# Patient Record
Sex: Female | Born: 1940 | Race: White | Hispanic: No | Marital: Single | Smoking: Never smoker
Health system: Southern US, Community
[De-identification: ages and names within clinical notes are randomized; demographics above are authoritative.]

## PROBLEM LIST (undated history)

## (undated) DIAGNOSIS — D649 Anemia, unspecified: Secondary | ICD-10-CM

## (undated) DIAGNOSIS — A0472 Enterocolitis due to Clostridium difficile, not specified as recurrent: Secondary | ICD-10-CM

## (undated) DIAGNOSIS — D126 Benign neoplasm of colon, unspecified: Secondary | ICD-10-CM

## (undated) DIAGNOSIS — I10 Essential (primary) hypertension: Secondary | ICD-10-CM

## (undated) DIAGNOSIS — K589 Irritable bowel syndrome without diarrhea: Secondary | ICD-10-CM

## (undated) DIAGNOSIS — K222 Esophageal obstruction: Secondary | ICD-10-CM

## (undated) DIAGNOSIS — H269 Unspecified cataract: Secondary | ICD-10-CM

## (undated) DIAGNOSIS — A048 Other specified bacterial intestinal infections: Secondary | ICD-10-CM

## (undated) DIAGNOSIS — K52831 Collagenous colitis: Secondary | ICD-10-CM

## (undated) DIAGNOSIS — K449 Diaphragmatic hernia without obstruction or gangrene: Secondary | ICD-10-CM

## (undated) DIAGNOSIS — K219 Gastro-esophageal reflux disease without esophagitis: Secondary | ICD-10-CM

## (undated) DIAGNOSIS — E538 Deficiency of other specified B group vitamins: Secondary | ICD-10-CM

## (undated) DIAGNOSIS — IMO0002 Reserved for concepts with insufficient information to code with codable children: Secondary | ICD-10-CM

## (undated) DIAGNOSIS — K5790 Diverticulosis of intestine, part unspecified, without perforation or abscess without bleeding: Secondary | ICD-10-CM

## (undated) DIAGNOSIS — D509 Iron deficiency anemia, unspecified: Secondary | ICD-10-CM

## (undated) DIAGNOSIS — T7840XA Allergy, unspecified, initial encounter: Secondary | ICD-10-CM

## (undated) HISTORY — DX: Benign neoplasm of colon, unspecified: D12.6

## (undated) HISTORY — PX: EYE SURGERY: SHX253

## (undated) HISTORY — PX: BLADDER SUSPENSION: SHX72

## (undated) HISTORY — DX: Deficiency of other specified B group vitamins: E53.8

## (undated) HISTORY — DX: Esophageal obstruction: K22.2

## (undated) HISTORY — DX: Reserved for concepts with insufficient information to code with codable children: IMO0002

## (undated) HISTORY — DX: Anemia, unspecified: D64.9

## (undated) HISTORY — DX: Irritable bowel syndrome, unspecified: K58.9

## (undated) HISTORY — PX: VAGINAL HYSTERECTOMY: SHX2639

## (undated) HISTORY — DX: Allergy, unspecified, initial encounter: T78.40XA

## (undated) HISTORY — DX: Diaphragmatic hernia without obstruction or gangrene: K44.9

## (undated) HISTORY — DX: Iron deficiency anemia, unspecified: D50.9

## (undated) HISTORY — DX: Gastro-esophageal reflux disease without esophagitis: K21.9

## (undated) HISTORY — DX: Diverticulosis of intestine, part unspecified, without perforation or abscess without bleeding: K57.90

## (undated) HISTORY — DX: Collagenous colitis: K52.831

## (undated) HISTORY — PX: CHOLECYSTECTOMY: SHX55

## (undated) HISTORY — DX: Other specified bacterial intestinal infections: A04.8

## (undated) HISTORY — DX: Unspecified cataract: H26.9

## (undated) HISTORY — PX: APPENDECTOMY: SHX54

---

## 1986-07-23 DIAGNOSIS — K222 Esophageal obstruction: Secondary | ICD-10-CM

## 1986-07-23 HISTORY — DX: Esophageal obstruction: K22.2

## 1998-01-04 ENCOUNTER — Ambulatory Visit (HOSPITAL_COMMUNITY): Admission: RE | Admit: 1998-01-04 | Discharge: 1998-01-04 | Payer: Self-pay | Admitting: Gastroenterology

## 1998-07-19 ENCOUNTER — Other Ambulatory Visit: Admission: RE | Admit: 1998-07-19 | Discharge: 1998-07-19 | Payer: Self-pay | Admitting: Family Medicine

## 2000-10-15 ENCOUNTER — Other Ambulatory Visit: Admission: RE | Admit: 2000-10-15 | Discharge: 2000-10-15 | Payer: Self-pay | Admitting: Obstetrics and Gynecology

## 2002-07-23 DIAGNOSIS — K52831 Collagenous colitis: Secondary | ICD-10-CM

## 2002-07-23 HISTORY — DX: Collagenous colitis: K52.831

## 2002-08-06 ENCOUNTER — Encounter (INDEPENDENT_AMBULATORY_CARE_PROVIDER_SITE_OTHER): Payer: Self-pay | Admitting: *Deleted

## 2002-08-06 ENCOUNTER — Ambulatory Visit (HOSPITAL_COMMUNITY): Admission: RE | Admit: 2002-08-06 | Discharge: 2002-08-06 | Payer: Self-pay | Admitting: Gastroenterology

## 2002-11-13 ENCOUNTER — Encounter: Payer: Self-pay | Admitting: Gastroenterology

## 2002-11-13 ENCOUNTER — Encounter: Admission: RE | Admit: 2002-11-13 | Discharge: 2002-11-13 | Payer: Self-pay | Admitting: Gastroenterology

## 2003-12-10 ENCOUNTER — Other Ambulatory Visit: Admission: RE | Admit: 2003-12-10 | Discharge: 2003-12-10 | Payer: Self-pay | Admitting: Obstetrics and Gynecology

## 2004-04-27 ENCOUNTER — Ambulatory Visit (HOSPITAL_COMMUNITY): Admission: RE | Admit: 2004-04-27 | Discharge: 2004-04-27 | Payer: Self-pay | Admitting: Gastroenterology

## 2004-04-27 ENCOUNTER — Encounter (INDEPENDENT_AMBULATORY_CARE_PROVIDER_SITE_OTHER): Payer: Self-pay | Admitting: *Deleted

## 2005-11-12 ENCOUNTER — Encounter: Admission: RE | Admit: 2005-11-12 | Discharge: 2005-11-12 | Payer: Self-pay | Admitting: Gastroenterology

## 2007-05-01 IMAGING — CR DG KNEE 1-2V*R*
2 series · 2 of 2 positions shown · non-contrast
Comparison: none

CLINICAL DATA: Pain.
 RIGHT KNEE ? 2 VIEW:

[view not recorded (1 of 2)]
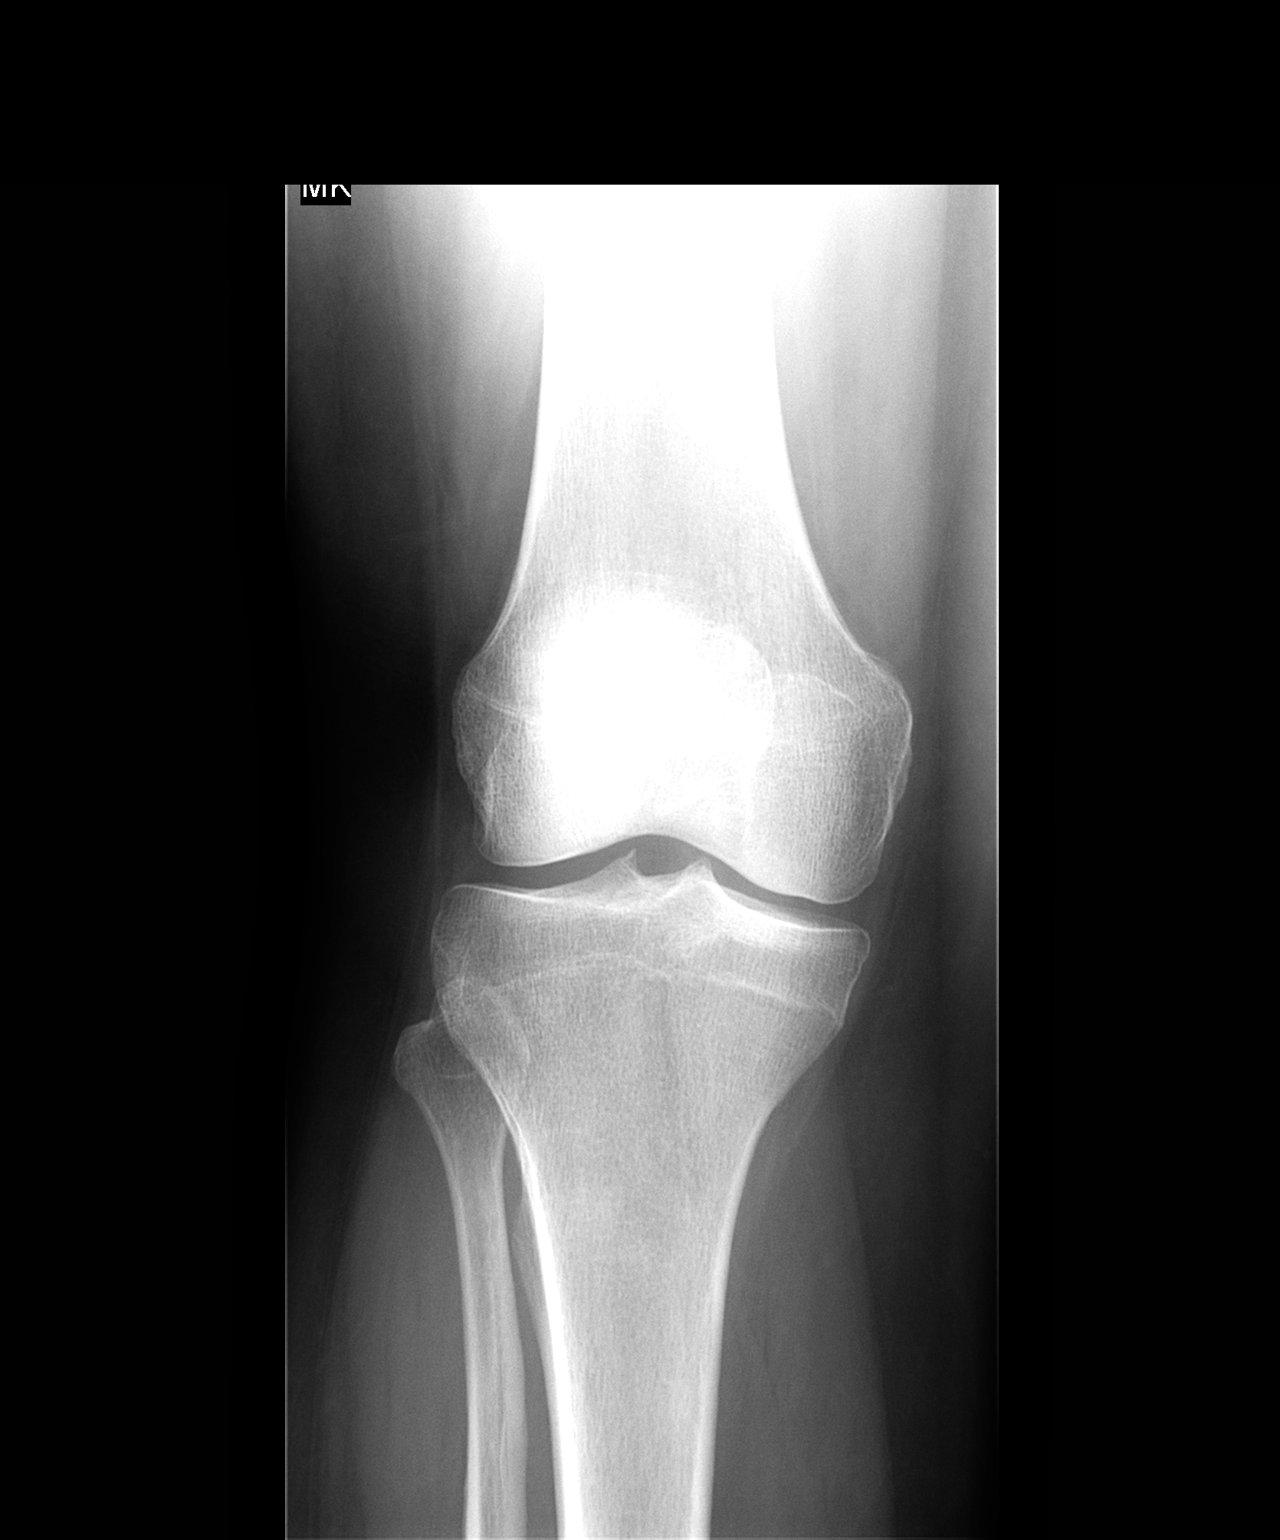

[view not recorded (2 of 2)]
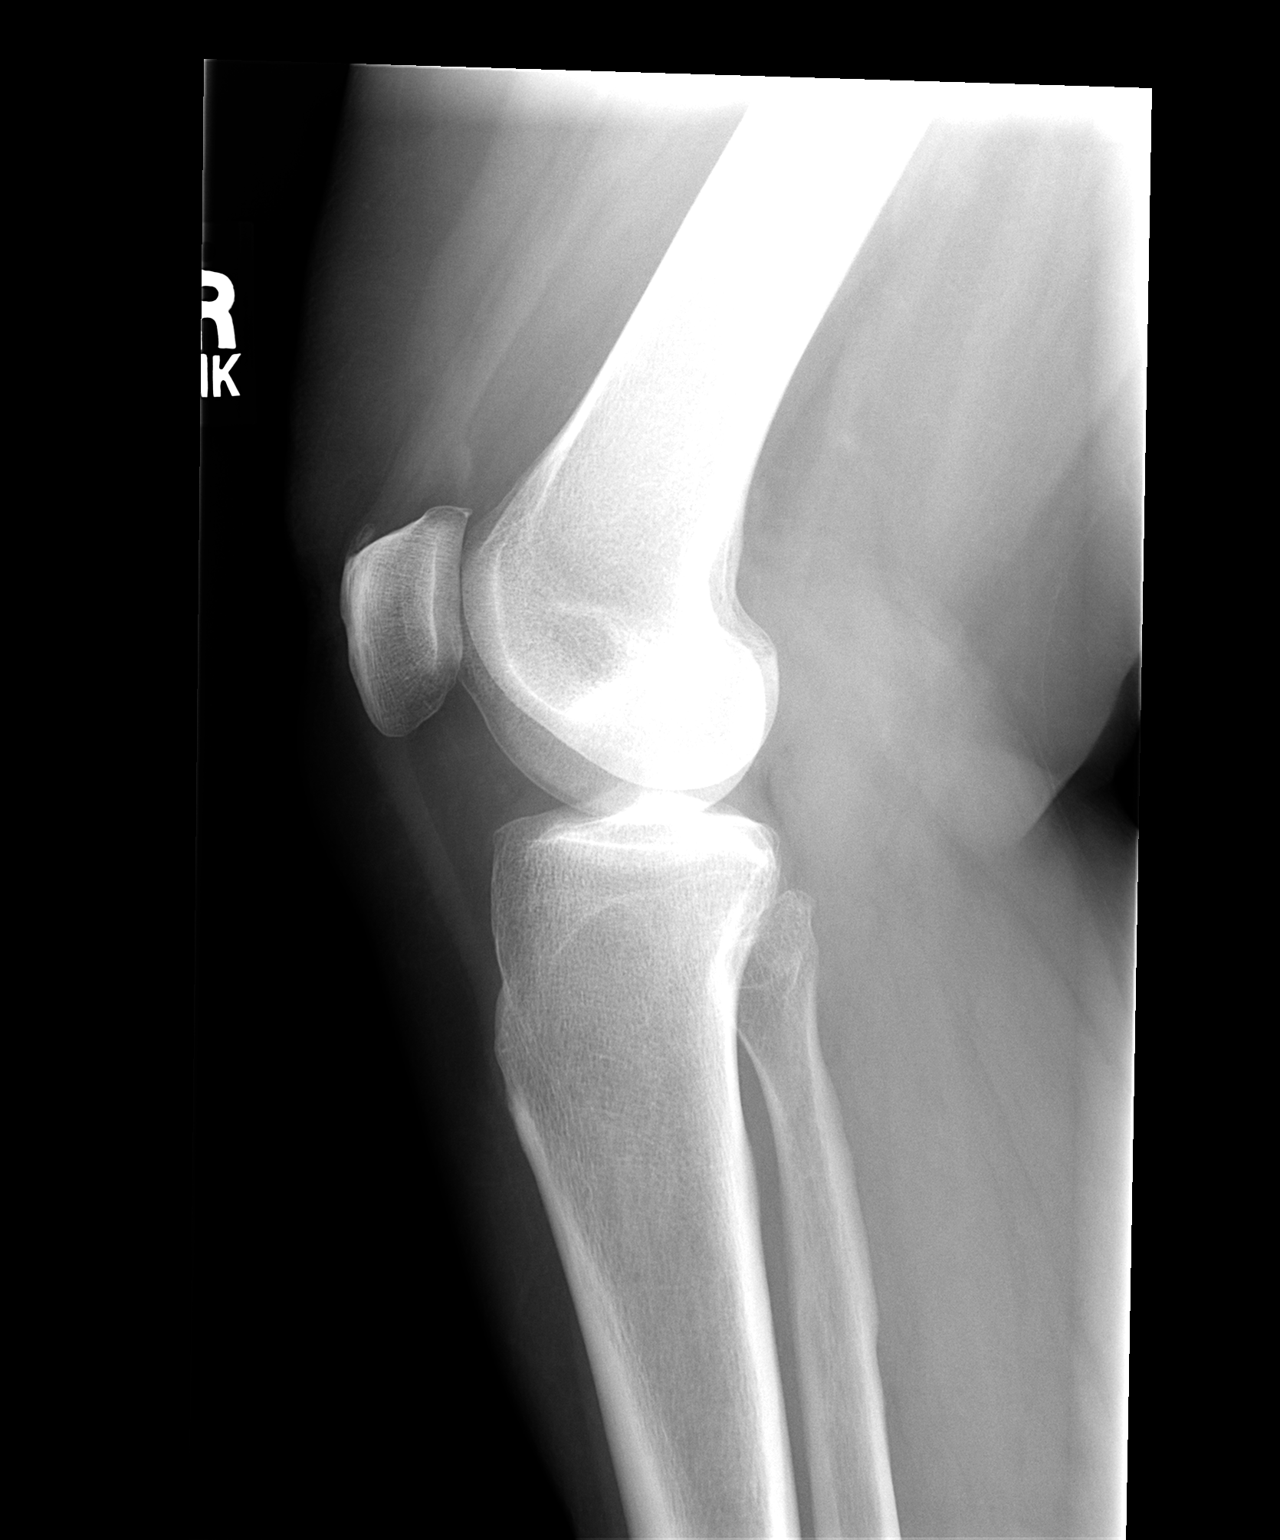

[2 of 2 positions shown; findings below may reference images not displayed]

FINDINGS: The joint space is adequately maintained, and there are no erosive or destructive changes.  There is no evidence for joint effusion.
IMPRESSION: Normal study.

## 2008-12-18 ENCOUNTER — Emergency Department (HOSPITAL_COMMUNITY): Admission: EM | Admit: 2008-12-18 | Discharge: 2008-12-18 | Payer: Self-pay | Admitting: Emergency Medicine

## 2010-08-16 ENCOUNTER — Other Ambulatory Visit: Payer: Self-pay | Admitting: Obstetrics and Gynecology

## 2010-12-08 NOTE — Op Note (Signed)
NAME:  Jasmine Cantu, Jasmine Cantu                            ACCOUNT NO.:  [id number]   MEDICAL RECORD NO.:  [id number]                   PATIENT TYPE:  AMB   LOCATION:  ENDO                                 FACILITY:  [facility]   PHYSICIAN:  Danise Edge, M.D.                DATE OF BIRTH:  [birth year]   DATE OF PROCEDURE:  08/06/2002  DATE OF DISCHARGE:                                 OPERATIVE REPORT   PROCEDURE PERFORMED:  Colonoscopy.   ENDOSCOPIST:  Charolett Bumpers, M.D.   INDICATIONS FOR PROCEDURE:  The patient is a 70 year old female born  1940/10/02.  The patient has diarrhea predominant irritable bowel  syndrome and is post cholecystectomy.  Her mother had colon cancer.  [name] last underwent a screening colonoscopy in 1999.  She has never required  removal of colon cancer or neoplastic colon polyps.  She is due for a  screening colonoscopy with polypectomy to prevent colon cancer.  She does  experience intermittent right upper quadrant abdominal discomfort.  Her  diarrhea predominant irritable bowel syndrome has recently become more  constipated and has more constipated bowel movements.  She denies  gastrointestinal bleeding.   PREMEDICATION:  Demerol 50 mg, Versed 5 mg.   INSTRUMENT USED:  Pediatric Olympus colonoscope.   DESCRIPTION OF PROCEDURE:  After obtaining informed consent, the patient was  placed in the left lateral decubitus position.  I administered intravenous  Demerol and intravenous Versed to achieve conscious sedation for the  procedure.  The patient's blood pressure, oxygen saturations and cardiac  rhythm were monitored throughout the procedure and documented in the medical  record.   Anal inspection was normal.  Digital rectal exam was normal.  The pediatric  Olympus video colonoscope was introduced into the rectum and advanced to the  cecum.  Colonic preparation for the exam today was excellent.   Rectum:  Normal.   Sigmoid colon and descending  colon:  Extensive left colonic diverticulosis.   Splenic flexure:  Normal.   Transverse colon:  Normal.   Hepatic flexure:  Normal.   Ascending colon:  Normal.   Cecum and ileocecal valve:  Normal.   Biopsies:  Random colonic biopsies were taken from the right colon to rule  out microscopic-collagenous colitis.   ASSESSMENT:  Left colonic diverticulosis; otherwise, normal  proctocolonoscopy to the cecum.  No endoscopic evidence for the presence of  colorectal neoplasia.   RECOMMENDATIONS:  Repeat screening colonoscopy with polypectomy to prevent  colon cancer in five years.                                                 Danise Edge, M.D.    MJ/MEDQ  D:  08/06/2002  T:  08/06/2002  Job:  [id number]  

## 2010-12-08 NOTE — Op Note (Signed)
NAMEJOCABED, CHEESE                  ACCOUNT NO.:  [id number]   MEDICAL RECORD NO.:  [id number]          PATIENT TYPE:  AMB   LOCATION:  ENDO                         FACILITY:  [facility]   PHYSICIAN:  Danise Edge, M.D.   DATE OF BIRTH:  1940/10/30   DATE OF PROCEDURE:  04/27/2004  DATE OF DISCHARGE:                                 OPERATIVE REPORT   PROCEDURE:  Esophagogastroduodenoscopy.   INDICATIONS:  [name] is a 70 year old female born May 18, 1941.  [name] has guaiac positive stool and iron-deficiency anemia based on a  hemoglobin 10.4, serum ferritin 6.8 nanograms/mL, iron saturation 8%.   In 1988, 1994, 1999, and 2004, screening proctocolonoscopy to the cecum did  not reveal colonic neoplasia. 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   [name] required upper endoscopy and esophageal dilation of a benign peptic  stricture in 1988 and in 1997. There is no history of Barrett's esophagus.   In 1992, an upper GI small-bowel follow through x-ray series was normal  except for the presence of a hiatal hernia.   [name] admitted stool cards for hemoccult testing March 21, 2004; 6 of 6  stool card windows were positive for blood.   ENDOSCOPIST:  Danise Edge, M.D.   PREMEDICATION:  Versed 7.5 mg, Demerol 50 mg.   PROCEDURE NOTE:  After obtaining informed consent, [name] was placed in  the left lateral decubitus position. I administered intravenous Demerol and  intravenous Versed to achieve conscious sedation for the procedure. The  patient's blood pressure, oxygen saturation, and cardiac rhythm were  monitored throughout the procedure and documented in the medical record.   The Olympus gastric scope was passed through the posterior hypopharynx into  the proximal esophagus without difficulty. Hypopharynx, larynx, and vocal  cords appeared normal.   Esophagoscopy:  The proximal mid and lower segments of the esophageal mucosa  appeared  normal except for the presence of a nonobstructing benign peptic  stricture localized to the esophagogastric junction which is noted at 30 cm  from the incisor teeth. There is no endoscopic evidence for the presence of  Barrett's esophagus or erosive esophagitis.   Gastroscopy:  [name] diaphragmatic hiatus is noted at 35 cm from the  incisor teeth. She does have a large hiatal hernia. 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 An erosion with some depth to it was biopsied. There is no active  bleeding or visible vessels present. The diaphragmatic hiatus is quite  patulous. The gastric body, antrum, and pylorus appeared normal.   Duodenoscopy:  The duodenal bulb, mid duodenum, and distal duodenum appear  completely normal.   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 An erosion that appeared to have some depth to it was  biopsied but certainly does not look malignant.   PLAN:  [name] will start her iron gluconate; she was unable to tolerate  iron  sulfate. She will remain on her protein pump inhibitor.      MJ/MEDQ  D:  04/27/2004  T:  04/27/2004  Job:  [id number]

## 2012-03-11 ENCOUNTER — Other Ambulatory Visit: Payer: Self-pay | Admitting: Gastroenterology

## 2012-05-31 ENCOUNTER — Ambulatory Visit (INDEPENDENT_AMBULATORY_CARE_PROVIDER_SITE_OTHER): Payer: Medicare Other | Admitting: Emergency Medicine

## 2012-05-31 VITALS — BP 136/94 | HR 82 | Temp 98.1°F | Resp 18 | Ht 63.0 in | Wt 190.0 lb

## 2012-05-31 DIAGNOSIS — J309 Allergic rhinitis, unspecified: Secondary | ICD-10-CM

## 2012-05-31 DIAGNOSIS — IMO0001 Reserved for inherently not codable concepts without codable children: Secondary | ICD-10-CM

## 2012-05-31 DIAGNOSIS — J329 Chronic sinusitis, unspecified: Secondary | ICD-10-CM

## 2012-05-31 DIAGNOSIS — K219 Gastro-esophageal reflux disease without esophagitis: Secondary | ICD-10-CM

## 2012-05-31 DIAGNOSIS — J029 Acute pharyngitis, unspecified: Secondary | ICD-10-CM

## 2012-05-31 LAB — POCT RAPID STREP A (OFFICE): Rapid Strep A Screen: NEGATIVE

## 2012-05-31 MED ORDER — AMOXICILLIN 875 MG PO TABS: 875.0000 mg | ORAL_TABLET | Freq: Two times a day (BID) | ORAL | Status: DC

## 2012-05-31 MED ORDER — PREDNISONE 10 MG PO TABS: ORAL_TABLET | ORAL | Status: DC

## 2012-05-31 MED ORDER — FLUTICASONE PROPIONATE 50 MCG/ACT NA SUSP: 2.0000 | Freq: Every day | NASAL | Status: DC

## 2012-05-31 NOTE — Progress Notes (Signed)
Pt presents to clinic with cold like symptoms for about a week. Pt states she does not have a productive cough but does feel drainage down the back of her throat. She also states her teeth are also a little sore. She reports taking Claritin 5mg  and Tylenol this past week. She states that the her husband is suffering from the same type symptoms at home. She is feeling a good bit of facial pressure he is also hurting in both of her cheeks .       Objective  HEENT exam reveals TMs to be clear nose congested throat normal except for redness of the posterior pharynx chest was clear to auscultation and percussion. Ingestion with some purulence noted on examination.   No results found for this or any previous visit.  Results for orders placed in visit on 05/31/12  POCT RAPID STREP A (OFFICE)      Component Value Range   Rapid Strep A Screen Negative  Negative    Assessment and plan  Patient here with bad sore throat and evidence of sinusitis. We'll treat with Flonase spray and amoxicillin.

## 2012-05-31 NOTE — Patient Instructions (Addendum)

## 2012-06-04 ENCOUNTER — Telehealth: Payer: Self-pay

## 2012-06-04 MED ORDER — BENZONATATE 100 MG PO CAPS: 100.0000 mg | ORAL_CAPSULE | Freq: Three times a day (TID) | ORAL | Status: DC | PRN

## 2012-06-04 NOTE — Telephone Encounter (Signed)
Patient states Dr Cleta Alberts did not give her this, was given by Dr Laural Benes her PCP gave to her about a year ago. Wants to know if we can give this to her. She is taking the Amox and the nasal spray.

## 2012-06-04 NOTE — Telephone Encounter (Signed)
Pt called and has a cough and is out of the cough medicine that she had been using since her visit with Dr. Cleta Alberts on 05/31/12.  Would like refill on Tussinex  Hydrocodone suspension.  1 tsp every 12 hours.  Best number [phone].  [facility] [address]

## 2012-06-04 NOTE — Telephone Encounter (Signed)
I do not feel comfortable prescribing that medication, however I will send her in some Tessalon perles.

## 2012-06-04 NOTE — Telephone Encounter (Signed)
Called patient to advise  °

## 2013-01-03 ENCOUNTER — Ambulatory Visit (INDEPENDENT_AMBULATORY_CARE_PROVIDER_SITE_OTHER): Payer: Medicare Other | Admitting: Family Medicine

## 2013-01-03 VITALS — BP 146/77 | HR 67 | Temp 97.5°F | Resp 16 | Ht 63.0 in | Wt 188.8 lb

## 2013-01-03 DIAGNOSIS — J329 Chronic sinusitis, unspecified: Secondary | ICD-10-CM

## 2013-01-03 MED ORDER — AZITHROMYCIN 250 MG PO TABS: ORAL_TABLET | ORAL | Status: DC

## 2013-01-03 NOTE — Progress Notes (Signed)
Patient ID: Jasmine Cantu MRN: [id number], DOB: Jun 01, 1941, 72 y.o. Date of Encounter: 01/03/2013, 11:19 AM  Primary Physician: Charolett Bumpers, MD  Chief Complaint:  Chief Complaint  Patient presents with  . Sinusitis    x 2 days     HPI: 72 y.o. year old female presents with 5 day history of nasal congestion, post nasal drip, sore throat, sinus pressure, and cough. Afebrile. No chills. Nasal congestion thick and green/yellow. Sinus pressure is the worst symptom. Cough is productive secondary to post nasal drip and not associated with time of day. Ears feel full, leading to sensation of muffled hearing. Has tried OTC cold preps without success. No GI complaints.   No recent antibiotics, recent travels, or sick contacts   No leg trauma, sedentary periods, h/o cancer, or tobacco use.  Past Medical History  Diagnosis Date  . GERD (gastroesophageal reflux disease)   . Cataract   . Ulcer      Home Meds: Prior to Admission medications   Medication Sig Start Date End Date Taking? Authorizing Provider  esomeprazole (NEXIUM) 40 MG capsule Take 40 mg by mouth daily before breakfast.   Yes Historical Provider, MD  fluticasone (FLONASE) 50 MCG/ACT nasal spray Place 2 sprays into the nose daily. 05/31/12  Yes Collene Gobble, MD  zolpidem (AMBIEN) 5 MG tablet Take 5 mg by mouth at bedtime as needed for sleep.   Yes Historical Provider, MD  azithromycin (ZITHROMAX Z-PAK) 250 MG tablet Take as directed on pack 01/03/13   Elvina Sidle, MD    Allergies: No Known Allergies  History   Social History  . Marital Status: Single    Spouse Name: N/A    Number of Children: N/A  . Years of Education: N/A   Occupational History  . Not on file.   Social History Main Topics  . Smoking status: Never Smoker   . Smokeless tobacco: Not on file  . Alcohol Use: No  . Drug Use: No  . Sexually Active: Yes   Other Topics Concern  . Not on file   Social History Narrative  . No narrative on file      Review of Systems: Constitutional: negative for chills, fever, night sweats or weight changes Cardiovascular: negative for chest pain or palpitations Respiratory: negative for hemoptysis, wheezing, or shortness of breath Abdominal: negative for abdominal pain, nausea, vomiting or diarrhea Dermatological: negative for rash Neurologic: negative for headache   Physical Exam: Blood pressure 146/77, pulse 67, temperature 97.5 F (36.4 C), temperature source Oral, resp. rate 16, height 5\' 3"  (1.6 m), weight 188 lb 12.8 oz (85.639 kg), SpO2 99.00%., Body mass index is 33.45 kg/(m^2). General: Well developed, well nourished, in no acute distress. Head: Normocephalic, atraumatic, eyes without discharge, sclera non-icteric, nares are congested. Bilateral auditory canals clear, TM's are without perforation, pearly grey with reflective cone of light bilaterally. Serous effusion bilaterally behind TM's. Maxillary sinus TTP. Oral cavity moist, dentition normal. Posterior pharynx with post nasal drip and mild erythema. No peritonsillar abscess or tonsillar exudate. Neck: Supple. No thyromegaly. Full ROM. No lymphadenopathy. Lungs: Clear bilaterally to auscultation without wheezes, rales, or rhonchi. Breathing is unlabored.  Heart: RRR with S1 S2. No murmurs, rubs, or gallops appreciated. Msk:  Strength and tone normal for age. Extremities: No clubbing or cyanosis. No edema. Neuro: Alert and oriented X 3. Moves all extremities spontaneously. CNII-XII grossly in tact. Psych:  Responds to questions appropriately with a normal affect.    ASSESSMENT AND  PLAN:  72 y.o. year old female with sinusitis Sinusitis - Plan: azithromycin (ZITHROMAX Z-PAK) 250 MG tablet  -Tylenol/Motrin prn -Rest/fluids -RTC precautions -RTC 3-5 days if no improvement  Signed, Elvina Sidle, MD 01/03/2013 11:19 AM

## 2013-01-03 NOTE — Patient Instructions (Addendum)

## 2013-04-30 ENCOUNTER — Ambulatory Visit (INDEPENDENT_AMBULATORY_CARE_PROVIDER_SITE_OTHER): Payer: Medicare Other | Admitting: Family Medicine

## 2013-04-30 VITALS — BP 130/80 | HR 74 | Temp 98.1°F | Resp 16 | Ht 62.5 in | Wt 188.2 lb

## 2013-04-30 DIAGNOSIS — J309 Allergic rhinitis, unspecified: Secondary | ICD-10-CM

## 2013-04-30 DIAGNOSIS — H1013 Acute atopic conjunctivitis, bilateral: Secondary | ICD-10-CM

## 2013-04-30 DIAGNOSIS — H1010 Acute atopic conjunctivitis, unspecified eye: Secondary | ICD-10-CM

## 2013-04-30 MED ORDER — CETIRIZINE HCL 10 MG PO TABS: 10.0000 mg | ORAL_TABLET | Freq: Every day | ORAL | Status: DC

## 2013-04-30 MED ORDER — FLUTICASONE PROPIONATE 50 MCG/ACT NA SUSP: 2.0000 | Freq: Every day | NASAL | Status: DC

## 2013-04-30 MED ORDER — ALCAFTADINE 0.25 % OP SOLN: 1.0000 [drp] | Freq: Every day | OPHTHALMIC | Status: DC

## 2013-04-30 MED ORDER — METHYLPREDNISOLONE ACETATE 80 MG/ML IJ SUSP
80.0000 mg | Freq: Once | INTRAMUSCULAR | Status: AC
  Administered 2013-04-30: 80 mg via INTRAMUSCULAR

## 2013-04-30 NOTE — Progress Notes (Signed)
Subjective:    Patient ID: Jasmine Cantu, female    DOB: Feb 16, 1941, 72 y.o.   MRN: [id number] Chief Complaint  Patient presents with  . allergies issues    both redness, sneezing, ear pain x1 week  . Ear Fullness    HPI  For the past 4-5d has had severe symptoms but has been consistent over the summer.  Eye itching has been bothering her the most - have been red and tearing.  The tearing has made it difficult to read. Used "lastacraft" rx for eye itching and articifical tears with some relief but ran out of the former.  Has had some nasal congestion and eyes are puffy in the morning.  Has had some left upper jaw pain as well which she attributes to sinus irritation though not much sinus pressure. No f/c.  No nosebleed. No PND or throat problems.  Occ dry cough.  Sleeping all right though has to get up 2-3x/night for nocturia. Has not been using any other otc meds.  No f/c Can't use claritin as it dries her eyes out to much, does do ok on zyrtec or allergra and has at home but hasn't tried.  Has some flonase at home but not using.  Past Medical History  Diagnosis Date  . GERD (gastroesophageal reflux disease)   . Cataract   . Ulcer   . Allergy    Current Outpatient Prescriptions on File Prior to Visit  Medication Sig Dispense Refill  . esomeprazole (NEXIUM) 40 MG capsule Take 40 mg by mouth daily before breakfast.      . zolpidem (AMBIEN) 5 MG tablet Take 5 mg by mouth at bedtime as needed for sleep.      . fluticasone (FLONASE) 50 MCG/ACT nasal spray Place 2 sprays into the nose daily.  16 g  6   No current facility-administered medications on file prior to visit.   Allergies  Allergen Reactions  . Codeine Other (See Comments)    GI upset     Review of Systems  Constitutional: Positive for fatigue. Negative for fever, chills, diaphoresis, activity change and appetite change.  HENT: Positive for congestion, ear pain, rhinorrhea and sneezing. Negative for ear discharge, nosebleeds,  postnasal drip, sinus pressure, sore throat, trouble swallowing and voice change.   Eyes: Positive for discharge, redness, itching and visual disturbance. Negative for photophobia and pain.  Respiratory: Positive for cough. Negative for shortness of breath.   Cardiovascular: Negative for chest pain.  Gastrointestinal: Negative for nausea, vomiting and abdominal pain.  Musculoskeletal: Negative for neck pain and neck stiffness.  Skin: Negative for rash.  Neurological: Positive for headaches. Negative for dizziness and syncope.  Hematological: Negative for adenopathy.  Psychiatric/Behavioral: Positive for sleep disturbance.      BP 130/80  Pulse 74  Temp(Src) 98.1 F (36.7 C) (Oral)  Resp 16  Ht 5' 2.5" (1.588 m)  Wt 188 lb 3.2 oz (85.367 kg)  BMI 33.85 kg/m2  SpO2 97% Objective:   Physical Exam  Constitutional: She is oriented to person, place, and time. She appears well-developed and well-nourished. She does not appear ill. No distress.  HENT:  Head: Normocephalic and atraumatic.  Right Ear: External ear and ear canal normal.  Left Ear: External ear and ear canal normal. Tympanic membrane is not injected.  No middle ear effusion.  Nose: Mucosal edema and rhinorrhea present.  Mouth/Throat: Uvula is midline and mucous membranes are normal. Posterior oropharyngeal edema present. No oropharyngeal exudate, posterior oropharyngeal erythema or tonsillar  abscesses.  Unable to visualize right TM due to cerumen occlusion  Eyes: EOM are normal. Pupils are equal, round, and reactive to light. Right eye exhibits discharge. Right eye exhibits no chemosis and no exudate. Left eye exhibits discharge. Left eye exhibits no chemosis and no exudate. Right conjunctiva is injected. Right conjunctiva has no hemorrhage. Left conjunctiva is injected. Left conjunctiva has no hemorrhage. No scleral icterus.  Bilateral eyes mildly diffusely red with increased clear tearing and discharge  Neck: Normal range of  motion. Neck supple.  Pulmonary/Chest: Effort normal.  Lymphadenopathy:       Head (right side): No submandibular, no preauricular and no posterior auricular adenopathy present.       Head (left side): No submandibular, no preauricular and no posterior auricular adenopathy present.    She has no cervical adenopathy.       Right: No supraclavicular adenopathy present.       Left: No supraclavicular adenopathy present.  Neurological: She is alert and oriented to person, place, and time.  Skin: Skin is warm and dry. She is not diaphoretic. No erythema.  Psychiatric: She has a normal mood and affect. Her behavior is normal.      Assessment & Plan:  Allergic conjunctivitis and rhinitis, bilateral - Plan: methylPREDNISolone acetate (DEPO-MEDROL) injection 80 mg  Allergic rhinitis - Plan: fluticasone (FLONASE) 50 MCG/ACT nasal spray, DISCONTINUED: fluticasone (FLONASE) 50 MCG/ACT nasal spray Pt really wants a course of prednisone to treat sxs more immed. She is leaving for the beach next wk and wants to feel well now. Side effects of immunosuppression, osteopenia, and diabetes. Pt will RTC if she worsens with illness of f/c, etc.  Important of daily antihistamines with eye drops and zyrtec reviewed for prevention.  Meds ordered this encounter  Medications  . Alcaftadine 0.25 % SOLN    Sig: Apply 1 drop to eye daily.    Dispense:  1 Bottle    Refill:  5  . cetirizine (ZYRTEC) 10 MG tablet    Sig: Take 1 tablet (10 mg total) by mouth daily.    Dispense:  30 tablet    Refill:  11  . DISCONTD: fluticasone (FLONASE) 50 MCG/ACT nasal spray    Sig: Place 2 sprays into the nose daily.    Dispense:  16 g    Refill:  6  . fluticasone (FLONASE) 50 MCG/ACT nasal spray    Sig: Place 2 sprays into the nose daily.    Dispense:  16 g    Refill:  6  . methylPREDNISolone acetate (DEPO-MEDROL) injection 80 mg    Sig:

## 2013-04-30 NOTE — Patient Instructions (Signed)
Hay Fever Hay fever is an allergic reaction to particles in the air. It cannot be passed from person to person. It cannot be cured, but it can be controlled. CAUSES  Hay fever is caused by something that triggers an allergic reaction (allergens). The following are examples of allergens:  Ragweed.  Feathers.  Animal dander.  Grass and tree pollens.  Cigarette smoke.  House dust.  Pollution. SYMPTOMS   Sneezing.  Runny or stuffy nose.  Tearing eyes.  Itchy eyes, nose, mouth, throat, skin, or other area.  Sore throat.  Headache.  Decreased sense of smell or taste. DIAGNOSIS Your caregiver will perform a physical exam and ask questions about the symptoms you are having.Allergy testing may be done to determine exactly what triggers your hay fever.  TREATMENT   Over-the-counter medicines may help symptoms. These include:  Antihistamines.  Decongestants. These may help with nasal congestion.  Your caregiver may prescribe medicines if over-the-counter medicines do not work.  Some people benefit from allergy shots when other medicines are not helpful. HOME CARE INSTRUCTIONS   Avoid the allergen that is causing your symptoms, if possible.  Take all medicine as told by your caregiver. SEEK MEDICAL CARE IF:   You have severe allergy symptoms and your current medicines are not helping.  Your treatment was working at one time, but you are now experiencing symptoms.  You have sinus congestion and pressure.  You develop a fever or headache.  You have thick nasal discharge.  You have asthma and have a worsening cough and wheezing. SEEK IMMEDIATE MEDICAL CARE IF:   You have swelling of your tongue or lips.  You have trouble breathing.  You feel lightheaded or like you are going to faint.  You have cold sweats.  You have a fever. Document Released: 07/09/2005 Document Revised: 10/01/2011 Document Reviewed: 10/04/2010 [employer] Patient Information 2014  [employer]  

## 2014-02-22 ENCOUNTER — Ambulatory Visit (INDEPENDENT_AMBULATORY_CARE_PROVIDER_SITE_OTHER): Payer: Medicare Other | Admitting: Family Medicine

## 2014-02-22 ENCOUNTER — Ambulatory Visit (INDEPENDENT_AMBULATORY_CARE_PROVIDER_SITE_OTHER): Payer: Medicare Other

## 2014-02-22 VITALS — BP 128/70 | HR 99 | Temp 97.8°F | Resp 18 | Ht 63.0 in | Wt 187.0 lb

## 2014-02-22 DIAGNOSIS — M546 Pain in thoracic spine: Secondary | ICD-10-CM

## 2014-02-22 DIAGNOSIS — T148XXA Other injury of unspecified body region, initial encounter: Secondary | ICD-10-CM

## 2014-02-22 MED ORDER — METHYLPREDNISOLONE (PAK) 4 MG PO TABS: ORAL_TABLET | ORAL | Status: DC

## 2014-02-22 MED ORDER — CYCLOBENZAPRINE HCL 5 MG PO TABS: 5.0000 mg | ORAL_TABLET | Freq: Three times a day (TID) | ORAL | Status: DC | PRN

## 2014-02-22 MED ORDER — HYDROCODONE-ACETAMINOPHEN 10-325 MG PO TABS: 1.0000 | ORAL_TABLET | Freq: Three times a day (TID) | ORAL | Status: DC | PRN

## 2014-02-22 NOTE — Progress Notes (Signed)
Chief Complaint:  Chief Complaint  Patient presents with  . Back Pain    x 2-3 weeks    HPI: Jasmine Cantu is a 73 y.o. female who is here for  2-3 week hx of mid back pain that radiate to bialteral shoulders, more on right side.  No urinary sxs. No increase freq or urgency or pain with urination.  Tight m has taken ibuprofen without relief . Tried tylenol, tried heat and ice without relief. When she lays down it does not bother her.  No osteoprosis or osteopenia. She has had a complete physical and all is fine.  deneis any numbness or tinglgin. HAs tried norco in the past and also flexeril without problems She does not think robaxon or tramadol helepd her.    Past Medical History  Diagnosis Date  . GERD (gastroesophageal reflux disease)   . Cataract   . Ulcer   . Allergy    Past Surgical History  Procedure Laterality Date  . Appendectomy    . Cholecystectomy    . Eye surgery    . Abdominal hysterectomy     History   Social History  . Marital Status: Single    Spouse Name: N/A    Number of Children: N/A  . Years of Education: N/A   Social History Main Topics  . Smoking status: Never Smoker   . Smokeless tobacco: None  . Alcohol Use: No  . Drug Use: No  . Sexual Activity: Yes   Other Topics Concern  . None   Social History Narrative  . None   Family History  Problem Relation Age of Onset  . Cancer Mother   . Diabetes Mother   . Stroke Father   . Cancer Sister   . Cancer Brother   . Heart attack Brother   . Cancer Brother   . Heart attack Brother    Allergies  Allergen Reactions  . Codeine Other (See Comments)    GI upset   Prior to Admission medications   Medication Sig Start Date End Date Taking? Authorizing Provider  Alcaftadine 0.25 % SOLN Apply 1 drop to eye daily. 04/30/13  Yes Shawnee Knapp, MD  esomeprazole (NEXIUM) 40 MG capsule Take 40 mg by mouth daily before breakfast.   Yes Historical Provider, MD  fluticasone (FLONASE) 50 MCG/ACT  nasal spray Place 2 sprays into the nose daily. 04/30/13  Yes Shawnee Knapp, MD  montelukast (SINGULAIR) 10 MG tablet Take 10 mg by mouth at bedtime.   Yes Historical Provider, MD  zolpidem (AMBIEN) 5 MG tablet Take 5 mg by mouth at bedtime as needed for sleep.   Yes Historical Provider, MD     ROS: The patient denies fevers, chills, night sweats, unintentional weight loss, chest pain, palpitations, wheezing, dyspnea on exertion, nausea, vomiting, abdominal pain, dysuria, hematuria, melena  All other systems have been reviewed and were otherwise negative with the exception of those mentioned in the HPI and as above.    PHYSICAL EXAM: Filed Vitals:   02/22/14 1257  BP: 128/70  Pulse: 99  Temp: 97.8 F (36.6 C)  Resp: 18   Filed Vitals:   02/22/14 1257  Height: 5\' 3"  (1.6 m)  Weight: 187 lb (84.823 kg)   Body mass index is 33.13 kg/(m^2).  General: Alert, no acute distress HEENT:  Normocephalic, atraumatic, oropharynx patent. EOMI, PERRLA Cardiovascular:  Regular rate and rhythm, no rubs murmurs or gallops.  No Carotid bruits, radial pulse intact.  No pedal edema.  Respiratory: Clear to auscultation bilaterally.  No wheezes, rales, or rhonchi.  No cyanosis, no use of accessory musculature GI: No organomegaly, abdomen is soft and non-tender, positive bowel sounds.  No masses. Skin: No rashes. Neurologic: Facial musculature symmetric. Psychiatric: Patient is appropriate throughout our interaction. Lymphatic: No cervical lymphadenopathy Musculoskeletal: Gait intact. + paramsk tenderness  Mid back and upper shoulder baldes Full ROM 5/5 strength, 2/2 DTRs No saddle anesthesia Straight leg negative Hip and knee exam--normal   LABS: Results for orders placed in visit on 05/31/12  POCT RAPID STREP A (OFFICE)      Result Value Ref Range   Rapid Strep A Screen Negative  Negative     EKG/XRAY:   Primary read interpreted by Dr. Marin Comment at [facility]. NEg for fracture or  dislocation   ASSESSMENT/PLAN: Encounter Diagnoses  Name Primary?  . Midline thoracic back pain Yes  . Sprain and strain    Rx FLexeril, norco medrol pack F/u prn    Gross sideeffects, risk and benefits, and alternatives of medications d/w patient. Patient is aware that all medications have potential sideeffects and we are unable to predict every sideeffect or drug-drug interaction that may occur.  Calbert Hulsebus, [place], DO 02/22/2014 2:32 PM

## 2014-03-08 ENCOUNTER — Ambulatory Visit (INDEPENDENT_AMBULATORY_CARE_PROVIDER_SITE_OTHER): Payer: Medicare Other | Admitting: Podiatry

## 2014-03-08 ENCOUNTER — Ambulatory Visit (INDEPENDENT_AMBULATORY_CARE_PROVIDER_SITE_OTHER): Payer: Medicare Other

## 2014-03-08 ENCOUNTER — Encounter: Payer: Self-pay | Admitting: Podiatry

## 2014-03-08 VITALS — BP 144/77 | HR 70 | Resp 16 | Ht 63.0 in | Wt 187.0 lb

## 2014-03-08 DIAGNOSIS — M779 Enthesopathy, unspecified: Secondary | ICD-10-CM

## 2014-03-08 DIAGNOSIS — M79671 Pain in right foot: Secondary | ICD-10-CM

## 2014-03-08 DIAGNOSIS — M79609 Pain in unspecified limb: Secondary | ICD-10-CM

## 2014-03-08 DIAGNOSIS — L84 Corns and callosities: Secondary | ICD-10-CM

## 2014-03-08 NOTE — Progress Notes (Signed)
Subjective:     Patient ID: Jasmine Cantu, female   DOB: 08-30-1940, 73 y.o.   MRN: [id number]  Foot Pain   patient states I'm having pain in my right foot and I can't tell whether it's the joint or whether it may be the lesions that I have but it can bother me for at least 6 months   Review of Systems  All other systems reviewed and are negative.      Objective:   Physical Exam  Nursing note and vitals reviewed. Cardiovascular: Intact distal pulses.   Musculoskeletal: Normal range of motion.  Neurological: She is alert.  Skin: Skin is warm and dry.   neurovascular status intact with muscle strength adequate and range of motion subtalar midtarsal joint within normal limits. Patient's found to have small keratotic lesions plantar aspect around the metatarsal and mild inflammation third metatarsophalangeal joint right foot. Digits are well-perfused and arch height is normal     Assessment:     Probable lesion formation creating pain with possible capsulitis    Plan:     H&P and x-ray reviewed. Today I debrided all lesions and advised that this will be done routinely and that we may end up doing a cortisone injection depending on response. Reappoint her recheck

## 2014-03-08 NOTE — Progress Notes (Signed)
   Subjective:    Patient ID: Jasmine Cantu, female    DOB: 1940-09-13, 73 y.o.   MRN: [id number]  HPI Comments: "I have pain underneath the toes"  Patient c/o tenderness plantar forefoot right for a few months. The area has tiny, multiple callused areas. She did have hammer toe surgery in 1994. She wonders if that has anything to do with the pain. Walking makes worse. No home treatment.  Foot Pain Associated symptoms include myalgias.      Review of Systems  Endocrine: Positive for heat intolerance.  Musculoskeletal: Positive for back pain and myalgias.  Hematological: Bruises/bleeds easily.  All other systems reviewed and are negative.      Objective:   Physical Exam        Assessment & Plan:

## 2014-09-15 ENCOUNTER — Encounter: Payer: Self-pay | Admitting: Podiatry

## 2014-09-15 ENCOUNTER — Ambulatory Visit (INDEPENDENT_AMBULATORY_CARE_PROVIDER_SITE_OTHER): Payer: Medicare Other | Admitting: Podiatry

## 2014-09-15 VITALS — BP 167/79 | HR 62 | Resp 16

## 2014-09-15 DIAGNOSIS — L84 Corns and callosities: Secondary | ICD-10-CM | POA: Diagnosis not present

## 2014-12-21 NOTE — Progress Notes (Signed)
Subjective:    Patient ID: Jasmine Cantu, female   DOB: 1941-04-27, 74 y.o.   MRN: [id number]  Foot Pain   patient states I'm having pain in my right foot and I can't tell whether it's the joint or whether it may be the lesions that I have but it can bother me for at least 6 months   Review of Systems  All other systems reviewed and are negative.    Objective:  Physical Exam  Cardiovascular: Intact distal pulses.   Musculoskeletal: Normal range of motion.  Neurological: She is alert.  Skin: Skin is warm and dry.  Nursing note and vitals reviewed.  neurovascular status intact with muscle strength adequate and range of motion subtalar midtarsal joint within normal limits. Patient's found to have small keratotic lesions plantar aspect around the metatarsal and mild inflammation third metatarsophalangeal joint right foot. Digits are well-perfused and arch height is normal   Assessment:    Probable lesion formation creating pain with possible capsulitis  Plan:    H&P and x-ray reviewed. Today I debrided all lesions and advised that this will be done routinely and that we may end up doing a cortisone injection depending on response. Reappoint her recheck

## 2015-03-24 ENCOUNTER — Other Ambulatory Visit: Payer: Self-pay | Admitting: Obstetrics & Gynecology

## 2015-03-25 LAB — CYTOLOGY - PAP

## 2015-04-16 ENCOUNTER — Ambulatory Visit (INDEPENDENT_AMBULATORY_CARE_PROVIDER_SITE_OTHER): Payer: Medicare Other | Admitting: Family Medicine

## 2015-04-16 VITALS — BP 128/84 | HR 60 | Temp 98.0°F | Resp 14 | Ht 63.5 in | Wt 185.4 lb

## 2015-04-16 DIAGNOSIS — J302 Other seasonal allergic rhinitis: Secondary | ICD-10-CM

## 2015-04-16 DIAGNOSIS — H6121 Impacted cerumen, right ear: Secondary | ICD-10-CM | POA: Diagnosis not present

## 2015-04-16 MED ORDER — HYDROCOD POLST-CPM POLST ER 10-8 MG/5ML PO SUER: 5.0000 mL | Freq: Every evening | ORAL | Status: DC | PRN

## 2015-04-16 MED ORDER — ALCAFTADINE 0.25 % OP SOLN: 1.0000 [drp] | Freq: Every day | OPHTHALMIC | Status: DC

## 2015-04-16 MED ORDER — FLUTICASONE PROPIONATE 50 MCG/ACT NA SUSP: 2.0000 | Freq: Every day | NASAL | Status: DC

## 2015-04-16 NOTE — Progress Notes (Signed)
Subjective:  This chart was scribed for Delman Cheadle, MD by [facility], medical scribe at [facility].The patient was seen in exam room 11 and the patient's care was started at 8:39 AM.    Patient ID: Jasmine Cantu, female    DOB: 01/16/1941, 74 y.o.   MRN: [id number] Chief Complaint  Patient presents with  . Cough    Non Productive  . Nasal Congestion  . Chest Congestion   HPI HPI Comments: Joliet Virella is a 74 y.o. female who presents to [facility] complaining a non productive cough, rhinorrhea, nasal congestion, and chest congestion. Rhinorrhea began last week, while the cough, and congestion began 2-3 days ago. Associated eye redness.Tired Claritin, and advil. Tussionex in the past has been helpful in the past.   Past Medical History  Diagnosis Date  . GERD (gastroesophageal reflux disease)   . Cataract   . Ulcer   . Allergy   . Anemia    Current Outpatient Prescriptions on File Prior to Visit  Medication Sig Dispense Refill  . esomeprazole (NEXIUM) 40 MG capsule Take 40 mg by mouth daily before breakfast.    . zolpidem (AMBIEN) 5 MG tablet Take 5 mg by mouth at bedtime as needed for sleep.    . Alcaftadine 0.25 % SOLN Apply 1 drop to eye daily. (Patient not taking: Reported on 04/16/2015) 1 Bottle 5  . cyclobenzaprine (FLEXERIL) 5 MG tablet Take 1 tablet (5 mg total) by mouth 3 (three) times daily as needed for muscle spasms. (Patient not taking: Reported on 04/16/2015) 30 tablet 0  . fluticasone (FLONASE) 50 MCG/ACT nasal spray Place 2 sprays into the nose daily. (Patient not taking: Reported on 04/16/2015) 16 g 6  . HYDROcodone-acetaminophen (NORCO) 10-325 MG per tablet Take 1 tablet by mouth every 8 (eight) hours as needed. Take with stool softener as needed, no other tylenol with this (Patient not taking: Reported on 04/16/2015) 30 tablet 0  . montelukast (SINGULAIR) 10 MG tablet Take 10 mg by mouth at bedtime.     No current  facility-administered medications on file prior to visit.   Allergies  Allergen Reactions  . Codeine Other (See Comments)    GI upset   Review of Systems  HENT: Positive for congestion, postnasal drip and rhinorrhea.   Eyes: Positive for redness.  Respiratory: Positive for cough.   Allergic/Immunologic: Positive for environmental allergies.      Objective:  BP 128/84 mmHg  Pulse 60  Temp(Src) 98 F (36.7 C) (Oral)  Resp 14  Ht 5' 3.5" (1.613 m)  Wt 185 lb 6.4 oz (84.097 kg)  BMI 32.32 kg/m2  SpO2 98% Physical Exam  Constitutional: She is oriented to person, place, and time. She appears well-developed and well-nourished. No distress.  HENT:  Head: Normocephalic and atraumatic.  Left Ear: Tympanic membrane normal.  Nose: Mucosal edema present.  Mouth/Throat: Oropharynx is clear and moist. No posterior oropharyngeal edema or posterior oropharyngeal erythema.  Right TM occluded by cerumen.  Eyes: Pupils are equal, round, and reactive to light.  Neck: Normal range of motion. Neck supple.  Cardiovascular: Normal rate, regular rhythm, S1 normal, S2 normal and normal heart sounds.   No murmur heard. Pulmonary/Chest: Effort normal and breath sounds normal. No respiratory distress. She has no wheezes.  Musculoskeletal: Normal range of motion.  Lymphadenopathy:    She has no cervical adenopathy.  Neurological: She is alert and oriented to person, place, and time.  Skin:  Skin is warm and dry.  Psychiatric: She has a normal mood and affect. Her behavior is normal.  Nursing note and vitals reviewed.     Assessment & Plan:   1. Other seasonal allergic rhinitis   2. Cerumen impaction, right     Meds ordered this encounter  Medications  . chlorpheniramine-HYDROcodone (TUSSIONEX PENNKINETIC ER) 10-8 MG/5ML SUER    Sig: Take 5 mLs by mouth at bedtime as needed for cough.    Dispense:  120 mL    Refill:  0  . fluticasone (FLONASE) 50 MCG/ACT nasal spray    Sig: Place 2 sprays  into both nostrils daily.    Dispense:  16 g    Refill:  6  . Alcaftadine 0.25 % SOLN    Sig: Apply 1 drop to eye daily.    Dispense:  1 Bottle    Refill:  5    I personally performed the services described in this documentation, which was scribed in my presence. The recorded information has been reviewed and considered, and addended by me as needed.  Delman Cheadle, MD MPH   By signing my name below, I, Nadim Abuhashem, attest that this documentation has been prepared under the direction and in the presence of Delman Cheadle, MD.  Electronically Signed: Lora Havens, medical scribe. 04/16/2015, 8:48 AM.

## 2015-06-21 ENCOUNTER — Ambulatory Visit (INDEPENDENT_AMBULATORY_CARE_PROVIDER_SITE_OTHER): Payer: Medicare Other | Admitting: Family Medicine

## 2015-06-21 VITALS — BP 168/78 | HR 76 | Temp 98.3°F | Resp 16 | Ht 63.0 in | Wt 186.0 lb

## 2015-06-21 DIAGNOSIS — R05 Cough: Secondary | ICD-10-CM | POA: Diagnosis not present

## 2015-06-21 DIAGNOSIS — R059 Cough, unspecified: Secondary | ICD-10-CM

## 2015-06-21 DIAGNOSIS — J310 Chronic rhinitis: Secondary | ICD-10-CM

## 2015-06-21 DIAGNOSIS — J329 Chronic sinusitis, unspecified: Secondary | ICD-10-CM | POA: Diagnosis not present

## 2015-06-21 MED ORDER — BENZONATATE 100 MG PO CAPS: 100.0000 mg | ORAL_CAPSULE | Freq: Three times a day (TID) | ORAL | Status: DC | PRN

## 2015-06-21 MED ORDER — AMOXICILLIN 875 MG PO TABS: 875.0000 mg | ORAL_TABLET | Freq: Two times a day (BID) | ORAL | Status: DC

## 2015-06-21 NOTE — Progress Notes (Signed)
Patient ID: Jasmine Cantu, female    DOB: 09-14-1940  Age: 74 y.o. MRN: [id number]  Chief Complaint  Patient presents with  . Sinus Problem    x 4 days    Subjective:   74 year old lady who is here with a for 5 day history of an upper respiratory infection. She started initially having some pain in her left upper gum in an area that doesn't have any teeth. She said it wasn't from a dental issue, but she thought maybe it was from her sinus hurting her. Then she developed more head congestion and facial pain and cough. She has been blowing and coughing yellow phlegm. She does not smoke. Her husband has not been ill. She gets sinusitis about once a year. She is generally pretty healthy, sees a gastroenterologist, no other regular occasions other than what he has run. She has taken some Tussionex that she had at home. She also has used a prescription from earlier this year for fluticasone. She is allergic to codeine.   Current allergies, medications, problem list, past/family and social histories reviewed.  Objective:  BP 168/78 mmHg  Pulse 76  Temp(Src) 98.3 F (36.8 C)  Resp 16  Ht 5\' 3"  (1.6 m)  Wt 186 lb (84.369 kg)  BMI 32.96 kg/m2  Pleasant lady, coughing, no major distress. TMs are partially occluded by cerumen but okay. Her sinuses are nontender. Throat minimally erythematous with some appearance of postnasal drainage with some red streaks. Neck supple without significant nodes. Chest is clear to auscultation. Heart regular without murmurs.  Assessment & Plan:   Assessment: 1. Rhinosinusitis   2. Cough       Plan: Suspicious for a rhinosinusitis, will go ahead and place on an antibiotic and cough medication. Continue using her fluticasone spray  Meds ordered this encounter  Medications  . benzonatate (TESSALON) 100 MG capsule    Sig: Take 1-2 capsules (100-200 mg total) by mouth 3 (three) times daily as needed.    Dispense:  30 capsule    Refill:  0  . amoxicillin (AMOXIL)  875 MG tablet    Sig: Take 1 tablet (875 mg total) by mouth 2 (two) times daily.    Dispense:  20 tablet    Refill:  0         Patient Instructions  Drink plenty of fluids and get enough rest  Take the amoxicillin 875 one twice daily with breakfast and supper  Use the fluticasone nose spray 2 sprays each nostril twice daily for 3 days, then decrease to once daily  Continue using the Tussionex if needed at bedtime  Take the benzonatate cough pills one or 2 pills 3 times daily as needed for daytime cough  Tylenol or ibuprofen if needed for pain or fever  Return if worse  Patient is urged to get a flu shot sometime in the very near future.     Return if symptoms worsen or fail to improve.   Jasmine Darley, MD 06/21/2015

## 2015-06-21 NOTE — Patient Instructions (Addendum)
Drink plenty of fluids and get enough rest  Take the amoxicillin 875 one twice daily with breakfast and supper  Use the fluticasone nose spray 2 sprays each nostril twice daily for 3 days, then decrease to once daily  Continue using the Tussionex if needed at bedtime  Take the benzonatate cough pills one or 2 pills 3 times daily as needed for daytime cough  Tylenol or ibuprofen if needed for pain or fever  Return if worse  Patient is urged to get a flu shot sometime in the very near future.  Check to make sure blood pressure comes down

## 2015-11-30 ENCOUNTER — Ambulatory Visit: Payer: Medicare Other | Admitting: Allergy and Immunology

## 2017-02-19 ENCOUNTER — Encounter: Payer: Self-pay | Admitting: Internal Medicine

## 2017-02-28 ENCOUNTER — Ambulatory Visit
Admission: RE | Admit: 2017-02-28 | Discharge: 2017-02-28 | Disposition: A | Discharge status: Home / Self Care | Payer: Medicare Other | Source: Ambulatory Visit | Attending: Internal Medicine | Admitting: Internal Medicine

## 2017-02-28 ENCOUNTER — Other Ambulatory Visit: Payer: Self-pay | Admitting: Internal Medicine

## 2017-02-28 DIAGNOSIS — M7989 Other specified soft tissue disorders: Secondary | ICD-10-CM

## 2017-04-22 ENCOUNTER — Ambulatory Visit: Payer: Self-pay | Admitting: Internal Medicine

## 2017-05-03 ENCOUNTER — Encounter: Payer: Self-pay | Admitting: *Deleted

## 2017-05-10 ENCOUNTER — Encounter: Payer: Self-pay | Admitting: *Deleted

## 2017-05-15 ENCOUNTER — Ambulatory Visit: Payer: Self-pay | Admitting: Internal Medicine

## 2018-03-18 ENCOUNTER — Encounter: Payer: Self-pay | Admitting: Allergy and Immunology

## 2018-03-18 ENCOUNTER — Ambulatory Visit (INDEPENDENT_AMBULATORY_CARE_PROVIDER_SITE_OTHER): Payer: Medicare Other | Admitting: Allergy and Immunology

## 2018-03-18 ENCOUNTER — Encounter (INDEPENDENT_AMBULATORY_CARE_PROVIDER_SITE_OTHER): Payer: Self-pay

## 2018-03-18 VITALS — BP 122/86 | HR 62 | Resp 16 | Ht 63.0 in | Wt 173.0 lb

## 2018-03-18 DIAGNOSIS — K219 Gastro-esophageal reflux disease without esophagitis: Secondary | ICD-10-CM

## 2018-03-18 DIAGNOSIS — H04123 Dry eye syndrome of bilateral lacrimal glands: Secondary | ICD-10-CM | POA: Diagnosis not present

## 2018-03-18 DIAGNOSIS — J3089 Other allergic rhinitis: Secondary | ICD-10-CM

## 2018-03-18 MED ORDER — AZELASTINE HCL 0.1 % NA SOLN: 2.0000 | Freq: Two times a day (BID) | NASAL | 1 refills | Status: DC

## 2018-03-18 MED ORDER — MONTELUKAST SODIUM 10 MG PO TABS: 10.0000 mg | ORAL_TABLET | Freq: Every day | ORAL | 1 refills | Status: DC

## 2018-03-18 MED ORDER — ESOMEPRAZOLE MAGNESIUM 40 MG PO CPDR: 40.0000 mg | DELAYED_RELEASE_CAPSULE | Freq: Every day | ORAL | 1 refills | Status: DC

## 2018-03-18 NOTE — Patient Instructions (Addendum)
  1.  Allergen avoidance measures  2.  Treat and prevent inflammation:   A.  OTC Nasacort 1 spray each nostril 3-7 times a week  B.  Montelukast 10 mg tablet 1 time per day  3.  If needed:   A. Azelastine -2 sprays each nostril 2 times a day  B.  OTC Systane eyedrops multiple times a day  4.  Treat and prevent reflux:   A.  Consolidate caffeine consumption  B.  Nexium 40 mg tablet 1 time per day  5.  Return to clinic in 4 weeks or earlier if problem  6.  Obtain fall flu vaccine

## 2018-03-18 NOTE — Progress Notes (Signed)
Dear Jasmine Cantu,  Thank you for referring Jasmine Cantu to the [facility] on 03/18/2018.   Below is a summation of this patient's evaluation and recommendations.  Thank you for your referral. I will keep you informed about this patient's response to treatment.   If you have any questions please do not hesitate to contact me.   Sincerely,  Jasmine Prows, MD Allergy / Immunology [place]   ______________________________________________________________________    NEW PATIENT NOTE  Referring Provider: Dianna Rossetti, NP Primary Provider: Dianna Rossetti, NP Date of office visit: 03/18/2018    Subjective:   Chief Complaint:  Jasmine Cantu (DOB: 30-Jul-1940) is a 77 y.o. female who presents to the clinic on 03/18/2018 with a chief complaint of Allergic Rhinitis  .     HPI: [name] presents to this clinic in evaluation of persistent airway issues.  Apparently I had seen her in this clinic about 4-1/2 years ago.  She describes an issue with sneezing and clear rhinorrhea without any anosmia or ugly nasal discharge or issues with headache without any obvious precipitants.  Apparently she was skin test positive to dust mite in the past.  If she uses an antihistamine this results in some improvement.  However, she cannot really tolerate the use of antihistamines because of a rather significant issue with dry eye syndrome.  Apparently she had a CT scan of her sinuses which identified chronic sinusitis several years ago and it was recommended that she undergo a surgical procedure but she did not follow through that recommendation and most of her persistent face pain appears to have resolved.  She apparently has a long history of reflux as well for which she took Nexium on a regular basis but now she does not really have that much problems with reflux and uses Nexium monthly.  She does drink a Pepsi a day  and 4 coffees a day.  She does not have any significant throat problems with this issue.  Past Medical History:  Diagnosis Date  . Allergy   . Anemia   . Cataract   . Collagenous colitis 2004  . Diverticulosis   . Esophageal stricture 1988  . GERD (gastroesophageal reflux disease)   . H. pylori infection   . Hiatal hernia   . IBS (irritable bowel syndrome)   . Iron deficiency anemia   . Tubular adenoma of colon   . Ulcer   . Vitamin B12 deficiency     Past Surgical History:  Procedure Laterality Date  . APPENDECTOMY    . BLADDER SUSPENSION    . CHOLECYSTECTOMY    . EYE SURGERY    . VAGINAL HYSTERECTOMY      Allergies as of 03/18/2018      Reactions   Codeine Other (See Comments)   GI upset      Medication List      esomeprazole 40 MG capsule Commonly known as:  NEXIUM Take 40 mg by mouth daily before breakfast.   VITAMIN B-12 IJ Inject 1,000 mg as directed.       Review of systems negative except as noted in HPI / PMHx or noted below:  Review of Systems  Constitutional: Negative.   HENT: Negative.   Eyes: Negative.   Respiratory: Negative.   Cardiovascular: Negative.   Gastrointestinal: Negative.   Genitourinary: Negative.   Musculoskeletal: Negative.   Skin: Negative.   Neurological: Negative.   Endo/Heme/Allergies:  Negative.   Psychiatric/Behavioral: Negative.     Family History  Problem Relation Age of Onset  . Diabetes Mother   . Colon cancer Mother   . Stroke Father   . Cancer Sister   . Cancer Brother   . Heart attack Brother   . Cancer Brother   . Heart attack Brother     Social History   Socioeconomic History  . Marital status: Single    Spouse name: Not on file  . Number of children: Not on file  . Years of education: Not on file  . Highest education level: Not on file  Occupational History  . Not on file  Social Needs  . Financial resource strain: Not on file  . Food insecurity:    Worry: Not on file    Inability: Not  on file  . Transportation needs:    Medical: Not on file    Non-medical: Not on file  Tobacco Use  . Smoking status: Never Smoker  . Smokeless tobacco: Never Used  Substance and Sexual Activity  . Alcohol use: No  . Drug use: No  . Sexual activity: Yes  Lifestyle  . Physical activity:    Days per week: Not on file    Minutes per session: Not on file  . Stress: Not on file  Relationships  . Social connections:    Talks on phone: Not on file    Gets together: Not on file    Attends religious service: Not on file    Active member of club or organization: Not on file    Attends meetings of clubs or organizations: Not on file    Relationship status: Not on file  . Intimate partner violence:    Fear of current or ex partner: Not on file    Emotionally abused: Not on file    Physically abused: Not on file    Forced sexual activity: Not on file  Other Topics Concern  . Not on file  Social History Narrative  . Not on file    Environmental and Social history  Lives in a house with a dry environment, dogs located inside the household, carpet in the bedroom, no plastic on the bed, plastic on the pillow, and no smokers located inside the household.  Objective:   Vitals:   03/18/18 1442  BP: 122/86  Pulse: 62  Resp: 16  SpO2: 98%   Height: 5\' 3"  (160 cm) Weight: 173 lb (78.5 kg)  Physical Exam  HENT:  Head: Normocephalic. Head is without right periorbital erythema and without left periorbital erythema.  Right Ear: Tympanic membrane, external ear and ear canal normal.  Left Ear: Tympanic membrane, external ear and ear canal normal.  Nose: Nose normal. No mucosal edema or rhinorrhea.  Mouth/Throat: Oropharynx is clear and moist and mucous membranes are normal. No oropharyngeal exudate.  Eyes: Pupils are equal, round, and reactive to light. Conjunctivae and lids are normal.  Neck: Trachea normal. No tracheal deviation present. No thyromegaly present.  Cardiovascular: Normal  rate, regular rhythm, S1 normal, S2 normal and normal heart sounds.  No murmur heard. Pulmonary/Chest: Effort normal. No stridor. No respiratory distress. She has no wheezes. She has no rales. She exhibits no tenderness.  Abdominal: Soft. She exhibits no distension and no mass. There is no hepatosplenomegaly. There is no tenderness. There is no rebound and no guarding.  Musculoskeletal: She exhibits no edema or tenderness.  Lymphadenopathy:       Head (right side): No  tonsillar adenopathy present.       Head (left side): No tonsillar adenopathy present.    She has no cervical adenopathy.    She has no axillary adenopathy.  Neurological: She is alert.  Skin: No rash noted. She is not diaphoretic. No erythema. No pallor. Nails show no clubbing.    Diagnostics: Allergy skin tests were performed.  She did not demonstrate any hypersensitivity against a screening panel of aeroallergens or foods.  Results of a sinus CT scan obtained 28 February 2016 identified the following:  Mild mucosal edema in the base of the maxillary sinus bilaterally. Ostiomeatal complex patent bilaterally. Mild mucosal edema at the ostium of the maxillary sinus bilaterally. Mild mucosal edema in the ethmoid sinus bilaterally. Frontal and sphenoid sinus clear bilaterally. Limited imaging of the mastoid sinus and middle ear is negative bilaterally.Nasal septum mildly deviated.No acute bony abnormality.  Assessment and Plan:    1. Perennial allergic rhinitis   2. Dry eye syndrome of both eyes   3. Gastroesophageal reflux disease, esophagitis presence not specified     1.  Allergen avoidance measures  2.  Treat and prevent inflammation:   A.  OTC Nasacort 1 spray each nostril 3-7 times a week  B.  Montelukast 10 mg tablet 1 time per day  3.  If needed:   A. Azelastine -2 sprays each nostril 2 times a day  B.  OTC Systane eyedrops multiple times a day  4.  Treat and prevent reflux:   A.  Consolidate caffeine  consumption  B.  Nexium 40 mg tablet 1 time per day  5.  Return to clinic in 4 weeks or earlier if problem  6.  Obtain fall flu vaccine  Lakeena will use anti-inflammatory agents for her respiratory tract to see if we can minimize her respiratory tract symptoms.  She cannot really tolerate oral antihistamines because of her dry eye syndrome and I have given her nasal antihistamines to be used as needed I recommended that she use over-the-counter Systane a few times per day for her dry eye.  She also appears to have reflux and I made a recommendation that she treat this issue by minimizing her extensive caffeine consumption and also consider using her proton pump inhibitor more commonly than 1 time per month.  I will regroup with her in 4 weeks to assess her response to therapy and consider further evaluation and treatment based upon this response.  Jasmine Prows, MD Allergy / Immunology [facility]

## 2018-03-19 ENCOUNTER — Encounter: Payer: Self-pay | Admitting: Allergy and Immunology

## 2018-04-16 ENCOUNTER — Ambulatory Visit (INDEPENDENT_AMBULATORY_CARE_PROVIDER_SITE_OTHER): Payer: Medicare Other | Admitting: Allergy and Immunology

## 2018-04-16 ENCOUNTER — Encounter: Payer: Self-pay | Admitting: Allergy and Immunology

## 2018-04-16 VITALS — BP 118/70 | HR 60 | Resp 16

## 2018-04-16 DIAGNOSIS — K219 Gastro-esophageal reflux disease without esophagitis: Secondary | ICD-10-CM | POA: Diagnosis not present

## 2018-04-16 DIAGNOSIS — J3089 Other allergic rhinitis: Secondary | ICD-10-CM | POA: Diagnosis not present

## 2018-04-16 DIAGNOSIS — H04123 Dry eye syndrome of bilateral lacrimal glands: Secondary | ICD-10-CM | POA: Diagnosis not present

## 2018-04-16 NOTE — Patient Instructions (Addendum)
  1.  Continue to Treat and prevent inflammation:   A.  OTC Nasacort 1 spray each nostril 3-7 times a week  B.  Montelukast 10 mg tablet 1 time per day  2.  If needed:   A. Azelastine -2 sprays each nostril 2 times a day  B.  OTC Systane eyedrops multiple times a day  3.  Continue to Treat and prevent reflux:   A.  Consolidate caffeine consumption  B.  Nexium 40 mg tablet 1 time per day  4.  Return to clinic in 6 months or earlier if problem  5.  Obtain fall flu vaccine

## 2018-04-16 NOTE — Progress Notes (Signed)
Follow-up Note  Referring Provider: Dianna Rossetti, NP Primary Provider: Dianna Rossetti, NP Date of Office Visit: 04/16/2018  Subjective:   Jasmine Cantu (DOB: 01/08/1941) is a 77 y.o. female who returns to the [facility] on 04/16/2018 in re-evaluation of the following:  HPI: Jasmine Cantu returns to this clinic in reevaluation of her allergic and nonallergic rhinitis, dry eye syndrome, and reflux.  I last saw her in this clinic during her initial evaluation of 18 March 2018.  She is much better regarding her nose.  She has very little sneezing and very little runny nose and has no nasal congestion.  She is using her Nasacort about 3 times a week.  Her eyes are still somewhat dry and she still must use tears throughout the day.  She has not really been having much reflux and she is using her Nexium about twice a week.  She still continues to drink caffeine throughout the day.  Allergies as of 04/16/2018      Reactions   Codeine Other (See Comments)   GI upset   Hydrocodone Nausea Only      Medication List      azelastine 0.1 % nasal spray Commonly known as:  ASTELIN Place 2 sprays into both nostrils 2 (two) times daily.   esomeprazole 40 MG capsule Commonly known as:  NEXIUM Take 1 capsule (40 mg total) by mouth daily before breakfast.   montelukast 10 MG tablet Commonly known as:  SINGULAIR Take 1 tablet (10 mg total) by mouth at bedtime.   NASACORT ALLERGY 24HR 55 MCG/ACT Aero nasal inhaler Generic drug:  triamcinolone Place 2 sprays into the nose daily.   VITAMIN B-12 IJ Inject 1,000 mg as directed.       Past Medical History:  Diagnosis Date  . Allergy   . Anemia   . Cataract   . Collagenous colitis 2004  . Diverticulosis   . Esophageal stricture 1988  . GERD (gastroesophageal reflux disease)   . H. pylori infection   . Hiatal hernia   . IBS (irritable bowel syndrome)   . Iron deficiency anemia   . Tubular adenoma of colon   . Ulcer   .  Vitamin B12 deficiency     Past Surgical History:  Procedure Laterality Date  . APPENDECTOMY    . BLADDER SUSPENSION    . CHOLECYSTECTOMY    . EYE SURGERY    . VAGINAL HYSTERECTOMY      Review of systems negative except as noted in HPI / PMHx or noted below:  Review of Systems  Constitutional: Negative.   HENT: Negative.   Eyes: Negative.   Respiratory: Negative.   Cardiovascular: Negative.   Gastrointestinal: Negative.   Genitourinary: Negative.   Musculoskeletal: Negative.   Skin: Negative.   Neurological: Negative.   Endo/Heme/Allergies: Negative.   Psychiatric/Behavioral: Negative.      Objective:   Vitals:   04/16/18 1027  BP: 118/70  Pulse: 60  Resp: 16          Physical Exam  HENT:  Head: Normocephalic.  Right Ear: Tympanic membrane, external ear and ear canal normal.  Left Ear: Tympanic membrane, external ear and ear canal normal.  Nose: Nose normal. No mucosal edema or rhinorrhea.  Mouth/Throat: Uvula is midline, oropharynx is clear and moist and mucous membranes are normal. No oropharyngeal exudate.  Eyes: Conjunctivae are normal.  Neck: Trachea normal. No tracheal tenderness present. No tracheal deviation present. No thyromegaly present.  Cardiovascular: Normal  rate, regular rhythm, S1 normal, S2 normal and normal heart sounds.  No murmur heard. Pulmonary/Chest: Breath sounds normal. No stridor. No respiratory distress. She has no wheezes. She has no rales.  Musculoskeletal: She exhibits no edema.  Lymphadenopathy:       Head (right side): No tonsillar adenopathy present.       Head (left side): No tonsillar adenopathy present.    She has no cervical adenopathy.  Neurological: She is alert.  Skin: No rash noted. She is not diaphoretic. No erythema. Nails show no clubbing.    Diagnostics: none  Assessment and Plan:   1. Perennial allergic rhinitis   2. Dry eye syndrome of both eyes   3. Gastroesophageal reflux disease, esophagitis presence  not specified     1.  Continue to Treat and prevent inflammation:   A.  OTC Nasacort 1 spray each nostril 3-7 times a week  B.  Montelukast 10 mg tablet 1 time per day  2.  If needed:   A. Azelastine -2 sprays each nostril 2 times a day  B.  OTC Systane eyedrops multiple times a day  3.  Continue to Treat and prevent reflux:   A.  Consolidate caffeine consumption  B.  Nexium 40 mg tablet 1 time per day  4.  Return to clinic in 6 months or earlier if problem  5.  Obtain fall flu vaccine  Jasmine Cantu is really doing very well on her current plan.  I will have her continue to use Nasacort and montelukast on a pretty regular basis and I have encouraged her to consolidate her caffeine consumption which would help her reflux.  I will see her back in this clinic in 6 months or earlier if there is a problem.  Allena Katz, MD Allergy / Immunology [place]

## 2018-04-17 ENCOUNTER — Encounter: Payer: Self-pay | Admitting: Allergy and Immunology

## 2018-09-04 ENCOUNTER — Ambulatory Visit
Admission: EM | Admit: 2018-09-04 | Discharge: 2018-09-04 | Disposition: A | Discharge status: Home / Self Care | Payer: Medicare Other | Attending: Family Medicine | Admitting: Family Medicine

## 2018-09-04 ENCOUNTER — Encounter: Payer: Self-pay | Admitting: Emergency Medicine

## 2018-09-04 DIAGNOSIS — J019 Acute sinusitis, unspecified: Secondary | ICD-10-CM

## 2018-09-04 HISTORY — DX: Essential (primary) hypertension: I10

## 2018-09-04 MED ORDER — AMOXICILLIN-POT CLAVULANATE 875-125 MG PO TABS: 1.0000 | ORAL_TABLET | Freq: Two times a day (BID) | ORAL | 0 refills | Status: DC

## 2018-09-04 MED ORDER — BENZONATATE 200 MG PO CAPS: 200.0000 mg | ORAL_CAPSULE | Freq: Three times a day (TID) | ORAL | 0 refills | Status: AC | PRN

## 2018-09-04 MED ORDER — HYDROCOD POLST-CPM POLST ER 10-8 MG/5ML PO SUER: 5.0000 mL | Freq: Every evening | ORAL | 0 refills | Status: DC | PRN

## 2018-09-04 NOTE — ED Provider Notes (Signed)
[facility]    CSN: [id number] Arrival date & time: 09/04/18  1300     History   Chief Complaint Chief Complaint  Patient presents with  . URI    HPI Jasmine Cantu is a 78 y.o. female history of hypertension and GERD presenting today for evaluation of URI symptoms.  Patient states that she has had cough, congestion, headaches and ear discomfort.  Symptoms have been persistent over the past week.  Of recently she has felt her symptoms worsen.  She is also started to develop a sore throat.  She has had a mild cough, keeping her up at night.  Denies any fevers.  She has tried Tylenol and NyQuil.  Denies chest pain or shortness of breath.  Feels symptoms are mainly in her head.  HPI  Past Medical History:  Diagnosis Date  . Allergy   . Anemia   . Cataract   . Collagenous colitis 2004  . Diverticulosis   . Esophageal stricture 1988  . GERD (gastroesophageal reflux disease)   . H. pylori infection   . Hiatal hernia   . Hypertension   . IBS (irritable bowel syndrome)   . Iron deficiency anemia   . Tubular adenoma of colon   . Ulcer   . Vitamin B12 deficiency     Patient Active Problem List   Diagnosis Date Noted  . Reflux 05/31/2012    Past Surgical History:  Procedure Laterality Date  . APPENDECTOMY    . BLADDER SUSPENSION    . CHOLECYSTECTOMY    . EYE SURGERY    . VAGINAL HYSTERECTOMY      OB History   No obstetric history on file.      Home Medications    Prior to Admission medications   Medication Sig Start Date End Date Taking? Authorizing Provider  benazepril (LOTENSIN) 20 MG tablet Take 20 mg by mouth daily.   Yes [provider]  Cyanocobalamin (VITAMIN B-12 IJ) Inject 1,000 mg as directed.   Yes [provider]  esomeprazole (NEXIUM) 40 MG capsule Take 1 capsule (40 mg total) by mouth daily before breakfast. 03/18/18  Yes Kozlow, Donnamarie Poag, MD  montelukast (SINGULAIR) 10 MG tablet Take 1 tablet (10 mg total) by mouth at  bedtime. 03/18/18  Yes Kozlow, Donnamarie Poag, MD  triamcinolone (NASACORT ALLERGY 24HR) 55 MCG/ACT AERO nasal inhaler Place 2 sprays into the nose daily.   Yes [provider]  amoxicillin-clavulanate (AUGMENTIN) 875-125 MG tablet Take 1 tablet by mouth every 12 (twelve) hours. 09/04/18   Wieters, Hallie C, PA-C  azelastine (ASTELIN) 0.1 % nasal spray Place 2 sprays into both nostrils 2 (two) times daily. 03/18/18   Kozlow, Donnamarie Poag, MD  benzonatate (TESSALON) 200 MG capsule Take 1 capsule (200 mg total) by mouth 3 (three) times daily as needed for up to 7 days for cough. 09/04/18 09/11/18  Wieters, Hallie C, PA-C  chlorpheniramine-HYDROcodone (TUSSIONEX PENNKINETIC ER) 10-8 MG/5ML SUER Take 5 mLs by mouth at bedtime as needed for cough. 09/04/18   Wieters, Elesa Hacker, PA-C    Family History Family History  Problem Relation Age of Onset  . Diabetes Mother   . Colon cancer Mother   . Stroke Father   . Cancer Sister   . Cancer Brother   . Heart attack Brother   . Cancer Brother   . Heart attack Brother     Social History Social History   Tobacco Use  . Smoking status: Never Smoker  .  Smokeless tobacco: Never Used  Substance Use Topics  . Alcohol use: No  . Drug use: No     Allergies   Codeine and Hydrocodone   Review of Systems Review of Systems  Constitutional: Negative for activity change, appetite change, chills, fatigue and fever.  HENT: Positive for congestion, ear pain, rhinorrhea, sinus pressure and sore throat. Negative for trouble swallowing.   Eyes: Negative for discharge and redness.  Respiratory: Positive for cough. Negative for chest tightness and shortness of breath.   Cardiovascular: Negative for chest pain.  Gastrointestinal: Negative for abdominal pain, diarrhea, nausea and vomiting.  Musculoskeletal: Negative for myalgias.  Skin: Negative for rash.  Neurological: Negative for dizziness, light-headedness and headaches.     Physical Exam Triage Vital  Signs ED Triage Vitals [09/04/18 1310]  Enc Vitals Group     BP (!) 182/84     Pulse Rate (!) 58     Resp 16     Temp 97.6 F (36.4 C)     Temp Source Oral     SpO2 97 %     Weight      Height      Head Circumference      Peak Flow      Pain Score 5     Pain Loc      Pain Edu?      Excl. in Max?    No data found.  Updated Vital Signs BP (!) 182/84 (BP Location: Left Arm)   Pulse (!) 58   Temp 97.6 F (36.4 C) (Oral)   Resp 16   SpO2 97%   Visual Acuity Right Eye Distance:   Left Eye Distance:   Bilateral Distance:    Right Eye Near:   Left Eye Near:    Bilateral Near:     Physical Exam Vitals signs and nursing note reviewed.  Constitutional:      General: She is not in acute distress.    Appearance: She is well-developed.  HENT:     Head: Normocephalic and atraumatic.     Ears:     Comments: Bilateral ears without tenderness to palpation of external auricle, tragus and mastoid, EAC's without erythema or swelling, TM's with good bony landmarks and cone of light. Non erythematous.    Mouth/Throat:     Comments: Oral mucosa pink and moist, no tonsillar enlargement or exudate. Posterior pharynx patent and nonerythematous, no uvula deviation or swelling. Normal phonation. Eyes:     Conjunctiva/sclera: Conjunctivae normal.  Neck:     Musculoskeletal: Neck supple.  Cardiovascular:     Rate and Rhythm: Normal rate and regular rhythm.     Heart sounds: No murmur.  Pulmonary:     Effort: Pulmonary effort is normal. No respiratory distress.     Breath sounds: Normal breath sounds.     Comments: Breathing comfortably at rest, CTABL, no wheezing, rales or other adventitious sounds auscultated Abdominal:     Palpations: Abdomen is soft.     Tenderness: There is no abdominal tenderness.  Skin:    General: Skin is warm and dry.  Neurological:     Mental Status: She is alert.      UC Treatments / Results  Labs (all labs ordered are listed, but only abnormal  results are displayed) Labs Reviewed - No data to display  EKG None  Radiology No results found.  Procedures Procedures (including critical care time)  Medications Ordered in UC Medications - No data to display  Initial Impression /  Assessment and Plan / UC Course  I have reviewed the triage vital signs and the nursing notes.  Pertinent labs & imaging results that were available during my care of the patient were reviewed by me and considered in my medical decision making (see chart for details).     Patient with URI symptoms greater than 1 week.  Will provide Augmentin to treat for sinusitis.  Also recommended antihistamines/Flonase to help with congestion and ear discomfort.  Per patient request did provide patient with Tussionex to take at bedtime as this is helped with her symptoms previously and she has tolerated despite her reported allergy to hydrocodone causing nausea.  Will provide Tessalon for cough during the day.  Discussed drowsiness regarding Tussionex.  Patient verbalized understanding.Discussed strict return precautions. Patient verbalized understanding and is agreeable with plan.  Final Clinical Impressions(s) / UC Diagnoses   Final diagnoses:  Acute sinusitis with symptoms > 10 days     Discharge Instructions     Begin Augmentin twice daily May use zyrtec/flonase Tessalon for day time cough Tussionex at bedtime- will cause drowsiness  Follow up if symptoms not resolving or worsening   ED Prescriptions    Medication Sig Dispense Auth. Provider   amoxicillin-clavulanate (AUGMENTIN) 875-125 MG tablet Take 1 tablet by mouth every 12 (twelve) hours. 14 tablet Wieters, Hallie C, PA-C   chlorpheniramine-HYDROcodone (TUSSIONEX PENNKINETIC ER) 10-8 MG/5ML SUER Take 5 mLs by mouth at bedtime as needed for cough. 60 mL Wieters, Hallie C, PA-C   benzonatate (TESSALON) 200 MG capsule Take 1 capsule (200 mg total) by mouth 3 (three) times daily as needed for up to 7  days for cough. 28 capsule Wieters, Hallie C, PA-C     Controlled Substance Prescriptions [place] Controlled Substance Registry consulted? Yes, I have consulted the  Controlled Substances Registry for this patient, and feel the risk/benefit ratio today is favorable for proceeding with this prescription for a controlled substance.   Janith Lima, [place] 09/04/18 1334

## 2018-09-04 NOTE — ED Notes (Signed)
Patient able to ambulate independently  

## 2018-09-04 NOTE — ED Triage Notes (Signed)
PT presents to [facility] for assessment of cough, congestion, headache, ear pain x 1 week.

## 2018-09-04 NOTE — Discharge Instructions (Signed)
Begin Augmentin twice daily May use zyrtec/flonase Tessalon for day time cough Tussionex at bedtime- will cause drowsiness  Follow up if symptoms not resolving or worsening

## 2018-11-25 ENCOUNTER — Ambulatory Visit
Admission: EM | Admit: 2018-11-25 | Discharge: 2018-11-25 | Disposition: A | Discharge status: Home / Self Care | Payer: Medicare Other | Attending: Physician Assistant | Admitting: Physician Assistant

## 2018-11-25 ENCOUNTER — Other Ambulatory Visit: Payer: Self-pay

## 2018-11-25 DIAGNOSIS — B9689 Other specified bacterial agents as the cause of diseases classified elsewhere: Secondary | ICD-10-CM

## 2018-11-25 DIAGNOSIS — I10 Essential (primary) hypertension: Secondary | ICD-10-CM | POA: Diagnosis not present

## 2018-11-25 DIAGNOSIS — N3090 Cystitis, unspecified without hematuria: Secondary | ICD-10-CM

## 2018-11-25 LAB — POCT URINALYSIS DIP (MANUAL ENTRY)
Bilirubin, UA: NEGATIVE
Blood, UA: NEGATIVE
Glucose, UA: NEGATIVE mg/dL
Ketones, POC UA: NEGATIVE mg/dL
Nitrite, UA: NEGATIVE
Protein Ur, POC: NEGATIVE mg/dL
Spec Grav, UA: 1.02 (ref 1.010–1.025)
Urobilinogen, UA: 0.2 E.U./dL
pH, UA: 6 (ref 5.0–8.0)

## 2018-11-25 MED ORDER — CEPHALEXIN 500 MG PO CAPS: 500.0000 mg | ORAL_CAPSULE | Freq: Two times a day (BID) | ORAL | 0 refills | Status: AC

## 2018-11-25 NOTE — ED Triage Notes (Signed)
Pt c/o urinary difficulty and burning x2wks

## 2018-11-25 NOTE — Discharge Instructions (Signed)
Your urine was positive for an urinary tract infection. Start keflex as directed. Keep hydrated, urine should be clear to pale yellow in color. Monitor for any worsening of symptoms, fever, worsening abdominal pain, nausea/vomiting, flank pain, follow up for reevaluation.  ° °

## 2018-11-25 NOTE — ED Provider Notes (Signed)
[facility]    CSN: [id number] Arrival date & time: 11/25/18  1144     History   Chief Complaint Chief Complaint  Patient presents with  . Urinary Tract Infection    HPI Jasmine Cantu is a 78 y.o. female.   78 year old female comes in for 2 week history of urinary symptoms. She has had frequency, hesitancy, dysuria. Denies abdominal pain, nausea, vomiting. Denies fever, chills, night sweats. She has had intermittent flank pain with urination. Denies current pain. Has been taking AZO with some relief, last pill taken about 1 week ago. History of appendicitis, cholecystitis, bladder suspension x 2.      Past Medical History:  Diagnosis Date  . Allergy   . Anemia   . Cataract   . Collagenous colitis 2004  . Diverticulosis   . Esophageal stricture 1988  . GERD (gastroesophageal reflux disease)   . H. pylori infection   . Hiatal hernia   . Hypertension   . IBS (irritable bowel syndrome)   . Iron deficiency anemia   . Tubular adenoma of colon   . Ulcer   . Vitamin B12 deficiency     Patient Active Problem List   Diagnosis Date Noted  . Reflux 05/31/2012    Past Surgical History:  Procedure Laterality Date  . APPENDECTOMY    . BLADDER SUSPENSION    . CHOLECYSTECTOMY    . EYE SURGERY    . VAGINAL HYSTERECTOMY      OB History   No obstetric history on file.      Home Medications    Prior to Admission medications   Medication Sig Start Date End Date Taking? Authorizing Provider  benazepril (LOTENSIN) 20 MG tablet Take 20 mg by mouth daily.    [provider]  cephALEXin (KEFLEX) 500 MG capsule Take 1 capsule (500 mg total) by mouth 2 (two) times daily for 7 days. 11/25/18 12/02/18  Ok Edwards, PA-C    Family History Family History  Problem Relation Age of Onset  . Diabetes Mother   . Colon cancer Mother   . Stroke Father   . Cancer Sister   . Cancer Brother   . Heart attack Brother   . Cancer Brother   . Heart attack Brother      Social History Social History   Tobacco Use  . Smoking status: Never Smoker  . Smokeless tobacco: Never Used  Substance Use Topics  . Alcohol use: No  . Drug use: No     Allergies   Codeine and Hydrocodone   Review of Systems Review of Systems  Reason unable to perform ROS: See HPI as above.     Physical Exam Triage Vital Signs ED Triage Vitals [11/25/18 1154]  Enc Vitals Group     BP (!) 160/67     Pulse Rate 60     Resp 20     Temp 97.8 F (36.6 C)     Temp Source Oral     SpO2 98 %     Weight      Height      Head Circumference      Peak Flow      Pain Score 0     Pain Loc      Pain Edu?      Excl. in [place]?    No data found.  Updated Vital Signs BP (!) 160/67 (BP Location: Left Arm)   Pulse 60   Temp 97.8 F (36.6  C) (Oral)   Resp 20   SpO2 98%   Physical Exam Constitutional:      General: She is not in acute distress.    Appearance: She is well-developed. She is not ill-appearing, toxic-appearing or diaphoretic.  HENT:     Head: Normocephalic and atraumatic.  Eyes:     Conjunctiva/sclera: Conjunctivae normal.     Pupils: Pupils are equal, round, and reactive to light.  Cardiovascular:     Rate and Rhythm: Normal rate and regular rhythm.     Heart sounds: Normal heart sounds. No murmur. No friction rub. No gallop.   Pulmonary:     Effort: Pulmonary effort is normal.     Breath sounds: Normal breath sounds. No wheezing or rales.  Abdominal:     General: Bowel sounds are normal.     Palpations: Abdomen is soft.     Tenderness: There is no abdominal tenderness. There is no right CVA tenderness, left CVA tenderness, guarding or rebound.  Skin:    General: Skin is warm and dry.  Neurological:     Mental Status: She is alert and oriented to person, place, and time.  Psychiatric:        Behavior: Behavior normal.        Judgment: Judgment normal.    UC Treatments / Results  Labs (all labs ordered are listed, but only abnormal results are  displayed) Labs Reviewed  POCT URINALYSIS DIP (MANUAL ENTRY) - Abnormal; Notable for the following components:      Result Value   Leukocytes, UA Small (1+) (*)    All other components within normal limits  URINE CULTURE    EKG None  Radiology No results found.  Procedures Procedures (including critical care time)  Medications Ordered in UC Medications - No data to display  Initial Impression / Assessment and Plan / UC Course  I have reviewed the triage vital signs and the nursing notes.  Pertinent labs & imaging results that were available during my care of the patient were reviewed by me and considered in my medical decision making (see chart for details).    Urine dipstick with small leukocytes. Discussed possible skin cells causing symptoms, however, given significant symptoms for 2 weeks despite symptomatic regimen, will start keflex to cover for cystitis. Urine culture sent. Push fluids. Return precautions given.  Final Clinical Impressions(s) / UC Diagnoses   Final diagnoses:  Cystitis    ED Prescriptions    Medication Sig Dispense Auth. Provider   cephALEXin (KEFLEX) 500 MG capsule Take 1 capsule (500 mg total) by mouth 2 (two) times daily for 7 days. 14 capsule Tobin Chad, [place] 11/25/18 1214

## 2018-11-27 ENCOUNTER — Telehealth (HOSPITAL_COMMUNITY): Payer: Self-pay | Admitting: Emergency Medicine

## 2018-11-27 LAB — URINE CULTURE: Culture: >=100000 — AB

## 2018-11-27 NOTE — Telephone Encounter (Signed)
Urine culture was positive for KLEBSIELLA PNEUMONIAE and was given keflex  at urgent care visit. Patient contacted and made aware of all results, all questions answered.

## 2018-11-28 ENCOUNTER — Telehealth (HOSPITAL_COMMUNITY): Payer: Self-pay | Admitting: Emergency Medicine

## 2018-11-28 MED ORDER — CEPHALEXIN 500 MG PO CAPS: 500.0000 mg | ORAL_CAPSULE | Freq: Four times a day (QID) | ORAL | 0 refills | Status: DC

## 2018-11-28 MED ORDER — PHENAZOPYRIDINE HCL 200 MG PO TABS: 200.0000 mg | ORAL_TABLET | Freq: Three times a day (TID) | ORAL | 0 refills | Status: DC

## 2018-11-28 MED ORDER — CEPHALEXIN 500 MG PO CAPS: 500.0000 mg | ORAL_CAPSULE | Freq: Four times a day (QID) | ORAL | 0 refills | Status: AC

## 2018-11-28 NOTE — Telephone Encounter (Signed)
Patient called stating she was still having burning with urination, and frequent urination patient states shes been drinking a lot of water and using the bathroom a lot. She is also out of azo. , Lanelle Bal NP reviewed chart, will send in script for pyridium and increase the dose of keflex. Patient agreeable to plan.   Patient currently taking Keflex 500mg  BID. The provider is requesting increase in dose and duration to Keflex 500mg  QID starting 11/29/2018. Patient has 7 pills left from her scrip on 5/5, will supply her with another 21 pills to last another 7 days total.

## 2018-12-06 ENCOUNTER — Ambulatory Visit (HOSPITAL_COMMUNITY)
Admission: EM | Admit: 2018-12-06 | Discharge: 2018-12-06 | Disposition: A | Discharge status: Home / Self Care | Payer: Medicare Other | Attending: Family Medicine | Admitting: Family Medicine

## 2018-12-06 ENCOUNTER — Other Ambulatory Visit: Payer: Self-pay

## 2018-12-06 ENCOUNTER — Ambulatory Visit (INDEPENDENT_AMBULATORY_CARE_PROVIDER_SITE_OTHER): Payer: Medicare Other

## 2018-12-06 DIAGNOSIS — R079 Chest pain, unspecified: Secondary | ICD-10-CM

## 2018-12-06 DIAGNOSIS — R05 Cough: Secondary | ICD-10-CM

## 2018-12-06 DIAGNOSIS — R059 Cough, unspecified: Secondary | ICD-10-CM

## 2018-12-06 DIAGNOSIS — J301 Allergic rhinitis due to pollen: Secondary | ICD-10-CM | POA: Diagnosis not present

## 2018-12-06 MED ORDER — LEVOCETIRIZINE DIHYDROCHLORIDE 5 MG PO TABS: 5.0000 mg | ORAL_TABLET | Freq: Every evening | ORAL | 0 refills | Status: DC

## 2018-12-06 NOTE — ED Triage Notes (Signed)
Per pt she has been having a cough with congestion for 2 days then woke up this morning with chest tightness and discomfort. No sob, fevers or chills

## 2018-12-06 NOTE — Discharge Instructions (Addendum)
Claritin (loratadine), Allegra (fexofenadine), Zyrtec (cetirizine) which is also equivalent to Xyzal (levocetirizine); these are listed in order from weakest to strongest. Generic, and therefore cheaper, options are in the parentheses.   Flonase (fluticasone); nasal spray that is over the counter. 2 sprays each nostril, once daily. Aim towards the same side eye when you spray.  There are available OTC, and the generic versions, which may be cheaper, are in parentheses. Show this to a pharmacist if you have trouble finding any of these items.  If the reading physician sees anything on your X-ray that would change management, we will let you know.  Continue to push fluids, practice good hand hygiene, and cover your mouth if you cough.  If you start having fevers, shaking or shortness of breath, seek immediate care.  OK to take Tylenol 1000 mg (2 extra strength tabs) or 975 mg (3 regular strength tabs) every 6 hours as needed.

## 2018-12-06 NOTE — ED Provider Notes (Signed)
  [facility]    CSN: [id number] Arrival date & time: 12/06/18  1118     History    Chief Complaint  Patient presents with  . Cough  . Chest Pain    Brayah Matzke here for URI complaints.  Duration: 2 days  Associated symptoms: rhinorrhea, itchy watery eyes, ear fullness, chest tightness, chest pain and dry cough (from drainage) Denies: sinus headache, sinus congestion, sinus pain, ear pain, ear drainage, sore throat, wheezing, shortness of breath and fevers Treatment to date: Just finished Keflex for UTI No recent travel Sick contacts: No  ROS:  Const: Denies fevers HEENT: As noted in HPI Lungs: No SOB  Past Medical History:  Diagnosis Date  . Allergy   . Anemia   . Cataract   . Collagenous colitis 2004  . Diverticulosis   . Esophageal stricture 1988  . GERD (gastroesophageal reflux disease)   . H. pylori infection   . Hiatal hernia   . Hypertension   . IBS (irritable bowel syndrome)   . Iron deficiency anemia   . Tubular adenoma of colon   . Ulcer   . Vitamin B12 deficiency     BP (!) 158/64 (BP Location: Right Arm)   Pulse 68   Temp 98.3 F (36.8 C) (Oral)   Resp 18   SpO2 98%  General: Awake, alert, appears stated age HEENT: AT, [place], ears patent b/l and TM's neg, nares patent w/o discharge, pharynx pink and without exudates, MMM Neck: No masses or asymmetry Heart: RRR, no calf pain, no LE edema MSK: Chest pain is reproducible to palpations over mid clavicular line on R Lungs: CTAB, no accessory muscle use Psych: Age appropriate judgment and insight, normal mood and affect   Final diagnoses:  Cough  Seasonal allergic rhinitis due to pollen   Not likely infectious. Will let her know if CXR has anything abn read that would affect management. No news is good news. PO antihistamine.    Discharge Instructions     Claritin (loratadine), Allegra (fexofenadine), Zyrtec (cetirizine) which is also equivalent to Xyzal (levocetirizine); these are  listed in order from weakest to strongest. Generic, and therefore cheaper, options are in the parentheses.   Flonase (fluticasone); nasal spray that is over the counter. 2 sprays each nostril, once daily. Aim towards the same side eye when you spray.  There are available OTC, and the generic versions, which may be cheaper, are in parentheses. Show this to a pharmacist if you have trouble finding any of these items.  If the reading physician sees anything on your X-ray that would change management, we will let you know.  Continue to push fluids, practice good hand hygiene, and cover your mouth if you cough.  If you start having fevers, shaking or shortness of breath, seek immediate care.  OK to take Tylenol 1000 mg (2 extra strength tabs) or 975 mg (3 regular strength tabs) every 6 hours as needed.    ED Prescriptions    Medication Sig Dispense Auth. Provider   levocetirizine (XYZAL) 5 MG tablet Take 1 tablet (5 mg total) by mouth every evening. 30 tablet Shelda Pal, DO     Controlled Substance Prescriptions [place] Controlled Substance Registry consulted? Not Applicable   Shelda Pal, DO 12/06/18 1254

## 2018-12-31 ENCOUNTER — Other Ambulatory Visit: Payer: Self-pay | Admitting: Family Medicine

## 2019-05-28 ENCOUNTER — Telehealth: Payer: Self-pay

## 2019-05-28 ENCOUNTER — Ambulatory Visit
Admission: EM | Admit: 2019-05-28 | Discharge: 2019-05-28 | Disposition: A | Discharge status: Home / Self Care | Payer: Medicare Other | Attending: Physician Assistant | Admitting: Physician Assistant

## 2019-05-28 DIAGNOSIS — R059 Cough, unspecified: Secondary | ICD-10-CM

## 2019-05-28 DIAGNOSIS — R05 Cough: Secondary | ICD-10-CM | POA: Diagnosis not present

## 2019-05-28 DIAGNOSIS — R0981 Nasal congestion: Secondary | ICD-10-CM

## 2019-05-28 DIAGNOSIS — I10 Essential (primary) hypertension: Secondary | ICD-10-CM

## 2019-05-28 MED ORDER — AZELASTINE HCL 0.1 % NA SOLN: 2.0000 | Freq: Two times a day (BID) | NASAL | 0 refills | Status: DC

## 2019-05-28 MED ORDER — BENZONATATE 100 MG PO CAPS: 100.0000 mg | ORAL_CAPSULE | Freq: Three times a day (TID) | ORAL | 0 refills | Status: DC

## 2019-05-28 MED ORDER — PREDNISONE 50 MG PO TABS: 50.0000 mg | ORAL_TABLET | Freq: Every day | ORAL | 0 refills | Status: DC

## 2019-05-28 MED ORDER — OLOPATADINE HCL 0.2 % OP SOLN: 1.0000 [drp] | Freq: Every day | OPHTHALMIC | 0 refills | Status: DC

## 2019-05-28 NOTE — Discharge Instructions (Addendum)
Start azelastine and pataday as directed. Continue allergy medicines over the counter. Prednisone for cough if needed. As discussed, cannot rule out COVID. Please stay quarantined for the next 2 days on current medicine, if symptoms not improving, will need COVID testing. Follow up with PCP for further evaluation if symptoms not improving. Follow up at the emergency department if develop shortness of breath.

## 2019-05-28 NOTE — ED Triage Notes (Signed)
Pt c/o allergies flare up. C/o runny nose, headache, and watery eyes.

## 2019-05-28 NOTE — ED Provider Notes (Signed)
[facility]    CSN: [id number] Arrival date & time: 05/28/19  [id number]      History   Chief Complaint Chief Complaint  Patient presents with  . Nasal Congestion    HPI Jasmine Cantu is a 78 y.o. female.   78 year old female comes in for 1 week worsening of URI symptoms. Rhinorrhea, watery eyes, left ear pressure, headache. Intermittent cough, post nasal drip. Denies fever, chills, body aches. Denies abdominal pain, nausea, vomiting. Diarrhea that is normal for her due to IBS. Denies shortness of breath, loss of taste/smell. No sick contact.  Patient states played outside with grandkids for Halloween, and thinks exposure caused allergies to be worse.     Past Medical History:  Diagnosis Date  . Allergy   . Anemia   . Cataract   . Collagenous colitis 2004  . Diverticulosis   . Esophageal stricture 1988  . GERD (gastroesophageal reflux disease)   . H. pylori infection   . Hiatal hernia   . Hypertension   . IBS (irritable bowel syndrome)   . Iron deficiency anemia   . Tubular adenoma of colon   . Ulcer   . Vitamin B12 deficiency     Patient Active Problem List   Diagnosis Date Noted  . Reflux 05/31/2012    Past Surgical History:  Procedure Laterality Date  . APPENDECTOMY    . BLADDER SUSPENSION    . CHOLECYSTECTOMY    . EYE SURGERY    . VAGINAL HYSTERECTOMY      OB History   No obstetric history on file.      Home Medications    Prior to Admission medications   Medication Sig Start Date End Date Taking? Authorizing Provider  azelastine (ASTELIN) 0.1 % nasal spray Place 2 sprays into both nostrils 2 (two) times daily. Use in each nostril as directed 05/28/19   Tasia Catchings, Sagan Maselli V, PA-C  benazepril (LOTENSIN) 20 MG tablet Take 20 mg by mouth daily.    [provider]  benzonatate (TESSALON) 100 MG capsule Take 1 capsule (100 mg total) by mouth every 8 (eight) hours. 05/28/19   Tasia Catchings, Alithea Lapage V, PA-C  Olopatadine HCl 0.2 % SOLN Apply 1 drop to eye daily.  05/28/19   Tasia Catchings, Davonne Jarnigan V, PA-C  predniSONE (DELTASONE) 50 MG tablet Take 1 tablet (50 mg total) by mouth daily with breakfast. 05/28/19   Ok Edwards, PA-C    Family History Family History  Problem Relation Age of Onset  . Diabetes Mother   . Colon cancer Mother   . Stroke Father   . Cancer Sister   . Cancer Brother   . Heart attack Brother   . Cancer Brother   . Heart attack Brother     Social History Social History   Tobacco Use  . Smoking status: Never Smoker  . Smokeless tobacco: Never Used  Substance Use Topics  . Alcohol use: No  . Drug use: No     Allergies   Codeine and Hydrocodone   Review of Systems Review of Systems  Reason unable to perform ROS: See HPI as above.     Physical Exam Triage Vital Signs ED Triage Vitals  Enc Vitals Group     BP 05/28/19 0923 (!) 173/83     Pulse Rate 05/28/19 0923 69     Resp 05/28/19 0923 20     Temp 05/28/19 0923 98.3 F (36.8 C)     Temp src --  SpO2 05/28/19 0923 96 %     Weight --      Height --      Head Circumference --      Peak Flow --      Pain Score 05/28/19 0924 0     Pain Loc --      Pain Edu? --      Excl. in [place]? --    No data found.  Updated Vital Signs BP (!) 173/83 (BP Location: Left Arm)   Pulse 69   Temp 98.3 F (36.8 C)   Resp 20   SpO2 96%   Physical Exam Constitutional:      General: She is not in acute distress.    Appearance: Normal appearance. She is not ill-appearing, toxic-appearing or diaphoretic.  HENT:     Head: Normocephalic and atraumatic.     Mouth/Throat:     Mouth: Mucous membranes are moist.     Pharynx: Oropharynx is clear. Uvula midline.  Eyes:     Extraocular Movements: Extraocular movements intact.     Conjunctiva/sclera: Conjunctivae normal.     Pupils: Pupils are equal, round, and reactive to light.  Neck:     Musculoskeletal: Normal range of motion and neck supple.  Cardiovascular:     Rate and Rhythm: Normal rate and regular rhythm.     Heart sounds:  Normal heart sounds. No murmur. No friction rub. No gallop.   Pulmonary:     Effort: Pulmonary effort is normal. No accessory muscle usage, prolonged expiration, respiratory distress or retractions.     Comments: Lungs clear to auscultation without adventitious lung sounds. Neurological:     General: No focal deficit present.     Mental Status: She is alert and oriented to person, place, and time.      UC Treatments / Results  Labs (all labs ordered are listed, but only abnormal results are displayed) Labs Reviewed - No data to display  EKG   Radiology No results found.  Procedures Procedures (including critical care time)  Medications Ordered in UC Medications - No data to display  Initial Impression / Assessment and Plan / UC Course  I have reviewed the triage vital signs and the nursing notes.  Pertinent labs & imaging results that were available during my care of the patient were reviewed by me and considered in my medical decision making (see chart for details).    Patient declined Covid testing, stating she thinks it is allergy flareup.  Discussed trying allergy medications, and to test if symptoms not improving in 2 to 3 days.  Patient agreeable to this.  Pataday, azelastine nasal spray prescribed.  Patient to start over-the-counter allergy medicine, Flonase as well.  Return precautions given.  Patient requesting Tussionex for cough.  Discussed this is not usually prescribed for allergy flareup, especially given controlled substance.  Also discussed, if having significant cough, would really like Covid testing.  Patient requesting prednisone instead, will provide short course 50 mg x 3 days.  Stressed importance to quarantine until symptoms improve.  Stressed importance for Covid testing if continues with URI symptoms.   Final Clinical Impressions(s) / UC Diagnoses   Final diagnoses:  Nasal congestion  Cough   ED Prescriptions    Medication Sig Dispense Auth.  Provider   azelastine (ASTELIN) 0.1 % nasal spray Place 2 sprays into both nostrils 2 (two) times daily. Use in each nostril as directed 30 mL Jun Osment V, PA-C   Olopatadine HCl 0.2 % SOLN  Apply 1 drop to eye daily. 2.5 mL Dillyn Menna V, PA-C   benzonatate (TESSALON) 100 MG capsule Take 1 capsule (100 mg total) by mouth every 8 (eight) hours. 21 capsule Chadric Kimberley V, PA-C   predniSONE (DELTASONE) 50 MG tablet Take 1 tablet (50 mg total) by mouth daily with breakfast. 3 tablet Ok Edwards, PA-C     I have reviewed the PDMP during this encounter.   Ok Edwards, PA-C 05/28/19 1126

## 2019-05-29 LAB — NOVEL CORONAVIRUS, NAA: SARS-CoV-2, NAA: NOT DETECTED

## 2019-05-29 LAB — SPECIMEN STATUS REPORT

## 2019-08-05 ENCOUNTER — Ambulatory Visit
Admission: EM | Admit: 2019-08-05 | Discharge: 2019-08-05 | Disposition: A | Discharge status: Home / Self Care | Payer: Medicare Other

## 2019-08-05 ENCOUNTER — Ambulatory Visit
Admission: EM | Admit: 2019-08-05 | Discharge: 2019-08-05 | Disposition: A | Discharge status: Home / Self Care | Payer: Medicare Other | Attending: Physician Assistant | Admitting: Physician Assistant

## 2019-08-05 ENCOUNTER — Other Ambulatory Visit: Payer: Self-pay

## 2019-08-05 DIAGNOSIS — R059 Cough, unspecified: Secondary | ICD-10-CM

## 2019-08-05 DIAGNOSIS — Z20822 Contact with and (suspected) exposure to covid-19: Secondary | ICD-10-CM | POA: Diagnosis not present

## 2019-08-05 DIAGNOSIS — R05 Cough: Secondary | ICD-10-CM | POA: Diagnosis not present

## 2019-08-05 DIAGNOSIS — J0140 Acute pansinusitis, unspecified: Secondary | ICD-10-CM

## 2019-08-05 MED ORDER — BENZONATATE 200 MG PO CAPS: 200.0000 mg | ORAL_CAPSULE | Freq: Three times a day (TID) | ORAL | 0 refills | Status: DC

## 2019-08-05 MED ORDER — IPRATROPIUM BROMIDE 0.06 % NA SOLN: 2.0000 | Freq: Four times a day (QID) | NASAL | 0 refills | Status: DC

## 2019-08-05 MED ORDER — DOXYCYCLINE HYCLATE 100 MG PO CAPS: 100.0000 mg | ORAL_CAPSULE | Freq: Two times a day (BID) | ORAL | 0 refills | Status: DC

## 2019-08-05 MED ORDER — ALBUTEROL SULFATE HFA 108 (90 BASE) MCG/ACT IN AERS: 1.0000 | INHALATION_SPRAY | Freq: Four times a day (QID) | RESPIRATORY_TRACT | 0 refills | Status: DC | PRN

## 2019-08-05 MED ORDER — PREDNISONE 50 MG PO TABS: 50.0000 mg | ORAL_TABLET | Freq: Every day | ORAL | 0 refills | Status: DC

## 2019-08-05 NOTE — ED Triage Notes (Signed)
Pt c/o cough x3wks, now having a running nose and chest tightness. States took a hydrocodone last night and it helped the cough.

## 2019-08-05 NOTE — Discharge Instructions (Signed)
COVID PCR testing ordered. I would like you to quarantine until testing results. Prednisone as directed. Albuterol as needed. Atrovent for drainage. If symptoms not improving, can add doxycycline.  Tylenol/motrin for pain and fever. Keep hydrated, urine should be clear to pale yellow in color. If experiencing shortness of breath, trouble breathing, go to the emergency department for further evaluation needed.

## 2019-08-05 NOTE — ED Provider Notes (Signed)
[facility]    CSN: [id number] Arrival date & time: 08/05/19  0806      History   Chief Complaint Chief Complaint  Patient presents with  . Cough    HPI Jasmine Cantu is a 79 y.o. female.   79 year old female comes in for 3 week history of cough. Rhinorrhea without sore throat. Has post nasal drip.  Denies fever, chills, body aches. Denies abdominal pain, nausea, vomiting, diarrhea. Denies shortness of breath, loss of taste/smell. Never smoker. Denies any sick/COVID contact. Finished Cephalexin 500mg  Q6H x 10 days last week for cellulitis.      Past Medical History:  Diagnosis Date  . Allergy   . Anemia   . Cataract   . Collagenous colitis 2004  . Diverticulosis   . Esophageal stricture 1988  . GERD (gastroesophageal reflux disease)   . H. pylori infection   . Hiatal hernia   . Hypertension   . IBS (irritable bowel syndrome)   . Iron deficiency anemia   . Tubular adenoma of colon   . Ulcer   . Vitamin B12 deficiency     Patient Active Problem List   Diagnosis Date Noted  . Reflux 05/31/2012    Past Surgical History:  Procedure Laterality Date  . APPENDECTOMY    . BLADDER SUSPENSION    . CHOLECYSTECTOMY    . EYE SURGERY    . VAGINAL HYSTERECTOMY      OB History   No obstetric history on file.      Home Medications    Prior to Admission medications   Medication Sig Start Date End Date Taking? Authorizing Provider  albuterol (VENTOLIN HFA) 108 (90 Base) MCG/ACT inhaler Inhale 1-2 puffs into the lungs every 6 (six) hours as needed for wheezing or shortness of breath. 08/05/19   Tasia Catchings, Amy V, PA-C  benazepril (LOTENSIN) 20 MG tablet Take 20 mg by mouth daily.    [provider]  benzonatate (TESSALON) 200 MG capsule Take 1 capsule (200 mg total) by mouth every 8 (eight) hours. 08/05/19   Tasia Catchings, Amy V, PA-C  doxycycline (VIBRAMYCIN) 100 MG capsule Take 1 capsule (100 mg total) by mouth 2 (two) times daily. 08/05/19   Tasia Catchings, Amy V, PA-C   ipratropium (ATROVENT) 0.06 % nasal spray Place 2 sprays into both nostrils 4 (four) times daily. 08/05/19   Tasia Catchings, Amy V, PA-C  Olopatadine HCl 0.2 % SOLN Apply 1 drop to eye daily. 05/28/19   Tasia Catchings, Amy V, PA-C  predniSONE (DELTASONE) 50 MG tablet Take 1 tablet (50 mg total) by mouth daily with breakfast. 08/05/19   Ok Edwards, PA-C    Family History Family History  Problem Relation Age of Onset  . Diabetes Mother   . Colon cancer Mother   . Stroke Father   . Cancer Sister   . Cancer Brother   . Heart attack Brother   . Cancer Brother   . Heart attack Brother     Social History Social History   Tobacco Use  . Smoking status: Never Smoker  . Smokeless tobacco: Never Used  Substance Use Topics  . Alcohol use: No  . Drug use: No     Allergies   Codeine and Hydrocodone   Review of Systems Review of Systems  Reason unable to perform ROS: See HPI as above.     Physical Exam Triage Vital Signs ED Triage Vitals [08/05/19 0815]  Enc Vitals Group     BP (!) 160/83  Pulse Rate 90     Resp 18     Temp 98.1 F (36.7 C)     Temp Source Oral     SpO2 97 %     Weight      Height      Head Circumference      Peak Flow      Pain Score 0     Pain Loc      Pain Edu?      Excl. in [place]?    No data found.  Updated Vital Signs BP (!) 160/83 (BP Location: Left Arm)   Pulse 90   Temp 98.1 F (36.7 C) (Oral)   Resp 18   SpO2 97%   Physical Exam Constitutional:      General: She is not in acute distress.    Appearance: Normal appearance. She is not ill-appearing, toxic-appearing or diaphoretic.  HENT:     Head: Normocephalic and atraumatic.     Mouth/Throat:     Mouth: Mucous membranes are moist.     Pharynx: Oropharynx is clear. Uvula midline.  Cardiovascular:     Rate and Rhythm: Normal rate and regular rhythm.     Heart sounds: Normal heart sounds. No murmur. No friction rub. No gallop.   Pulmonary:     Effort: Pulmonary effort is normal. No accessory muscle  usage, prolonged expiration, respiratory distress or retractions.     Comments: Lungs clear to auscultation without adventitious lung sounds. Musculoskeletal:     Cervical back: Normal range of motion and neck supple.  Skin:    General: Skin is warm and dry.  Neurological:     General: No focal deficit present.     Mental Status: She is alert and oriented to person, place, and time.    UC Treatments / Results  Labs (all labs ordered are listed, but only abnormal results are displayed) Labs Reviewed  NOVEL CORONAVIRUS, NAA    EKG   Radiology No results found.  Procedures Procedures (including critical care time)  Medications Ordered in UC Medications - No data to display  Initial Impression / Assessment and Plan / UC Course  I have reviewed the triage vital signs and the nursing notes.  Pertinent labs & imaging results that were available during my care of the patient were reviewed by me and considered in my medical decision making (see chart for details).    COVID PCR test ordered. Patient to quarantine until testing results return. No alarming signs on exam.  Patient speaking in full sentences without respiratory distress. Lungs clear to auscultation bilaterally without adventitious lung sounds. Will provide prednisone for symptomatic management. If symptoms still not improving, can start doxycycline to cover for bacterial sinusitis. Patient requesting tussionex cough syrup. Discussed with patient given controlled substance, it is not prescribed often. PDMP review showed that patient was able to get medication from PCP after visit with [place]. Can call PCP if needed. Otherwise, will try prednisone, albuterol, tessalon.  Push fluids.  Return precautions given.  Patient expresses understanding and agrees to plan.  Final Clinical Impressions(s) / UC Diagnoses   Final diagnoses:  Cough  Acute non-recurrent pansinusitis   ED Prescriptions    Medication Sig Dispense Auth. Provider    predniSONE (DELTASONE) 50 MG tablet Take 1 tablet (50 mg total) by mouth daily with breakfast. 5 tablet Yu, Amy V, PA-C   albuterol (VENTOLIN HFA) 108 (90 Base) MCG/ACT inhaler Inhale 1-2 puffs into the lungs every 6 (six) hours as  needed for wheezing or shortness of breath. 8 g Yu, Amy V, PA-C   doxycycline (VIBRAMYCIN) 100 MG capsule Take 1 capsule (100 mg total) by mouth 2 (two) times daily. 20 capsule Yu, Amy V, PA-C   ipratropium (ATROVENT) 0.06 % nasal spray Place 2 sprays into both nostrils 4 (four) times daily. 15 mL Yu, Amy V, PA-C   benzonatate (TESSALON) 200 MG capsule Take 1 capsule (200 mg total) by mouth every 8 (eight) hours. 21 capsule Ok Edwards, PA-C     PDMP not reviewed this encounter.   Ok Edwards, PA-C 08/05/19 [phone]

## 2019-08-07 LAB — NOVEL CORONAVIRUS, NAA: SARS-CoV-2, NAA: NOT DETECTED

## 2019-11-19 ENCOUNTER — Ambulatory Visit
Admission: EM | Admit: 2019-11-19 | Discharge: 2019-11-19 | Disposition: A | Discharge status: Home / Self Care | Payer: Medicare Other | Attending: Physician Assistant | Admitting: Physician Assistant

## 2019-11-19 DIAGNOSIS — R05 Cough: Secondary | ICD-10-CM

## 2019-11-19 DIAGNOSIS — J3489 Other specified disorders of nose and nasal sinuses: Secondary | ICD-10-CM

## 2019-11-19 DIAGNOSIS — R0981 Nasal congestion: Secondary | ICD-10-CM | POA: Diagnosis not present

## 2019-11-19 DIAGNOSIS — R059 Cough, unspecified: Secondary | ICD-10-CM

## 2019-11-19 MED ORDER — OLOPATADINE HCL 0.2 % OP SOLN: 1.0000 [drp] | Freq: Every day | OPHTHALMIC | 0 refills | Status: DC

## 2019-11-19 MED ORDER — AZELASTINE HCL 0.1 % NA SOLN: 2.0000 | Freq: Two times a day (BID) | NASAL | 0 refills | Status: DC

## 2019-11-19 MED ORDER — MONTELUKAST SODIUM 10 MG PO TABS: 10.0000 mg | ORAL_TABLET | Freq: Every day | ORAL | 0 refills | Status: DC

## 2019-11-19 NOTE — Discharge Instructions (Signed)
Continue claritin and nasacort. Can start azelastine, montelukast (singulair), pataday eyedrop. Follow up with PCP for refills if medicines help. Follow up with allergist if without significant relief of symptoms.

## 2019-11-19 NOTE — ED Provider Notes (Signed)
[facility]    CSN: [id number] Arrival date & time: 11/19/19  0802      History   Chief Complaint Chief Complaint  Patient presents with  . Allergies    HPI Jasmine Cantu is a 79 y.o. female.   79 year old female comes in for intermittent cough, rhinorrhea, dry/itching eyes for the past year. History of allergic rhinitis/allergic conjunctivitis, and has been followed by allergist in the past. Denies fever, chills. Denies shortness of breath, loss of taste/smell. COVID vaccine finished 09/2019. Currently on claritin, nasacort without relief.      Past Medical History:  Diagnosis Date  . Allergy   . Anemia   . Cataract   . Collagenous colitis 2004  . Diverticulosis   . Esophageal stricture 1988  . GERD (gastroesophageal reflux disease)   . H. pylori infection   . Hiatal hernia   . Hypertension   . IBS (irritable bowel syndrome)   . Iron deficiency anemia   . Tubular adenoma of colon   . Ulcer   . Vitamin B12 deficiency     Patient Active Problem List   Diagnosis Date Noted  . Reflux 05/31/2012    Past Surgical History:  Procedure Laterality Date  . APPENDECTOMY    . BLADDER SUSPENSION    . CHOLECYSTECTOMY    . EYE SURGERY    . VAGINAL HYSTERECTOMY      OB History   No obstetric history on file.      Home Medications    Prior to Admission medications   Medication Sig Start Date End Date Taking? Authorizing Provider  albuterol (VENTOLIN HFA) 108 (90 Base) MCG/ACT inhaler Inhale 1-2 puffs into the lungs every 6 (six) hours as needed for wheezing or shortness of breath. 08/05/19   Tasia Catchings, Amy V, PA-C  azelastine (ASTELIN) 0.1 % nasal spray Place 2 sprays into both nostrils 2 (two) times daily. 11/19/19   Tasia Catchings, Amy V, PA-C  benazepril (LOTENSIN) 20 MG tablet Take 20 mg by mouth daily.    [provider]  montelukast (SINGULAIR) 10 MG tablet Take 1 tablet (10 mg total) by mouth at bedtime. 11/19/19   Tasia Catchings, Amy V, PA-C  Olopatadine HCl 0.2 % SOLN  Apply 1 drop to eye daily. 11/19/19   Tasia Catchings, Amy V, PA-C  ipratropium (ATROVENT) 0.06 % nasal spray Place 2 sprays into both nostrils 4 (four) times daily. 08/05/19 11/19/19  Ok Edwards, PA-C    Family History Family History  Problem Relation Age of Onset  . Diabetes Mother   . Colon cancer Mother   . Stroke Father   . Cancer Sister   . Cancer Brother   . Heart attack Brother   . Cancer Brother   . Heart attack Brother     Social History Social History   Tobacco Use  . Smoking status: Never Smoker  . Smokeless tobacco: Never Used  Substance Use Topics  . Alcohol use: No  . Drug use: No     Allergies   Codeine and Hydrocodone   Review of Systems Review of Systems  Reason unable to perform ROS: See HPI as above.     Physical Exam Triage Vital Signs ED Triage Vitals [11/19/19 0814]  Enc Vitals Group     BP 139/74     Pulse Rate (!) 58     Resp 18     Temp 98 F (36.7 C)     Temp Source Oral     SpO2  97 %     Weight      Height      Head Circumference      Peak Flow      Pain Score 4     Pain Loc      Pain Edu?      Excl. in [place]?    No data found.  Updated Vital Signs BP 139/74 (BP Location: Left Arm)   Pulse (!) 58   Temp 98 F (36.7 C) (Oral)   Resp 18   SpO2 97%   Physical Exam Constitutional:      General: She is not in acute distress.    Appearance: Normal appearance. She is not ill-appearing, toxic-appearing or diaphoretic.  HENT:     Head: Normocephalic and atraumatic.     Mouth/Throat:     Mouth: Mucous membranes are moist.     Pharynx: Oropharynx is clear. Uvula midline.  Eyes:     Extraocular Movements: Extraocular movements intact.     Conjunctiva/sclera: Conjunctivae normal.     Pupils: Pupils are equal, round, and reactive to light.  Cardiovascular:     Rate and Rhythm: Normal rate and regular rhythm.     Heart sounds: Normal heart sounds. No murmur. No friction rub. No gallop.   Pulmonary:     Effort: Pulmonary effort is normal.  No accessory muscle usage, prolonged expiration, respiratory distress or retractions.     Comments: Lungs clear to auscultation without adventitious lung sounds. Musculoskeletal:     Cervical back: Normal range of motion and neck supple.  Neurological:     General: No focal deficit present.     Mental Status: She is alert and oriented to person, place, and time.      UC Treatments / Results  Labs (all labs ordered are listed, but only abnormal results are displayed) Labs Reviewed - No data to display  EKG   Radiology No results found.  Procedures Procedures (including critical care time)  Medications Ordered in UC Medications - No data to display  Initial Impression / Assessment and Plan / UC Course  I have reviewed the triage vital signs and the nursing notes.  Pertinent labs & imaging results that were available during my care of the patient were reviewed by me and considered in my medical decision making (see chart for details).    Continue otc antihistamine, nasacort. Will provide azelastine nasal spray, singulair, pataday for symptomatic management. Patient to follow up with PCP for refills if needed. Return precautions given.  Final Clinical Impressions(s) / UC Diagnoses   Final diagnoses:  Nasal congestion  Cough  Rhinorrhea   ED Prescriptions    Medication Sig Dispense Auth. Provider   azelastine (ASTELIN) 0.1 % nasal spray Place 2 sprays into both nostrils 2 (two) times daily. 30 mL Yu, Amy V, PA-C   montelukast (SINGULAIR) 10 MG tablet Take 1 tablet (10 mg total) by mouth at bedtime. 30 tablet Yu, Amy V, PA-C   Olopatadine HCl 0.2 % SOLN Apply 1 drop to eye daily. 2.5 mL Ok Edwards, PA-C     PDMP not reviewed this encounter.   Ok Edwards, PA-C 11/19/19 [phone]

## 2019-11-19 NOTE — ED Triage Notes (Signed)
Pt c/o allergies for off and on all year. States taking OTC meds with no relief. Pt c/o cough, runny nose and dry eyes.

## 2020-07-13 ENCOUNTER — Ambulatory Visit
Admission: EM | Admit: 2020-07-13 | Discharge: 2020-07-13 | Disposition: A | Discharge status: Home / Self Care | Payer: Medicare Other | Attending: Emergency Medicine | Admitting: Emergency Medicine

## 2020-07-13 ENCOUNTER — Other Ambulatory Visit: Payer: Self-pay

## 2020-07-13 DIAGNOSIS — Z20822 Contact with and (suspected) exposure to covid-19: Secondary | ICD-10-CM | POA: Diagnosis not present

## 2020-07-13 DIAGNOSIS — R059 Cough, unspecified: Secondary | ICD-10-CM | POA: Diagnosis not present

## 2020-07-13 MED ORDER — HYDROCODONE-HOMATROPINE 5-1.5 MG/5ML PO SYRP: 5.0000 mL | ORAL_SOLUTION | Freq: Four times a day (QID) | ORAL | 0 refills | Status: AC | PRN

## 2020-07-13 NOTE — ED Provider Notes (Signed)
[facility]    CSN: [id number] Arrival date & time: 07/13/20  0802      History   Chief Complaint Chief Complaint  Patient presents with  . Cough    HPI Jasmine Cantu is a 79 y.o. female  With extensive history as below presenting for 2-week course of URI symptoms.  Patient endorsing dry cough, nasal congestion, rhinorrhea.  Has tried benzonatate without relief.  Denies chest pain, difficulty breathing, fever.  No known Covid contacts, though requesting testing given holidays.  Past Medical History:  Diagnosis Date  . Allergy   . Anemia   . Cataract   . Collagenous colitis 2004  . Diverticulosis   . Esophageal stricture 1988  . GERD (gastroesophageal reflux disease)   . H. pylori infection   . Hiatal hernia   . Hypertension   . IBS (irritable bowel syndrome)   . Iron deficiency anemia   . Tubular adenoma of colon   . Ulcer   . Vitamin B12 deficiency     Patient Active Problem List   Diagnosis Date Noted  . Reflux 05/31/2012    Past Surgical History:  Procedure Laterality Date  . APPENDECTOMY    . BLADDER SUSPENSION    . CHOLECYSTECTOMY    . EYE SURGERY    . VAGINAL HYSTERECTOMY      OB History   No obstetric history on file.      Home Medications    Prior to Admission medications   Medication Sig Start Date End Date Taking? Authorizing Provider  benazepril (LOTENSIN) 20 MG tablet Take 20 mg by mouth daily.    [provider]  HYDROcodone-homatropine (HYCODAN) 5-1.5 MG/5ML syrup Take 5 mLs by mouth every 6 (six) hours as needed for up to 3 days for cough. 07/13/20 07/16/20  Hall-Potvin, [place], PA-C  Olopatadine HCl 0.2 % SOLN Apply 1 drop to eye daily. 11/19/19   Tasia Catchings, Amy V, PA-C  ipratropium (ATROVENT) 0.06 % nasal spray Place 2 sprays into both nostrils 4 (four) times daily. 08/05/19 11/19/19  Ok Edwards, PA-C    Family History Family History  Problem Relation Age of Onset  . Diabetes Mother   . Colon cancer Mother   .  Stroke Father   . Cancer Sister   . Cancer Brother   . Heart attack Brother   . Cancer Brother   . Heart attack Brother     Social History Social History   Tobacco Use  . Smoking status: Never Smoker  . Smokeless tobacco: Never Used  Vaping Use  . Vaping Use: Never used  Substance Use Topics  . Alcohol use: No  . Drug use: No     Allergies   Codeine and Hydrocodone   Review of Systems Review of Systems  Constitutional: Negative for fatigue and fever.  HENT: Positive for postnasal drip and rhinorrhea. Negative for congestion, dental problem, ear pain, facial swelling, hearing loss, sinus pain, sore throat, trouble swallowing and voice change.   Eyes: Negative for photophobia, pain, redness and visual disturbance.  Respiratory: Positive for cough. Negative for chest tightness, shortness of breath and wheezing.   Cardiovascular: Negative for chest pain and palpitations.  Gastrointestinal: Negative for abdominal pain, diarrhea and vomiting.  Musculoskeletal: Negative for arthralgias and myalgias.  Skin: Negative for rash and wound.  Neurological: Negative for dizziness, syncope and headaches.     Physical Exam Triage Vital Signs ED Triage Vitals  Enc Vitals Group     BP 07/13/20 0825 Marland Kitchen)  154/85     Pulse Rate 07/13/20 0825 87     Resp 07/13/20 0825 18     Temp 07/13/20 0825 98.2 F (36.8 C)     Temp Source 07/13/20 0825 Oral     SpO2 07/13/20 0825 96 %     Weight --      Height --      Head Circumference --      Peak Flow --      Pain Score 07/13/20 0826 5     Pain Loc --      Pain Edu? --      Excl. in [place]? --    No data found.  Updated Vital Signs BP (!) 154/85 (BP Location: Left Arm)   Pulse 87   Temp 98.2 F (36.8 C) (Oral)   Resp 18   SpO2 96%   Visual Acuity Right Eye Distance:   Left Eye Distance:   Bilateral Distance:    Right Eye Near:   Left Eye Near:    Bilateral Near:     Physical Exam Constitutional:      General: She is not in  acute distress.    Appearance: She is not ill-appearing or diaphoretic.  HENT:     Head: Normocephalic and atraumatic.     Right Ear: Tympanic membrane and ear canal normal.     Left Ear: Tympanic membrane and ear canal normal.     Mouth/Throat:     Mouth: Mucous membranes are moist.     Pharynx: Oropharynx is clear. No oropharyngeal exudate or posterior oropharyngeal erythema.  Eyes:     General: No scleral icterus.    Conjunctiva/sclera: Conjunctivae normal.     Pupils: Pupils are equal, round, and reactive to light.  Neck:     Comments: Trachea midline, negative JVD Cardiovascular:     Rate and Rhythm: Normal rate and regular rhythm.     Heart sounds: No murmur heard. No gallop.   Pulmonary:     Effort: Pulmonary effort is normal. No respiratory distress.     Breath sounds: No wheezing, rhonchi or rales.  Musculoskeletal:     Cervical back: Neck supple. No tenderness.  Lymphadenopathy:     Cervical: No cervical adenopathy.  Skin:    Capillary Refill: Capillary refill takes less than 2 seconds.     Coloration: Skin is not jaundiced or pale.     Findings: No rash.  Neurological:     General: No focal deficit present.     Mental Status: She is alert and oriented to person, place, and time.      UC Treatments / Results  Labs (all labs ordered are listed, but only abnormal results are displayed) Labs Reviewed  NOVEL CORONAVIRUS, NAA    EKG   Radiology No results found.  Procedures Procedures (including critical care time)  Medications Ordered in UC Medications - No data to display  Initial Impression / Assessment and Plan / UC Course  I have reviewed the triage vital signs and the nursing notes.  Pertinent labs & imaging results that were available during my care of the patient were reviewed by me and considered in my medical decision making (see chart for details).     Patient afebrile, nontoxic, with SpO2 96%.  Covid PCR pending.  Patient to quarantine  until results are back.  Discussed utility of CXR given chronicity of cough, though cough is dry, cardiopulmonary exam reassuring, and patient declining this.  Requesting Hycodan which she has  tolerated well in the past for persistent cough.  Will prescribe short course, discussed risk/benefits thereof.  Return precautions discussed, patient verbalized understanding and is agreeable to plan. Final Clinical Impressions(s) / UC Diagnoses   Final diagnoses:  Encounter for screening laboratory testing for COVID-19 virus  Cough     Discharge Instructions     Your COVID test is pending - it is important to quarantine / isolate at home until your results are back. If you test positive and would like further evaluation for persistent or worsening symptoms, you may schedule an E-visit or virtual (video) visit throughout the [facility] app or website.  PLEASE NOTE: If you develop severe chest pain or shortness of breath please go to the ER or call [phone] for further evaluation --> DO NOT schedule electronic or virtual visits for this. Please call our office for further guidance / recommendations as needed.  For information about the Covid vaccine, please visit [email or web address]    ED Prescriptions    Medication Sig Dispense Auth. Provider   HYDROcodone-homatropine (HYCODAN) 5-1.5 MG/5ML syrup Take 5 mLs by mouth every 6 (six) hours as needed for up to 3 days for cough. 60 mL Hall-Potvin, [place], PA-C     I have reviewed the PDMP during this encounter.   Neldon Mc [place], [place] 07/13/20 [id number]

## 2020-07-13 NOTE — ED Triage Notes (Signed)
Pt c/o cough and running nose for over 2wks. States now cough is worse and tessalon pearls aren't helping.

## 2020-07-13 NOTE — Discharge Instructions (Addendum)
Your COVID test is pending - it is important to quarantine / isolate at home until your results are back. °If you test positive and would like further evaluation for persistent or worsening symptoms, you may schedule an E-visit or virtual (video) visit throughout the [facility] app or website. ° °PLEASE NOTE: If you develop severe chest pain or shortness of breath please go to the ER or call 9-1-1 for further evaluation --> DO NOT schedule electronic or virtual visits for this. °Please call our office for further guidance / recommendations as needed. ° °For information about the Covid vaccine, please visit [email or web address] °

## 2020-07-15 LAB — NOVEL CORONAVIRUS, NAA: SARS-CoV-2, NAA: NOT DETECTED

## 2020-07-15 LAB — SARS-COV-2, NAA 2 DAY TAT

## 2020-10-27 ENCOUNTER — Ambulatory Visit
Admission: EM | Admit: 2020-10-27 | Discharge: 2020-10-27 | Disposition: A | Discharge status: Home / Self Care | Payer: Medicare Other | Attending: Emergency Medicine | Admitting: Emergency Medicine

## 2020-10-27 ENCOUNTER — Other Ambulatory Visit: Payer: Self-pay

## 2020-10-27 DIAGNOSIS — J069 Acute upper respiratory infection, unspecified: Secondary | ICD-10-CM

## 2020-10-27 DIAGNOSIS — R0981 Nasal congestion: Secondary | ICD-10-CM | POA: Diagnosis not present

## 2020-10-27 MED ORDER — LORATADINE 10 MG PO TABS: 10.0000 mg | ORAL_TABLET | Freq: Every day | ORAL | 0 refills | Status: DC

## 2020-10-27 MED ORDER — BENZONATATE 200 MG PO CAPS: 200.0000 mg | ORAL_CAPSULE | Freq: Three times a day (TID) | ORAL | 0 refills | Status: AC | PRN

## 2020-10-27 MED ORDER — DM-GUAIFENESIN ER 30-600 MG PO TB12: 1.0000 | ORAL_TABLET | Freq: Two times a day (BID) | ORAL | 0 refills | Status: DC

## 2020-10-27 NOTE — ED Provider Notes (Signed)
[facility]    CSN: [id number] Arrival date & time: 10/27/20  0808      History   Chief Complaint Chief Complaint  Patient presents with  . Cough  . Sore Throat  . Shortness of Breath    HPI Jasmine Cantu is a 80 y.o. female history of hypertension, IBS, GERD, presenting today for evaluation of URI symptoms.  Reports cough congestion sore throat for approximately 2 to 3 days.  Denies any known fevers.  Denies chills or body aches.  Has had associated headache and sinus pressure.  Reports poor sleep at night due to congestion and cough.  Denies prior Covid infection.  Denies known exposures.  HPI  Past Medical History:  Diagnosis Date  . Allergy   . Anemia   . Cataract   . Collagenous colitis 2004  . Diverticulosis   . Esophageal stricture 1988  . GERD (gastroesophageal reflux disease)   . H. pylori infection   . Hiatal hernia   . Hypertension   . IBS (irritable bowel syndrome)   . Iron deficiency anemia   . Tubular adenoma of colon   . Ulcer   . Vitamin B12 deficiency     Patient Active Problem List   Diagnosis Date Noted  . Reflux 05/31/2012    Past Surgical History:  Procedure Laterality Date  . APPENDECTOMY    . BLADDER SUSPENSION    . CHOLECYSTECTOMY    . EYE SURGERY    . VAGINAL HYSTERECTOMY      OB History   No obstetric history on file.      Home Medications    Prior to Admission medications   Medication Sig Start Date End Date Taking? Authorizing Provider  benzonatate (TESSALON) 200 MG capsule Take 1 capsule (200 mg total) by mouth 3 (three) times daily as needed for up to 7 days for cough. 10/27/20 11/03/20 Yes Jalyn Dutta C, PA-C  dextromethorphan-guaiFENesin (MUCINEX DM) 30-600 MG 12hr tablet Take 1 tablet by mouth 2 (two) times daily. 10/27/20  Yes Dajon Rowe C, PA-C  loratadine (CLARITIN) 10 MG tablet Take 1 tablet (10 mg total) by mouth daily. 10/27/20  Yes Wilbert Schouten C, PA-C  benazepril (LOTENSIN) 20 MG tablet Take 20  mg by mouth daily.    [provider]  Olopatadine HCl 0.2 % SOLN Apply 1 drop to eye daily. 11/19/19   Tasia Catchings, Amy V, PA-C  ipratropium (ATROVENT) 0.06 % nasal spray Place 2 sprays into both nostrils 4 (four) times daily. 08/05/19 11/19/19  Ok Edwards, PA-C    Family History Family History  Problem Relation Age of Onset  . Diabetes Mother   . Colon cancer Mother   . Stroke Father   . Cancer Sister   . Cancer Brother   . Heart attack Brother   . Cancer Brother   . Heart attack Brother     Social History Social History   Tobacco Use  . Smoking status: Never Smoker  . Smokeless tobacco: Never Used  Vaping Use  . Vaping Use: Never used  Substance Use Topics  . Alcohol use: No  . Drug use: No     Allergies   Codeine and Hydrocodone   Review of Systems Review of Systems  Constitutional: Negative for activity change, appetite change, chills, fatigue and fever.  HENT: Positive for congestion, rhinorrhea and sore throat. Negative for ear pain, sinus pressure and trouble swallowing.   Eyes: Negative for discharge and redness.  Respiratory: Positive for cough.  Negative for chest tightness and shortness of breath.   Cardiovascular: Negative for chest pain.  Gastrointestinal: Negative for abdominal pain, diarrhea, nausea and vomiting.  Musculoskeletal: Negative for myalgias.  Skin: Negative for rash.  Neurological: Negative for dizziness, light-headedness and headaches.     Physical Exam Triage Vital Signs ED Triage Vitals  Enc Vitals Group     BP      Pulse      Resp      Temp      Temp src      SpO2      Weight      Height      Head Circumference      Peak Flow      Pain Score      Pain Loc      Pain Edu?      Excl. in [place]?    No data found.  Updated Vital Signs BP 135/76 (BP Location: Left Arm)   Pulse 85   Temp 99 F (37.2 C) (Oral)   Resp 16   SpO2 95%   Visual Acuity Right Eye Distance:   Left Eye Distance:   Bilateral Distance:    Right  Eye Near:   Left Eye Near:    Bilateral Near:     Physical Exam Vitals and nursing note reviewed.  Constitutional:      Appearance: She is well-developed.     Comments: No acute distress  HENT:     Head: Normocephalic and atraumatic.     Ears:     Comments: Bilateral ears without tenderness to palpation of external auricle, tragus and mastoid, EAC's without erythema or swelling, TM's with good bony landmarks and cone of light. Non erythematous.     Nose: Nose normal.     Mouth/Throat:     Comments: Oral mucosa pink and moist, no tonsillar enlargement or exudate. Posterior pharynx patent and nonerythematous, no uvula deviation or swelling. Normal phonation. Eyes:     Conjunctiva/sclera: Conjunctivae normal.  Cardiovascular:     Rate and Rhythm: Normal rate and regular rhythm.  Pulmonary:     Effort: Pulmonary effort is normal. No respiratory distress.     Comments: Breathing comfortably at rest, CTABL, no wheezing, rales or other adventitious sounds auscultated Abdominal:     General: There is no distension.  Musculoskeletal:        General: Normal range of motion.     Cervical back: Neck supple.  Skin:    General: Skin is warm and dry.  Neurological:     Mental Status: She is alert and oriented to person, place, and time.      UC Treatments / Results  Labs (all labs ordered are listed, but only abnormal results are displayed) Labs Reviewed  NOVEL CORONAVIRUS, NAA    EKG   Radiology No results found.  Procedures Procedures (including critical care time)  Medications Ordered in UC Medications - No data to display  Initial Impression / Assessment and Plan / UC Course  I have reviewed the triage vital signs and the nursing notes.  Pertinent labs & imaging results that were available during my care of the patient were reviewed by me and considered in my medical decision making (see chart for details).     Viral URI with cough-Covid test pending, symptoms x2  days, vital signs stable, exam reassuring, lungs clear.  Recommending continued symptomatic and supportive care suspect likely viral etiology.  Rest and fluids.  Discussed strict return precautions.  Patient verbalized understanding and is agreeable with plan.  Final Clinical Impressions(s) / UC Diagnoses   Final diagnoses:  Nasal congestion  Viral URI with cough     Discharge Instructions     Covid test pending, we will only call if positive Begin daily loratadine/Claritin up with congestion and postnasal drainage Tessalon/benzonatate every 8 hours for cough May use Mucinex DM twice daily for further cough and congestion relief Rest and fluids Follow-up if not improving or worsening    ED Prescriptions    Medication Sig Dispense Auth. Provider   loratadine (CLARITIN) 10 MG tablet Take 1 tablet (10 mg total) by mouth daily. 15 tablet Alfreda Hammad C, PA-C   benzonatate (TESSALON) 200 MG capsule Take 1 capsule (200 mg total) by mouth 3 (three) times daily as needed for up to 7 days for cough. 28 capsule Teva Bronkema C, PA-C   dextromethorphan-guaiFENesin (MUCINEX DM) 30-600 MG 12hr tablet Take 1 tablet by mouth 2 (two) times daily. 20 tablet Ardian Haberland, [place] C, PA-C     PDMP not reviewed this encounter.   Joneen Caraway [place] C, PA-C 10/27/20 [phone]

## 2020-10-27 NOTE — Discharge Instructions (Signed)
Covid test pending, we will only call if positive Begin daily loratadine/Claritin up with congestion and postnasal drainage Tessalon/benzonatate every 8 hours for cough May use Mucinex DM twice daily for further cough and congestion relief Rest and fluids Follow-up if not improving or worsening

## 2020-10-27 NOTE — ED Triage Notes (Signed)
Pt is here with a cough, congestion, sore throat and chest soreness from coughing for 2 days now, pt has not taken any meds to relieve discomfort.

## 2020-10-28 LAB — NOVEL CORONAVIRUS, NAA: SARS-CoV-2, NAA: NOT DETECTED

## 2020-10-28 LAB — SARS-COV-2, NAA 2 DAY TAT

## 2021-02-18 ENCOUNTER — Other Ambulatory Visit: Payer: Self-pay

## 2021-02-18 ENCOUNTER — Ambulatory Visit
Admission: EM | Admit: 2021-02-18 | Discharge: 2021-02-18 | Disposition: A | Discharge status: Home / Self Care | Payer: Medicare Other | Attending: Family Medicine | Admitting: Family Medicine

## 2021-02-18 ENCOUNTER — Encounter: Payer: Self-pay | Admitting: Emergency Medicine

## 2021-02-18 DIAGNOSIS — J069 Acute upper respiratory infection, unspecified: Secondary | ICD-10-CM

## 2021-02-18 DIAGNOSIS — U071 COVID-19: Secondary | ICD-10-CM | POA: Diagnosis not present

## 2021-02-18 MED ORDER — PROMETHAZINE-DM 6.25-15 MG/5ML PO SYRP: 5.0000 mL | ORAL_SOLUTION | Freq: Three times a day (TID) | ORAL | 0 refills | Status: DC | PRN

## 2021-02-18 MED ORDER — PREDNISONE 20 MG PO TABS: 20.0000 mg | ORAL_TABLET | Freq: Every day | ORAL | 0 refills | Status: AC

## 2021-02-18 MED ORDER — MOLNUPIRAVIR EUA 200MG CAPSULE: 4.0000 | ORAL_CAPSULE | Freq: Two times a day (BID) | ORAL | 0 refills | Status: AC

## 2021-02-18 NOTE — ED Provider Notes (Signed)
[facility]    CSN: [id number] Arrival date & time: 02/18/21  0906      History   Chief Complaint Chief Complaint  Patient presents with   Headache   Chest Pain    HPI Jasmine Cantu is a 80 y.o. female.   HPI Patient presents today with cough, headache, congestion and chest tightness with breathing.  Patient has also had intermittent fevers.  Patient took a home COVID test today and was positive for COVID-19 virus.  She is fully vaccinated against COVID-19.  She has been attempting to manage symptoms at home without any significant relief.  Patient has no significant underlying condition increase in the risk of complications of [id number] however due to age would benefit from antiviral therapy.  Past Medical History:  Diagnosis Date   Allergy    Anemia    Cataract    Collagenous colitis 2004   Diverticulosis    Esophageal stricture 1988   GERD (gastroesophageal reflux disease)    H. pylori infection    Hiatal hernia    Hypertension    IBS (irritable bowel syndrome)    Iron deficiency anemia    Tubular adenoma of colon    Ulcer    Vitamin B12 deficiency     Patient Active Problem List   Diagnosis Date Noted   Reflux 05/31/2012    Past Surgical History:  Procedure Laterality Date   APPENDECTOMY     BLADDER SUSPENSION     CHOLECYSTECTOMY     EYE SURGERY     VAGINAL HYSTERECTOMY      OB History   No obstetric history on file.      Home Medications    Prior to Admission medications   Medication Sig Start Date End Date Taking? Authorizing Provider  benazepril (LOTENSIN) 20 MG tablet Take 20 mg by mouth daily.   Yes [provider]  molnupiravir EUA 200 mg CAPS Take 4 capsules (800 mg total) by mouth 2 (two) times daily for 5 days. 02/18/21 02/23/21 Yes Scot Jun, FNP  predniSONE (DELTASONE) 20 MG tablet Take 1 tablet (20 mg total) by mouth daily with breakfast for 5 days. 02/18/21 02/23/21 Yes Scot Jun, FNP   promethazine-dextromethorphan (PROMETHAZINE-DM) 6.25-15 MG/5ML syrup Take 5 mLs by mouth 3 (three) times daily as needed for cough. 02/18/21  Yes Scot Jun, FNP  dextromethorphan-guaiFENesin [facility] DM) 30-600 MG 12hr tablet Take 1 tablet by mouth 2 (two) times daily. 10/27/20   Wieters, Hallie C, PA-C  loratadine (CLARITIN) 10 MG tablet Take 1 tablet (10 mg total) by mouth daily. 10/27/20   Wieters, Hallie C, PA-C  Olopatadine HCl 0.2 % SOLN Apply 1 drop to eye daily. 11/19/19   Tasia Catchings, Amy V, PA-C  ipratropium (ATROVENT) 0.06 % nasal spray Place 2 sprays into both nostrils 4 (four) times daily. 08/05/19 11/19/19  Ok Edwards, PA-C    Family History Family History  Problem Relation Age of Onset   Diabetes Mother    Colon cancer Mother    Stroke Father    Cancer Sister    Cancer Brother    Heart attack Brother    Cancer Brother    Heart attack Brother     Social History Social History   Tobacco Use   Smoking status: Never   Smokeless tobacco: Never  Vaping Use   Vaping Use: Never used  Substance Use Topics   Alcohol use: No   Drug use: No     Allergies  Codeine and Hydrocodone   Review of Systems Review of Systems Pertinent negatives listed in HPI   Physical Exam Triage Vital Signs ED Triage Vitals [02/18/21 0918]  Enc Vitals Group     BP 120/63     Pulse Rate 79     Resp 16     Temp 98.6 F (37 C)     Temp Source Oral     SpO2 96 %     Weight      Height      Head Circumference      Peak Flow      Pain Score 0     Pain Loc      Pain Edu?      Excl. in [place]?    No data found.  Updated Vital Signs BP 120/63 (BP Location: Left Arm)   Pulse 79   Temp 98.6 F (37 C) (Oral)   Resp 16   SpO2 96%   Visual Acuity Right Eye Distance:   Left Eye Distance:   Bilateral Distance:    Right Eye Near:   Left Eye Near:    Bilateral Near:     Physical Exam General appearance: alert, Ill-appearing, no distress Head: Normocephalic, without obvious  abnormality, atraumatic ENT: Normocephalic, bilateral ears normal, nares with mucosal edema, congestion, oropharynx patent  Respiratory: Respirations even , unlabored, coarse lung sound, negative wheezing Heart: Rate and rhythm normal. No gallop or murmurs noted on exam  Extremities: No gross deformities Skin: Skin color, texture, turgor normal. No rashes seen  Psych: Appropriate mood and affect. Neurologic:GCS 15, normal coordination, normal gait  UC Treatments / Results  Labs (all labs ordered are listed, but only abnormal results are displayed) Labs Reviewed - No data to display  EKG   Radiology No results found.  Procedures Procedures (including critical care time)  Medications Ordered in UC Medications - No data to display  Initial Impression / Assessment and Plan / UC Course  I have reviewed the triage vital signs and the nursing notes.  Pertinent labs & imaging results that were available during my care of the patient were reviewed by me and considered in my medical decision making (see chart for details).    COVID-19 virus resulting in a viral upper respiratory illness with cough.  Treatment per discharge instruction orders.  We will initiate antiviral therapy to shorten the course of COVID-19 virus.  Explained to patient medication is not curative however shortens the course and reduces risk of complications associated with COVID-19.  Symptom management also warranted.  EKG today negative for any acute ST changes.  Lung exam reassuring no concern for underlying pneumonia at present.  ER precautions given if any of her symptoms worsen or do not readily improve.  Final Clinical Impressions(s) / UC Diagnoses   Final diagnoses:  [id number] virus infection  Viral URI with cough     Discharge Instructions      If any of your symptoms worsen or you develop any severe chest pain or difficulty breathing go to the nearest emergency department.  He had been prescribed  antiviral therapy to shorten the course of COVID.  Start medication today as is most effective if taken within 5 days of onset of symptoms. Hydrate well with fluids.  Start prednisone 20 mg once daily for 5 days to help with inflammation which is causing chest tightness and cough.  For management of acute cough Promethazine DM take 3 times daily as needed.  ED Prescriptions     Medication Sig Dispense Auth. Provider   molnupiravir EUA 200 mg CAPS Take 4 capsules (800 mg total) by mouth 2 (two) times daily for 5 days. 40 capsule Scot Jun, FNP   promethazine-dextromethorphan (PROMETHAZINE-DM) 6.25-15 MG/5ML syrup Take 5 mLs by mouth 3 (three) times daily as needed for cough. 140 mL Scot Jun, FNP   predniSONE (DELTASONE) 20 MG tablet Take 1 tablet (20 mg total) by mouth daily with breakfast for 5 days. 5 tablet Scot Jun, FNP      PDMP not reviewed this encounter.   Scot Jun, FNP 02/18/21 1046

## 2021-02-18 NOTE — ED Triage Notes (Signed)
Positive home covid test. Began having symptoms Monday. Headache, states chest feels tight when she breathes, runny nose, dry cough, sore throat.

## 2021-02-18 NOTE — Discharge Instructions (Addendum)
If any of your symptoms worsen or you develop any severe chest pain or difficulty breathing go to the nearest emergency department.  He had been prescribed antiviral therapy to shorten the course of COVID.  Start medication today as is most effective if taken within 5 days of onset of symptoms. Hydrate well with fluids.  Start prednisone 20 mg once daily for 5 days to help with inflammation which is causing chest tightness and cough.  For management of acute cough Promethazine DM take 3 times daily as needed.

## 2021-02-25 ENCOUNTER — Ambulatory Visit
Admission: EM | Admit: 2021-02-25 | Discharge: 2021-02-25 | Disposition: A | Discharge status: Home / Self Care | Payer: Medicare Other | Attending: Urgent Care | Admitting: Urgent Care

## 2021-02-25 ENCOUNTER — Ambulatory Visit (INDEPENDENT_AMBULATORY_CARE_PROVIDER_SITE_OTHER): Payer: Medicare Other

## 2021-02-25 DIAGNOSIS — R059 Cough, unspecified: Secondary | ICD-10-CM

## 2021-02-25 DIAGNOSIS — K449 Diaphragmatic hernia without obstruction or gangrene: Secondary | ICD-10-CM

## 2021-02-25 DIAGNOSIS — R079 Chest pain, unspecified: Secondary | ICD-10-CM

## 2021-02-25 DIAGNOSIS — R0789 Other chest pain: Secondary | ICD-10-CM

## 2021-02-25 DIAGNOSIS — U071 COVID-19: Secondary | ICD-10-CM

## 2021-02-25 NOTE — ED Provider Notes (Signed)
[facility]   MRN: [id number] DOB: [birth year]  Subjective:   Jasmine Cantu is a 80 y.o. female presenting for 10 day history of persistent chest tightness.  Patient would like to have a chest x-ray to make sure her lungs are doing okay as she continues to have chest tightness bilaterally.  She did test positive with a home test for COVID-19, was seen here on 02/18/2021.  She is fully vaccinated against COVID-19.  She initially had symptoms of coughing and a sore throat which have improved.  However she is concerned about her ongoing chest symptoms worse with taking a deep breath.  She did take a prednisone course, molnupiravir. No history of respiratory disorders, asthma, restrictive lung disease, smoking.  She has not had to use inhalers. No chest pain, shob.   No current facility-administered medications for this encounter.  Current Outpatient Medications:    benazepril (LOTENSIN) 20 MG tablet, Take 20 mg by mouth daily., Disp: , Rfl:    dextromethorphan-guaiFENesin (MUCINEX DM) 30-600 MG 12hr tablet, Take 1 tablet by mouth 2 (two) times daily., Disp: 20 tablet, Rfl: 0   loratadine (CLARITIN) 10 MG tablet, Take 1 tablet (10 mg total) by mouth daily., Disp: 15 tablet, Rfl: 0   Olopatadine HCl 0.2 % SOLN, Apply 1 drop to eye daily., Disp: 2.5 mL, Rfl: 0   promethazine-dextromethorphan (PROMETHAZINE-DM) 6.25-15 MG/5ML syrup, Take 5 mLs by mouth 3 (three) times daily as needed for cough., Disp: 140 mL, Rfl: 0   Allergies  Allergen Reactions   Doxycycline Nausea And Vomiting   Codeine Other (See Comments)    GI upset   Hydrocodone Nausea Only    Past Medical History:  Diagnosis Date   Allergy    Anemia    Cataract    Collagenous colitis 2004   Diverticulosis    Esophageal stricture 1988   GERD (gastroesophageal reflux disease)    H. pylori infection    Hiatal hernia    Hypertension    IBS (irritable bowel syndrome)    Iron deficiency anemia    Tubular adenoma of colon     Ulcer    Vitamin B12 deficiency      Past Surgical History:  Procedure Laterality Date   APPENDECTOMY     BLADDER SUSPENSION     CHOLECYSTECTOMY     EYE SURGERY     VAGINAL HYSTERECTOMY      Family History  Problem Relation Age of Onset   Diabetes Mother    Colon cancer Mother    Stroke Father    Cancer Sister    Cancer Brother    Heart attack Brother    Cancer Brother    Heart attack Brother     Social History   Tobacco Use   Smoking status: Never   Smokeless tobacco: Never  Vaping Use   Vaping Use: Never used  Substance Use Topics   Alcohol use: No   Drug use: No    ROS   Objective:   Vitals: BP 136/81 (BP Location: Left Arm)   Pulse 65   Temp 98.4 F (36.9 C) (Oral)   Resp 18   SpO2 97%   Physical Exam Constitutional:      General: She is not in acute distress.    Appearance: Normal appearance. She is well-developed. She is not ill-appearing, toxic-appearing or diaphoretic.  HENT:     Head: Normocephalic and atraumatic.     Nose: Nose normal.     Mouth/Throat:  Mouth: Mucous membranes are moist.  Eyes:     Extraocular Movements: Extraocular movements intact.     Pupils: Pupils are equal, round, and reactive to light.  Cardiovascular:     Rate and Rhythm: Normal rate and regular rhythm.     Pulses: Normal pulses.     Heart sounds: Normal heart sounds. No murmur heard.   No friction rub. No gallop.  Pulmonary:     Effort: Pulmonary effort is normal. No respiratory distress.     Breath sounds: Normal breath sounds. No stridor. No wheezing, rhonchi or rales.  Skin:    General: Skin is warm and dry.     Findings: No rash.  Neurological:     Mental Status: She is alert and oriented to person, place, and time.  Psychiatric:        Mood and Affect: Mood normal.        Behavior: Behavior normal.        Thought Content: Thought content normal.    DG Chest 2 View  Result Date: 02/25/2021 CLINICAL DATA:  80 year old female with history of  cough and chest tightness. EXAM: CHEST - 2 VIEW COMPARISON:  Chest x-ray 12/06/2018. FINDINGS: Lung volumes are normal. No consolidative airspace disease. No pleural effusions. No pneumothorax. No pulmonary nodule or mass noted. Large hiatal hernia. Pulmonary vasculature and the cardiomediastinal silhouette are otherwise within normal limits. Atherosclerotic calcifications in the thoracic aorta. IMPRESSION: 1.  No radiographic evidence of acute cardiopulmonary disease. 2. Aortic atherosclerosis. 3. Large hiatal hernia. Electronically Signed   By: Vinnie Langton M.D.   On: 02/25/2021 10:30     Assessment and Plan :   PDMP not reviewed this encounter.  1. COVID-19   2. Chest tightness   3. Hiatal hernia     Counseled that I suspect her hiatal hernia is one of the sources of her chest tightness.  She has previously had this issue but has not felt discomfort from it over the years.  Recommended follow-up with general surgery.  Negative chest x-ray, clear cardiopulmonary exam. Reassured patient, recommended continued supportive care. As there is no history of asthma, restrictive lung disease will defer albuterol inhaler. Counseled patient on potential for adverse effects with medications prescribed/recommended today, ER and return-to-clinic precautions discussed, patient verbalized understanding.    Jaynee Eagles, PA-C 02/25/21 1042

## 2021-02-25 NOTE — ED Triage Notes (Signed)
Pt was last seen at [facility] on 7/30 with covid sxs and chest tightness. Pt reports that she took all prescribed meds and covid sxs (cough, sore throat etc) have resolved, but the chest tightness continues. She c/o chest tightness when she takes deep breaths. Denies sob and chest pain. No previous inhaler use. Tightness is bilateral.

## 2021-04-03 ENCOUNTER — Other Ambulatory Visit: Payer: Self-pay | Admitting: Physician Assistant

## 2021-04-03 DIAGNOSIS — Z86010 Personal history of colonic polyps: Secondary | ICD-10-CM

## 2021-10-27 ENCOUNTER — Ambulatory Visit: Payer: Self-pay

## 2021-12-11 ENCOUNTER — Other Ambulatory Visit: Payer: Self-pay | Admitting: Family Medicine

## 2021-12-11 DIAGNOSIS — M8580 Other specified disorders of bone density and structure, unspecified site: Secondary | ICD-10-CM

## 2021-12-25 ENCOUNTER — Other Ambulatory Visit: Payer: Self-pay | Admitting: Physician Assistant

## 2021-12-25 DIAGNOSIS — Z86010 Personal history of colonic polyps: Secondary | ICD-10-CM

## 2022-01-08 ENCOUNTER — Encounter (HOSPITAL_COMMUNITY): Admission: EM | Disposition: A | Discharge status: Home / Self Care | Payer: Self-pay | Source: Home / Self Care

## 2022-01-08 ENCOUNTER — Encounter (HOSPITAL_BASED_OUTPATIENT_CLINIC_OR_DEPARTMENT_OTHER): Payer: Self-pay | Admitting: Urology

## 2022-01-08 ENCOUNTER — Inpatient Hospital Stay: Admit: 2022-01-08 | Payer: Medicare Other | Admitting: General Surgery

## 2022-01-08 ENCOUNTER — Emergency Department (HOSPITAL_BASED_OUTPATIENT_CLINIC_OR_DEPARTMENT_OTHER): Payer: Medicare Other

## 2022-01-08 ENCOUNTER — Inpatient Hospital Stay (HOSPITAL_BASED_OUTPATIENT_CLINIC_OR_DEPARTMENT_OTHER)
Admission: EM | Admit: 2022-01-08 | Discharge: 2022-01-15 | DRG: 330 | Disposition: A | Discharge status: Home / Self Care | Payer: Medicare Other | Attending: General Surgery | Admitting: General Surgery

## 2022-01-08 ENCOUNTER — Emergency Department (HOSPITAL_COMMUNITY): Payer: Medicare Other | Admitting: Anesthesiology

## 2022-01-08 ENCOUNTER — Other Ambulatory Visit: Payer: Self-pay

## 2022-01-08 DIAGNOSIS — K6389 Other specified diseases of intestine: Secondary | ICD-10-CM | POA: Diagnosis not present

## 2022-01-08 DIAGNOSIS — K559 Vascular disorder of intestine, unspecified: Secondary | ICD-10-CM | POA: Diagnosis present

## 2022-01-08 DIAGNOSIS — D649 Anemia, unspecified: Secondary | ICD-10-CM

## 2022-01-08 DIAGNOSIS — Z823 Family history of stroke: Secondary | ICD-10-CM

## 2022-01-08 DIAGNOSIS — I10 Essential (primary) hypertension: Secondary | ICD-10-CM | POA: Diagnosis present

## 2022-01-08 DIAGNOSIS — Z90710 Acquired absence of both cervix and uterus: Secondary | ICD-10-CM

## 2022-01-08 DIAGNOSIS — K219 Gastro-esophageal reflux disease without esophagitis: Secondary | ICD-10-CM | POA: Diagnosis present

## 2022-01-08 DIAGNOSIS — K5700 Diverticulitis of small intestine with perforation and abscess without bleeding: Secondary | ICD-10-CM | POA: Diagnosis present

## 2022-01-08 DIAGNOSIS — Z79899 Other long term (current) drug therapy: Secondary | ICD-10-CM | POA: Diagnosis not present

## 2022-01-08 DIAGNOSIS — Z833 Family history of diabetes mellitus: Secondary | ICD-10-CM | POA: Diagnosis not present

## 2022-01-08 DIAGNOSIS — Z800 Family history of malignant neoplasm of digestive organs: Secondary | ICD-10-CM

## 2022-01-08 DIAGNOSIS — E538 Deficiency of other specified B group vitamins: Secondary | ICD-10-CM | POA: Diagnosis present

## 2022-01-08 DIAGNOSIS — E871 Hypo-osmolality and hyponatremia: Secondary | ICD-10-CM | POA: Diagnosis present

## 2022-01-08 DIAGNOSIS — Z8249 Family history of ischemic heart disease and other diseases of the circulatory system: Secondary | ICD-10-CM

## 2022-01-08 DIAGNOSIS — K631 Perforation of intestine (nontraumatic): Secondary | ICD-10-CM | POA: Diagnosis present

## 2022-01-08 DIAGNOSIS — K449 Diaphragmatic hernia without obstruction or gangrene: Secondary | ICD-10-CM

## 2022-01-08 DIAGNOSIS — K567 Ileus, unspecified: Secondary | ICD-10-CM | POA: Diagnosis not present

## 2022-01-08 HISTORY — PX: LAPAROTOMY: SHX154

## 2022-01-08 LAB — CBC WITH DIFFERENTIAL/PLATELET
Abs Immature Granulocytes: 0.04 10*3/uL (ref 0.00–0.07)
Basophils Absolute: 0 10*3/uL (ref 0.0–0.1)
Basophils Relative: 0 %
Eosinophils Absolute: 0 10*3/uL (ref 0.0–0.5)
Eosinophils Relative: 0 %
HCT: 33.1 % — ABNORMAL LOW (ref 36.0–46.0)
Hemoglobin: 10.9 g/dL — ABNORMAL LOW (ref 12.0–15.0)
Immature Granulocytes: 0 %
Lymphocytes Relative: 7 %
Lymphs Abs: 0.8 10*3/uL (ref 0.7–4.0)
MCH: 28.1 pg (ref 26.0–34.0)
MCHC: 32.9 g/dL (ref 30.0–36.0)
MCV: 85.3 fL (ref 80.0–100.0)
Monocytes Absolute: 0.7 10*3/uL (ref 0.1–1.0)
Monocytes Relative: 7 %
Neutro Abs: 9.3 10*3/uL — ABNORMAL HIGH (ref 1.7–7.7)
Neutrophils Relative %: 86 %
Platelets: 173 10*3/uL (ref 150–400)
RBC: 3.88 MIL/uL (ref 3.87–5.11)
RDW: 13.7 % (ref 11.5–15.5)
WBC: 10.9 10*3/uL — ABNORMAL HIGH (ref 4.0–10.5)
nRBC: 0 % (ref 0.0–0.2)

## 2022-01-08 LAB — LACTIC ACID, PLASMA: Lactic Acid, Venous: 0.9 mmol/L (ref 0.5–1.9)

## 2022-01-08 LAB — COMPREHENSIVE METABOLIC PANEL
ALT: 8 U/L (ref 0–44)
AST: 13 U/L — ABNORMAL LOW (ref 15–41)
Albumin: 3.6 g/dL (ref 3.5–5.0)
Alkaline Phosphatase: 105 U/L (ref 38–126)
Anion gap: 8 (ref 5–15)
BUN: 18 mg/dL (ref 8–23)
CO2: 25 mmol/L (ref 22–32)
Calcium: 8.9 mg/dL (ref 8.9–10.3)
Chloride: 99 mmol/L (ref 98–111)
Creatinine, Ser: 1.1 mg/dL — ABNORMAL HIGH (ref 0.44–1.00)
GFR, Estimated: 50 mL/min — ABNORMAL LOW (ref >60)
Glucose, Bld: 118 mg/dL — ABNORMAL HIGH (ref 70–99)
Potassium: 4 mmol/L (ref 3.5–5.1)
Sodium: 132 mmol/L — ABNORMAL LOW (ref 135–145)
Total Bilirubin: 0.9 mg/dL (ref 0.3–1.2)
Total Protein: 6.7 g/dL (ref 6.5–8.1)

## 2022-01-08 LAB — URINALYSIS, ROUTINE W REFLEX MICROSCOPIC
Bilirubin Urine: NEGATIVE
Glucose, UA: NEGATIVE mg/dL
Ketones, ur: NEGATIVE mg/dL
Nitrite: NEGATIVE
Protein, ur: NEGATIVE mg/dL
Specific Gravity, Urine: <=1.005 (ref 1.005–1.030)
pH: 6.5 (ref 5.0–8.0)

## 2022-01-08 LAB — URINALYSIS, MICROSCOPIC (REFLEX)

## 2022-01-08 LAB — LIPASE, BLOOD: Lipase: 28 U/L (ref 11–51)

## 2022-01-08 LAB — OCCULT BLOOD X 1 CARD TO LAB, STOOL: Fecal Occult Bld: NEGATIVE

## 2022-01-08 SURGERY — LAPAROTOMY, EXPLORATORY
Anesthesia: General

## 2022-01-08 MED ORDER — GLYCOPYRROLATE PF 0.2 MG/ML IJ SOSY
PREFILLED_SYRINGE | INTRAMUSCULAR | Status: DC | PRN
  Administered 2022-01-08: .1 mg via INTRAVENOUS

## 2022-01-08 MED ORDER — SUGAMMADEX SODIUM 200 MG/2ML IV SOLN
INTRAVENOUS | Status: DC | PRN
  Administered 2022-01-08: 300 mg via INTRAVENOUS

## 2022-01-08 MED ORDER — FENTANYL CITRATE PF 50 MCG/ML IJ SOSY
PREFILLED_SYRINGE | INTRAMUSCULAR | Status: AC
  Filled 2022-01-08: qty 1

## 2022-01-08 MED ORDER — KCL IN DEXTROSE-NACL 20-5-0.45 MEQ/L-%-% IV SOLN
INTRAVENOUS | Status: DC
  Filled 2022-01-08 (×7): qty 1000

## 2022-01-08 MED ORDER — TRAMADOL HCL 50 MG PO TABS
50.0000 mg | ORAL_TABLET | Freq: Four times a day (QID) | ORAL | Status: DC | PRN
  Administered 2022-01-08 – 2022-01-09 (×2): 50 mg via ORAL
  Filled 2022-01-08 (×2): qty 1

## 2022-01-08 MED ORDER — DIPHENHYDRAMINE HCL 50 MG/ML IJ SOLN: 12.5000 mg | Freq: Four times a day (QID) | INTRAMUSCULAR | Status: DC | PRN

## 2022-01-08 MED ORDER — METHOCARBAMOL 500 MG PO TABS
500.0000 mg | ORAL_TABLET | Freq: Three times a day (TID) | ORAL | Status: DC | PRN
  Administered 2022-01-09: 500 mg via ORAL
  Filled 2022-01-08: qty 1

## 2022-01-08 MED ORDER — ACETAMINOPHEN 500 MG PO TABS
1000.0000 mg | ORAL_TABLET | Freq: Four times a day (QID) | ORAL | Status: DC
  Administered 2022-01-08 – 2022-01-09 (×3): 1000 mg via ORAL
  Filled 2022-01-08 (×4): qty 2

## 2022-01-08 MED ORDER — HYDRALAZINE HCL 20 MG/ML IJ SOLN: 10.0000 mg | INTRAMUSCULAR | Status: DC | PRN

## 2022-01-08 MED ORDER — 0.9 % SODIUM CHLORIDE (POUR BTL) OPTIME
TOPICAL | Status: DC | PRN
  Administered 2022-01-08: 2000 mL

## 2022-01-08 MED ORDER — DIPHENHYDRAMINE HCL 12.5 MG/5ML PO ELIX: 12.5000 mg | ORAL_SOLUTION | Freq: Four times a day (QID) | ORAL | Status: DC | PRN

## 2022-01-08 MED ORDER — SODIUM CHLORIDE 0.9 % IV SOLN: Freq: Once | INTRAVENOUS | Status: AC

## 2022-01-08 MED ORDER — SIMETHICONE 80 MG PO CHEW
40.0000 mg | CHEWABLE_TABLET | Freq: Four times a day (QID) | ORAL | Status: DC | PRN
  Administered 2022-01-09: 40 mg via ORAL
  Filled 2022-01-08: qty 1

## 2022-01-08 MED ORDER — PIPERACILLIN-TAZOBACTAM 3.375 G IVPB
3.3750 g | Freq: Three times a day (TID) | INTRAVENOUS | Status: DC
  Administered 2022-01-08 – 2022-01-13 (×14): 3.375 g via INTRAVENOUS
  Filled 2022-01-08 (×15): qty 50

## 2022-01-08 MED ORDER — ACETAMINOPHEN 10 MG/ML IV SOLN
INTRAVENOUS | Status: DC | PRN
  Administered 2022-01-08: 1000 mg via INTRAVENOUS

## 2022-01-08 MED ORDER — FENTANYL CITRATE (PF) 100 MCG/2ML IJ SOLN
INTRAMUSCULAR | Status: AC
  Filled 2022-01-08: qty 2

## 2022-01-08 MED ORDER — MELATONIN 3 MG PO TABS: 3.0000 mg | ORAL_TABLET | Freq: Every evening | ORAL | Status: DC | PRN

## 2022-01-08 MED ORDER — FENTANYL CITRATE PF 50 MCG/ML IJ SOSY
25.0000 ug | PREFILLED_SYRINGE | INTRAMUSCULAR | Status: DC | PRN
  Administered 2022-01-08 (×3): 50 ug via INTRAVENOUS

## 2022-01-08 MED ORDER — HYDROMORPHONE HCL 1 MG/ML IJ SOLN
INTRAMUSCULAR | Status: AC
  Filled 2022-01-08: qty 1

## 2022-01-08 MED ORDER — FENTANYL CITRATE (PF) 100 MCG/2ML IJ SOLN
INTRAMUSCULAR | Status: DC | PRN
  Administered 2022-01-08: 50 ug via INTRAVENOUS
  Administered 2022-01-08 (×2): 25 ug via INTRAVENOUS
  Administered 2022-01-08: 100 ug via INTRAVENOUS

## 2022-01-08 MED ORDER — ACETAMINOPHEN 10 MG/ML IV SOLN: 1000.0000 mg | Freq: Once | INTRAVENOUS | Status: DC | PRN

## 2022-01-08 MED ORDER — ONDANSETRON HCL 4 MG/2ML IJ SOLN
4.0000 mg | INTRAMUSCULAR | Status: DC | PRN
  Administered 2022-01-09 – 2022-01-10 (×5): 4 mg via INTRAVENOUS
  Filled 2022-01-08 (×5): qty 2

## 2022-01-08 MED ORDER — PIPERACILLIN-TAZOBACTAM 3.375 G IVPB: 3.3750 g | Freq: Three times a day (TID) | INTRAVENOUS | Status: DC

## 2022-01-08 MED ORDER — HYDROMORPHONE HCL 1 MG/ML IJ SOLN
0.2500 mg | INTRAMUSCULAR | Status: DC | PRN
  Administered 2022-01-08: 0.25 mg via INTRAVENOUS

## 2022-01-08 MED ORDER — FENTANYL CITRATE PF 50 MCG/ML IJ SOSY
PREFILLED_SYRINGE | INTRAMUSCULAR | Status: AC
  Filled 2022-01-08: qty 2

## 2022-01-08 MED ORDER — TRAMADOL HCL 50 MG PO TABS
ORAL_TABLET | ORAL | Status: AC
  Filled 2022-01-08: qty 1

## 2022-01-08 MED ORDER — METHOCARBAMOL 500 MG IVPB - SIMPLE MED
500.0000 mg | Freq: Three times a day (TID) | INTRAVENOUS | Status: DC | PRN
  Administered 2022-01-08 – 2022-01-11 (×2): 500 mg via INTRAVENOUS
  Filled 2022-01-08 (×2): qty 500

## 2022-01-08 MED ORDER — FENTANYL CITRATE PF 50 MCG/ML IJ SOSY
25.0000 ug | PREFILLED_SYRINGE | Freq: Once | INTRAMUSCULAR | Status: AC
  Administered 2022-01-08: 25 ug via INTRAVENOUS
  Filled 2022-01-08: qty 1

## 2022-01-08 MED ORDER — LIDOCAINE 2% (20 MG/ML) 5 ML SYRINGE
INTRAMUSCULAR | Status: DC | PRN
  Administered 2022-01-08: 100 mg via INTRAVENOUS

## 2022-01-08 MED ORDER — LACTATED RINGERS IV SOLN: INTRAVENOUS | Status: DC

## 2022-01-08 MED ORDER — MORPHINE SULFATE (PF) 2 MG/ML IV SOLN
1.0000 mg | INTRAVENOUS | Status: DC | PRN
  Administered 2022-01-08 (×2): 4 mg via INTRAVENOUS
  Administered 2022-01-10 – 2022-01-13 (×7): 2 mg via INTRAVENOUS
  Filled 2022-01-08: qty 1
  Filled 2022-01-08: qty 2
  Filled 2022-01-08 (×4): qty 1
  Filled 2022-01-08: qty 2
  Filled 2022-01-08 (×2): qty 1

## 2022-01-08 MED ORDER — DEXAMETHASONE SODIUM PHOSPHATE 10 MG/ML IJ SOLN
INTRAMUSCULAR | Status: DC | PRN
  Administered 2022-01-08: 4 mg via INTRAVENOUS

## 2022-01-08 MED ORDER — SUCCINYLCHOLINE CHLORIDE 200 MG/10ML IV SOSY
PREFILLED_SYRINGE | INTRAVENOUS | Status: DC | PRN
  Administered 2022-01-08: 100 mg via INTRAVENOUS

## 2022-01-08 MED ORDER — PROPOFOL 10 MG/ML IV BOLUS
INTRAVENOUS | Status: DC | PRN
  Administered 2022-01-08: 80 mg via INTRAVENOUS

## 2022-01-08 MED ORDER — ROCURONIUM BROMIDE 10 MG/ML (PF) SYRINGE
PREFILLED_SYRINGE | INTRAVENOUS | Status: DC | PRN
  Administered 2022-01-08: 50 mg via INTRAVENOUS

## 2022-01-08 MED ORDER — BENAZEPRIL HCL 10 MG PO TABS
20.0000 mg | ORAL_TABLET | Freq: Every day | ORAL | Status: DC
  Administered 2022-01-09: 20 mg via ORAL
  Filled 2022-01-08: qty 2

## 2022-01-08 MED ORDER — ACETAMINOPHEN 10 MG/ML IV SOLN
INTRAVENOUS | Status: AC
  Filled 2022-01-08: qty 100

## 2022-01-08 MED ORDER — IOHEXOL 350 MG/ML SOLN
100.0000 mL | Freq: Once | INTRAVENOUS | Status: AC | PRN
  Administered 2022-01-08: 100 mL via INTRAVENOUS

## 2022-01-08 MED ORDER — BENAZEPRIL HCL 20 MG PO TABS: 20.0000 mg | ORAL_TABLET | Freq: Every day | ORAL | Status: DC

## 2022-01-08 MED ORDER — PIPERACILLIN-TAZOBACTAM 3.375 G IVPB 30 MIN
3.3750 g | Freq: Three times a day (TID) | INTRAVENOUS | Status: DC
  Administered 2022-01-08: 3.375 g via INTRAVENOUS
  Filled 2022-01-08: qty 50

## 2022-01-08 MED ORDER — ENOXAPARIN SODIUM 40 MG/0.4ML IJ SOSY
40.0000 mg | PREFILLED_SYRINGE | INTRAMUSCULAR | Status: DC
  Administered 2022-01-09 – 2022-01-14 (×6): 40 mg via SUBCUTANEOUS
  Filled 2022-01-08 (×6): qty 0.4

## 2022-01-08 MED ORDER — ONDANSETRON HCL 4 MG/2ML IJ SOLN
INTRAMUSCULAR | Status: DC | PRN
  Administered 2022-01-08: 4 mg via INTRAVENOUS

## 2022-01-08 SURGICAL SUPPLY — 46 items
APL PRP STRL LF DISP 70% ISPRP (MISCELLANEOUS) ×1
BAG COUNTER SPONGE SURGICOUNT (BAG) IMPLANT
BAG SPNG CNTER NS LX DISP (BAG)
BIOPATCH WHT 1IN DISK W/4.0 H (GAUZE/BANDAGES/DRESSINGS) ×1 IMPLANT
BLADE EXTENDED COATED 6.5IN (ELECTRODE) IMPLANT
CHLORAPREP W/TINT 26 (MISCELLANEOUS) ×3 IMPLANT
COVER MAYO STAND STRL (DRAPES) ×8 IMPLANT
COVER SURGICAL LIGHT HANDLE (MISCELLANEOUS) ×3 IMPLANT
DRAIN CHANNEL 19F RND (DRAIN) ×1 IMPLANT
DRAPE LAPAROSCOPIC ABDOMINAL (DRAPES) ×3 IMPLANT
DRSG OPSITE POSTOP 4X10 (GAUZE/BANDAGES/DRESSINGS) IMPLANT
DRSG OPSITE POSTOP 4X6 (GAUZE/BANDAGES/DRESSINGS) IMPLANT
DRSG OPSITE POSTOP 4X8 (GAUZE/BANDAGES/DRESSINGS) IMPLANT
DRSG TEGADERM 2-3/8X2-3/4 SM (GAUZE/BANDAGES/DRESSINGS) ×1 IMPLANT
ELECT REM PT RETURN 15FT ADLT (MISCELLANEOUS) ×3 IMPLANT
EVACUATOR SILICONE 100CC (DRAIN) ×1 IMPLANT
GAUZE SPONGE 4X4 12PLY STRL (GAUZE/BANDAGES/DRESSINGS) IMPLANT
GLOVE BIO SURGEON STRL SZ7 (GLOVE) ×6 IMPLANT
GLOVE BIOGEL PI IND STRL 7.5 (GLOVE) ×4 IMPLANT
GLOVE BIOGEL PI INDICATOR 7.5 (GLOVE) ×2
GOWN STRL REUS W/ TWL LRG LVL3 (GOWN DISPOSABLE) ×4 IMPLANT
GOWN STRL REUS W/TWL LRG LVL3 (GOWN DISPOSABLE) ×4
HANDLE SUCTION POOLE (INSTRUMENTS) IMPLANT
KIT TURNOVER KIT A (KITS) IMPLANT
LIGASURE IMPACT 36 18CM CVD LR (INSTRUMENTS) ×1 IMPLANT
PACK COLON (CUSTOM PROCEDURE TRAY) ×2 IMPLANT
PENCIL SMOKE EVACUATOR (MISCELLANEOUS) ×1 IMPLANT
RELOAD PROXIMATE 75MM BLUE (ENDOMECHANICALS) ×4 IMPLANT
RELOAD STAPLE 75 3.8 BLU REG (ENDOMECHANICALS) IMPLANT
STAPLER GUN LINEAR PROX 60 (STAPLE) ×1 IMPLANT
STAPLER PROXIMATE 75MM BLUE (STAPLE) ×1 IMPLANT
STAPLER VISISTAT 35W (STAPLE) ×3 IMPLANT
SUCTION POOLE HANDLE (INSTRUMENTS) ×2
SUT ETHILON 2 0 PS N (SUTURE) ×1 IMPLANT
SUT NOVA NAB GS-21 0 18 T12 DT (SUTURE) IMPLANT
SUT NOVA NAB GS-21 1 T12 (SUTURE) IMPLANT
SUT PDS AB 1 TP1 96 (SUTURE) ×2 IMPLANT
SUT SILK 2 0 (SUTURE) ×2
SUT SILK 2 0 SH CR/8 (SUTURE) ×2 IMPLANT
SUT SILK 2-0 18XBRD TIE 12 (SUTURE) ×2 IMPLANT
SUT SILK 3 0 (SUTURE)
SUT SILK 3 0 SH CR/8 (SUTURE) ×3 IMPLANT
SUT SILK 3-0 18XBRD TIE 12 (SUTURE) ×2 IMPLANT
TOWEL OR 17X26 10 PK STRL BLUE (TOWEL DISPOSABLE) ×1 IMPLANT
TOWEL OR NON WOVEN STRL DISP B (DISPOSABLE) ×2 IMPLANT
TRAY FOLEY MTR SLVR 14FR STAT (SET/KITS/TRAYS/PACK) ×1 IMPLANT

## 2022-01-08 NOTE — Anesthesia Preprocedure Evaluation (Addendum)
Anesthesia Evaluation  Patient identified by MRN, date of birth, ID band Patient awake    Reviewed: Allergy & Precautions, NPO status , Patient's Chart, lab work & pertinent test results  Airway Mallampati: II  TM Distance: >3 FB Neck ROM: Full    Dental no notable dental hx.    Pulmonary neg pulmonary ROS,    Pulmonary exam normal        Cardiovascular hypertension, Pt. on medications  Rhythm:Regular Rate:Normal     Neuro/Psych negative neurological ROS  negative psych ROS   GI/Hepatic Neg liver ROS, hiatal hernia, GERD  ,Ischemic small bowel   Endo/Other  negative endocrine ROS  Renal/GU negative Renal ROS  negative genitourinary   Musculoskeletal negative musculoskeletal ROS (+)   Abdominal Normal abdominal exam  (+)   Peds  Hematology  (+) Blood dyscrasia, anemia ,   Anesthesia Other Findings   Reproductive/Obstetrics                            Anesthesia Physical Anesthesia Plan  ASA: 3 and emergent  Anesthesia Plan: General   Post-op Pain Management:    Induction: Intravenous and Rapid sequence  PONV Risk Score and Plan: 3 and Ondansetron, Dexamethasone and Treatment may vary due to age or medical condition  Airway Management Planned: Mask and Oral ETT  Additional Equipment: None  Intra-op Plan:   Post-operative Plan: Extubation in OR  Informed Consent: I have reviewed the patients History and Physical, chart, labs and discussed the procedure including the risks, benefits and alternatives for the proposed anesthesia with the patient or authorized representative who has indicated his/her understanding and acceptance.     Dental advisory given  Plan Discussed with: CRNA  Anesthesia Plan Comments: (Lab Results      Component                Value               Date                      WBC                      10.9 (H)            01/08/2022                HGB                       10.9 (L)            01/08/2022                HCT                      33.1 (L)            01/08/2022                MCV                      85.3                01/08/2022                PLT                      173  01/08/2022           Lab Results      Component                Value               Date                      NA                       132 (L)             01/08/2022                K                        4.0                 01/08/2022                CO2                      25                  01/08/2022                GLUCOSE                  118 (H)             01/08/2022                BUN                      18                  01/08/2022                CREATININE               1.10 (H)            01/08/2022                CALCIUM                  8.9                 01/08/2022                GFRNONAA                 50 (L)              01/08/2022          )       Anesthesia Quick Evaluation

## 2022-01-08 NOTE — ED Notes (Signed)
Report provided to [facility] for transport, report provided to short stay RN, spoke to [employer]

## 2022-01-08 NOTE — H&P (Signed)
Jasmine Cantu [birth year]  [id number].    Chief Complaint/Reason for Consult: sb ischemia with perforation  HPI:  This is an 81 yo female with a history of HTN and GERD who presented to [facility] ED with worsening LUQ abdominal pain.  She states she has been having intermittent pain in this area for 2-3 years but she has not had it worked up.  Over the last several months it has been worsening until last week it became the worst it has been.  She then began having black and tarry stools.  These were semi-solid and not diarrhea.  She denies any fevers.  She has not been eating well, but has had not any nausea or vomiting.  Due to persistent pain that was not alleviated by anything, she presented for evaluation.  Surprisingly her labs are mostly normal.  WBC is 10.9.  hgb is the same.  Cr is 1.10.  lactic acid is normal.  Her CTA revealed a short segment of ischemia small bowel/jejunum with perforation and small multifocal fluid and gas collection in the mesentery.  We have been asked to see her in transfer here at [facility].  ROS: ROS: see HPI  Family History  Problem Relation Age of Onset   Diabetes Mother    Colon cancer Mother    Stroke Father    Cancer Sister    Cancer Brother    Heart attack Brother    Cancer Brother    Heart attack Brother     Past Medical History:  Diagnosis Date   Allergy    Anemia    Cataract    Collagenous colitis 2004   Diverticulosis    Esophageal stricture 1988   GERD (gastroesophageal reflux disease)    H. pylori infection    Hiatal hernia    Hypertension    IBS (irritable bowel syndrome)    Iron deficiency anemia    Tubular adenoma of colon    Ulcer    Vitamin B12 deficiency     Past Surgical History:  Procedure Laterality Date   APPENDECTOMY     BLADDER SUSPENSION     CHOLECYSTECTOMY     EYE SURGERY     VAGINAL HYSTERECTOMY      Social History:  reports that she has never smoked. She has never used smokeless tobacco. She reports that she does  not drink alcohol and does not use drugs.  Allergies:  Allergies  Allergen Reactions   Doxycycline Nausea And Vomiting   Codeine Other (See Comments)    GI upset   Hydrocodone Nausea Only    (Not in a hospital admission)    Physical Exam: Blood pressure (!) 146/67, pulse 60, temperature 98.4 F (36.9 C), temperature source Oral, resp. rate 11, height '5\' 3"'$  (1.6 m), weight 68 kg, SpO2 99 %. General: pleasant, WD, WN female who is laying in bed in NAD HEENT: head is normocephalic, atraumatic.  Sclera are noninjected.  PERRL.  Ears and nose without any masses or lesions.  Mouth is pink and moist Heart: regular, rate, and rhythm.  Normal s1,s2. No obvious murmurs, gallops, or rubs noted.  Palpable radial and pedal pulses bilaterally Lungs: CTAB, no wheezes, rhonchi, or rales noted.  Respiratory effort nonlabored Abd: soft, tender in LUQ with focal guarding, ND, hypoactive BS, no masses, hernias, or organomegaly.  Paramedian scar noted MS: all 4 extremities are symmetrical with no cyanosis, clubbing, or edema. Skin: warm and dry with no masses, lesions, or rashes Psych: A&Ox3  with an appropriate affect.   Results for orders placed or performed during the hospital encounter of 01/08/22 (from the past 48 hour(s))  Occult blood card to lab, stool     Status: None   Collection Time: 01/08/22  9:49 AM  Result Value Ref Range   Fecal Occult Bld NEGATIVE NEGATIVE    Comment: Performed at [facility], [address] [place], [place] [address]  CBC with Differential     Status: Abnormal   Collection Time: 01/08/22  9:52 AM  Result Value Ref Range   WBC 10.9 (H) 4.0 - 10.5 K/uL   RBC 3.88 3.87 - 5.11 MIL/uL   Hemoglobin 10.9 (L) 12.0 - 15.0 g/dL   HCT 33.1 (L) 36.0 - 46.0 %   MCV 85.3 80.0 - 100.0 fL   MCH 28.1 26.0 - 34.0 pg   MCHC 32.9 30.0 - 36.0 g/dL   RDW 13.7 11.5 - 15.5 %   Platelets 173 150 - 400 K/uL   nRBC 0.0 0.0 - 0.2 %   Neutrophils Relative % 86 %   Neutro  Abs 9.3 (H) 1.7 - 7.7 K/uL   Lymphocytes Relative 7 %   Lymphs Abs 0.8 0.7 - 4.0 K/uL   Monocytes Relative 7 %   Monocytes Absolute 0.7 0.1 - 1.0 K/uL   Eosinophils Relative 0 %   Eosinophils Absolute 0.0 0.0 - 0.5 K/uL   Basophils Relative 0 %   Basophils Absolute 0.0 0.0 - 0.1 K/uL   Immature Granulocytes 0 %   Abs Immature Granulocytes 0.04 0.00 - 0.07 K/uL    Comment: Performed at [facility], [address] [place], [place] [address]  Comprehensive metabolic panel     Status: Abnormal   Collection Time: 01/08/22  9:52 AM  Result Value Ref Range   Sodium 132 (L) 135 - 145 mmol/L   Potassium 4.0 3.5 - 5.1 mmol/L   Chloride 99 98 - 111 mmol/L   CO2 25 22 - 32 mmol/L   Glucose, Bld 118 (H) 70 - 99 mg/dL    Comment: Glucose reference range applies only to samples taken after fasting for at least 8 hours.   BUN 18 8 - 23 mg/dL   Creatinine, Ser 1.10 (H) 0.44 - 1.00 mg/dL   Calcium 8.9 8.9 - 10.3 mg/dL   Total Protein 6.7 6.5 - 8.1 g/dL   Albumin 3.6 3.5 - 5.0 g/dL   AST 13 (L) 15 - 41 U/L   ALT 8 0 - 44 U/L   Alkaline Phosphatase 105 38 - 126 U/L   Total Bilirubin 0.9 0.3 - 1.2 mg/dL   GFR, Estimated 50 (L) >60 mL/min    Comment: (NOTE) Calculated using the CKD-EPI Creatinine Equation (2021)    Anion gap 8 5 - 15    Comment: Performed at [facility], [address], [place], [place] [address]  Lipase, blood     Status: None   Collection Time: 01/08/22  9:52 AM  Result Value Ref Range   Lipase 28 11 - 51 U/L    Comment: Performed at [facility], [address] [place], [place] [address]  Urinalysis, Routine w reflex microscopic Urine, Clean Catch     Status: Abnormal   Collection Time: 01/08/22 10:53 AM  Result Value Ref Range   Color, Urine YELLOW YELLOW   APPearance CLEAR CLEAR   Specific Gravity, Urine <=1.005 1.005 -  1.030   pH 6.5 5.0 - 8.0   Glucose, UA NEGATIVE NEGATIVE mg/dL   Hgb urine  dipstick TRACE (A) NEGATIVE   Bilirubin Urine NEGATIVE NEGATIVE   Ketones, ur NEGATIVE NEGATIVE mg/dL   Protein, ur NEGATIVE NEGATIVE mg/dL   Nitrite NEGATIVE NEGATIVE   Leukocytes,Ua MODERATE (A) NEGATIVE    Comment: Performed at [facility], [address] [place], [place] [address]  Urinalysis, Microscopic (reflex)     Status: Abnormal   Collection Time: 01/08/22 10:53 AM  Result Value Ref Range   RBC / HPF 0-5 0 - 5 RBC/hpf   WBC, UA 21-50 0 - 5 WBC/hpf   Bacteria, UA MANY (A) NONE SEEN   Squamous Epithelial / LPF 0-5 0 - 5    Comment: Performed at [facility], [address] [place], [place] [address]   CT Angio Abd/Pel W and/or Wo Contrast  Result Date: 01/08/2022 CLINICAL DATA:  81 year old female with GI bleeding EXAM: CTA ABDOMEN AND PELVIS WITHOUT AND WITH CONTRAST TECHNIQUE: Multidetector CT imaging of the abdomen and pelvis was performed using the standard protocol during bolus administration of intravenous contrast. Multiplanar reconstructed images and MIPs were obtained and reviewed to evaluate the vascular anatomy. RADIATION DOSE REDUCTION: This exam was performed according to the departmental dose-optimization program which includes automated exposure control, adjustment of the mA and/or kV according to patient size and/or use of iterative reconstruction technique. CONTRAST:  165m OMNIPAQUE IOHEXOL 350 MG/ML SOLN COMPARISON:  No comparison FINDINGS: VASCULAR Aorta: Unremarkable course, caliber, contour of the abdominal aorta. No dissection, aneurysm, or periaortic fluid. Mild atherosclerotic changes of the lower thoracic and the abdominal aorta. Celiac: Mild atherosclerotic changes without high-grade stenosis or occlusion. Branches are patent. SMA: Mild atherosclerotic changes without high-grade stenosis or occlusion. Branches are patent. Renals: - Right: Single right renal artery. Atherosclerotic changes at the origin without high-grade stenosis. - Left:  3 left renal arteries of small caliber without significant atherosclerotic changes at the origin. IMA: Inferior mesenteric artery is patent. Right lower extremity: Unremarkable course, caliber, and contour of the right iliac system. No aneurysm, dissection, or occlusion. Hypogastric artery is patent. Common femoral artery patent. Proximal SFA and profunda femoris patent. Left lower extremity: Unremarkable course, caliber, and contour of the left iliac system. No aneurysm, dissection, or occlusion. Hypogastric artery is patent. Common femoral artery patent. Proximal SFA and profunda femoris patent. Veins: Unremarkable appearance of the venous system. Review of the MIP images confirms the above findings. NON-VASCULAR Lower chest: No acute finding of the lower chest. Hepatobiliary: Unremarkable appearance of the liver. Cholecystectomy Pancreas: Unremarkable. Spleen: Unremarkable. Adrenals/Urinary Tract: - Right adrenal gland: Unremarkable - Left adrenal gland: Small nodule on the medial limb of the left adrenal gland, less than 10 mm, indeterminate on the current CT protocol. Most likely benign, with no further imaging warranted. - Right kidney: No hydronephrosis, nephrolithiasis, inflammation, or ureteral dilation. No focal lesion. - Left Kidney: No hydronephrosis, nephrolithiasis, inflammation, or ureteral dilation. No focal lesion. - Urinary Bladder: Unremarkable. Stomach/Bowel: - Stomach: Hiatal hernia with essentially intrathoracic stomach. No comparison. Small rounded nodule within the adjacent fat measuring 13 mm. This nodule has enhancement pattern that follows the spleen parenchyma. - Small bowel: Dilated proximal small bowel loops. Abnormal small bowel within the left abdomen with circumferential wall thickening within a short segment loop without enhancement/hypoenhancement. Significant edema within the adjacent fat. There are a few small foci of extraluminal gas in air within the mesenteric loops  indicating  perforation. Small lymph nodes within the mesentery. - Appendix: Appendix is not visualized, however, no inflammatory changes are present adjacent to the cecum to indicate an appendicitis. - Colon: Decompressed, with fluid-filled right-sided colon. 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 Lymphatic: Small lymph nodes within the small bowel mesentery. Mesenteric: Reactive free fluid within the pelvis. Reproductive: Hysterectomy Other: No hernia. Musculoskeletal: No displaced fracture. IMPRESSION: Short segment of ischemic small bowel/jejunum in the left abdomen with perforation and small multifocal fluid and gas collections in the mesentery, potentially secondary to a closed loop. Emergency surgical consultation is indicated. Developing small bowel obstruction secondary to the site of ischemic small bowel. These above preliminary results were discussed by telephone at the time of interpretation on 01/08/2022 at 11:59 am with Dr. Madalyn Rob. Reactive free fluid layering within the anatomic pelvis. Atherosclerosis of the abdominal aorta without significant mesenteric arterial disease or renal artery disease. Aortic Atherosclerosis (ICD10-I70.0). Rounded nodule within the fat adjacent to hiatal hernia. This is favored to represent a splenule rather than a lymph node. If confirmation is warranted, consider MRI spleen protocol with inclusion of this site on the forthcoming study. Additional ancillary findings as above. Signed, Dulcy Fanny. Nadene Rubins, RPVI [facility] Electronically Signed   By: Corrie Mckusick D.O.   On: 01/08/2022 12:03      Assessment/Plan Small bowel perforation secondary to ischemia The patient has been seen, examined, chart, labs, vitals, and imaging personally reviewed.  Despite having fairly normal looking labs, her CT scan is certainly convincing for ischemia segment of small bowel with a perforation  and fluid collection in the mesentery along with her symptoms.  She will be admitted and we will plan for an exploratory laparotomy with SBR today.  She is a full code and her children together make medical decisions for her if she is unable to do so.  The procedure has been discussed with patient and she is agreeable to proceed.  FEN - NPO/IVFs VTE - lovenox to start post op ID - [email or web address] Admit - inpatient, likely med surg  HTN - resume home med as able, prn until then  I reviewed ED provider notes, last 24 h vitals and pain scores, last 48 h intake and output, last 24 h labs and trends, and last 24 h imaging results.  Henreitta Cea, [facility] 01/08/2022, 12:51 PM Please see Amion for pager number during day hours 7:00am-4:30pm or 7:00am -11:30am on weekends

## 2022-01-08 NOTE — ED Triage Notes (Signed)
Pt reports abdominal pain since Wednesday, pt states no BM and loss of appetite, per Daughter pt having stool incontinence at home and stools are "black".  D/o diverticulitis and IBS  Pt states " my insides feel raw"

## 2022-01-08 NOTE — Anesthesia Postprocedure Evaluation (Signed)
Anesthesia Post Note  Patient: Jasmine Cantu  Procedure(s) Performed: EXPLORATORY LAPAROTOMY, SMALL BOWEL RESECTION     Patient location during evaluation: PACU Anesthesia Type: General Level of consciousness: awake and alert, oriented and patient cooperative Pain management: pain level controlled Vital Signs Assessment: post-procedure vital signs reviewed and stable Respiratory status: spontaneous breathing, nonlabored ventilation and respiratory function stable Cardiovascular status: blood pressure returned to baseline and stable Postop Assessment: no apparent nausea or vomiting Anesthetic complications: no   No notable events documented.  Last Vitals:  Vitals:   01/08/22 1545 01/08/22 1600  BP: (!) 145/61 133/79  Pulse: (!) 56 63  Resp: 14 16  Temp:    SpO2: 100% 100%    Last Pain:  Vitals:   01/08/22 1600  TempSrc:   PainSc: [place]

## 2022-01-08 NOTE — ED Notes (Signed)
Pt made aware of need for urine specimen 

## 2022-01-08 NOTE — Anesthesia Procedure Notes (Signed)
Procedure Name: Intubation Date/Time: 01/08/2022 2:21 PM  Performed by: Cleda Daub, CRNAPre-anesthesia Checklist: Patient identified, Emergency Drugs available, Suction available and Patient being monitored Patient Re-evaluated:Patient Re-evaluated prior to induction Oxygen Delivery Method: Circle system utilized Preoxygenation: Pre-oxygenation with 100% oxygen Induction Type: IV induction Ventilation: Mask ventilation without difficulty Laryngoscope Size: Mac and 3 Grade View: Grade I Tube type: Oral Tube size: 7.0 mm Number of attempts: 1 Airway Equipment and Method: Stylet and Oral airway Placement Confirmation: ETT inserted through vocal cords under direct vision, positive ETCO2 and breath sounds checked- equal and bilateral Secured at: 20 cm Tube secured with: Tape Dental Injury: Teeth and Oropharynx as per pre-operative assessment

## 2022-01-08 NOTE — Op Note (Signed)
Preoperative diagnosis: small bowel obstruction Postoperative diagnosis: small bowel mass Procedure: exploratory laparotomy with small bowel resectoin Surgeon: Dr Serita Grammes Asst: Saverio Danker PA-C EBL minimal Anes: general Specimens small bowel to pathology Complications none Drains 19 Fr Blake Sponge and needle count correct Dispo recovery stable  Indications: This is an 72 yof who has ab pain and has a ct concerning for SBO and perforation. I discussed proceeding with laparotomy and likely small bowel resection.  Procedure: After informed consent was obtained patient was taken to the OR.  She had been given antibiotics.  SCDs were in place. She was placed under general anesthesia without complication. She was prepped and draped in standard sterile surgical fashion. Timeout was performed.  I made a midline incision and entered the peritoneal cavity. I explored the abdomen.  There was jejunal mass present. 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 I rotated this medially.  There was what appeared to be a chronic cavity associated with this.  I examined the left colon and this appeared separate and I elected not to resect this otherwise.  I then divided the small bowel proximal and distal to the mass, diverticulum and nodes. I used the ligasure and suture to divided the mesentery and then passed this off the table with stitch proximal.  I then closed the mesenteric defect with silk suture.  I placed 3-0 silks to bring the small bowel limbs into apposition. I made an enterotomy in both and then placed the stapler. I created an anastomosis. This was nonbleeding and patent.  I closed the common enterotomy with a TX stapler.  This was viable. I then placed this back in the abdomen. The remainder of the small bowel, large bowel, liver and stomach appeared normal.  There was no peritoneal disease.  I elected to place a 19 Fr Blake in this chronic  cavity. I then closed the abdomen with #1 looped PDS and staples to close. She was transferred to recovery stable.

## 2022-01-08 NOTE — Transfer of Care (Signed)
Immediate Anesthesia Transfer of Care Note  Patient: Jasmine Cantu  Procedure(s) Performed: EXPLORATORY LAPAROTOMY, SMALL BOWEL RESECTION  Patient Location: PACU  Anesthesia Type:General  Level of Consciousness: awake, alert , oriented and patient cooperative  Airway & Oxygen Therapy: Patient Spontanous Breathing and Patient connected to face mask oxygen  Post-op Assessment: Report given to RN and Post -op Vital signs reviewed and stable  Post vital signs: Reviewed and stable  Last Vitals:  Vitals Value Taken Time  BP 151/65 01/08/22 1530  Temp    Pulse 64 01/08/22 1533  Resp 22 01/08/22 1533  SpO2 100 % 01/08/22 1533  Vitals shown include unvalidated device data.  Last Pain:  Vitals:   01/08/22 1230  TempSrc: Oral  PainSc: 2          Complications: No notable events documented.

## 2022-01-08 NOTE — ED Provider Notes (Signed)
[facility]ERGENCY DEPARTMENT Provider Note   CSN: [id number] Arrival date & time: 01/08/22  0909     History  Chief Complaint  Patient presents with   Melena   Abdominal Pain    Jasmine Cantu is a 81 y.o. female.  Presenting for abdominal pain, dark stools.  Patient few days ago noted some constipation.  Over the weekend she noted some dark stools, the first occurred on Saturday, dark in appearance.  Sunday had another episode.  Somewhat loose but not watery.  Pain has been intermittent, comes and goes, no pattern.  Not associated with eating.  Has had generally poor appetite.  Pain is generalized, worse in the lower part of abdomen.  No nausea or vomiting.  HPI     Home Medications Prior to Admission medications   Medication Sig Start Date End Date Taking? Authorizing Provider  benazepril (LOTENSIN) 20 MG tablet Take 20 mg by mouth daily.    [provider]  dextromethorphan-guaiFENesin (MUCINEX DM) 30-600 MG 12hr tablet Take 1 tablet by mouth 2 (two) times daily. 10/27/20   Wieters, Hallie C, PA-C  loratadine (CLARITIN) 10 MG tablet Take 1 tablet (10 mg total) by mouth daily. 10/27/20   Wieters, Hallie C, PA-C  Olopatadine HCl 0.2 % SOLN Apply 1 drop to eye daily. 11/19/19   Tasia Catchings, Amy V, PA-C  promethazine-dextromethorphan (PROMETHAZINE-DM) 6.25-15 MG/5ML syrup Take 5 mLs by mouth 3 (three) times daily as needed for cough. 02/18/21   Scot Jun, FNP  ipratropium (ATROVENT) 0.06 % nasal spray Place 2 sprays into both nostrils 4 (four) times daily. 08/05/19 11/19/19  Ok Edwards, PA-C      Allergies    Doxycycline, Codeine, and Hydrocodone    Review of Systems   Review of Systems  Constitutional:  Negative for chills and fever.  HENT:  Negative for ear pain and sore throat.   Eyes:  Negative for pain and visual disturbance.  Respiratory:  Negative for cough and shortness of breath.   Cardiovascular:  Negative for chest pain and palpitations.   Gastrointestinal:  Positive for abdominal pain, blood in stool and constipation. Negative for vomiting.  Genitourinary:  Negative for dysuria and hematuria.  Musculoskeletal:  Negative for arthralgias and back pain.  Skin:  Negative for color change and rash.  Neurological:  Negative for seizures and syncope.  All other systems reviewed and are negative.   Physical Exam Updated Vital Signs BP (!) 109/48   Pulse 60   Temp 98.9 F (37.2 C) (Oral)   Resp 14   Ht '5\' 3"'$  (1.6 m)   Wt 68 kg   SpO2 98%   BMI 26.57 kg/m  Physical Exam Vitals and nursing note reviewed.  Constitutional:      General: She is not in acute distress.    Appearance: She is well-developed.  HENT:     Head: Normocephalic and atraumatic.  Eyes:     Conjunctiva/sclera: Conjunctivae normal.  Cardiovascular:     Rate and Rhythm: Normal rate and regular rhythm.     Heart sounds: No murmur heard. Pulmonary:     Effort: Pulmonary effort is normal. No respiratory distress.     Breath sounds: Normal breath sounds.  Abdominal:     Palpations: Abdomen is soft.     Tenderness: There is abdominal tenderness in the left lower quadrant. There is no guarding or rebound.  Genitourinary:    Comments: Rectum normal, stool brown, Margaret RN chaperone Musculoskeletal:  General: No swelling.     Cervical back: Neck supple.  Skin:    General: Skin is warm and dry.     Capillary Refill: Capillary refill takes less than 2 seconds.  Neurological:     Mental Status: She is alert.  Psychiatric:        Mood and Affect: Mood normal.     ED Results / Procedures / Treatments   Labs (all labs ordered are listed, but only abnormal results are displayed) Labs Reviewed  CBC WITH DIFFERENTIAL/PLATELET - Abnormal; Notable for the following components:      Result Value   WBC 10.9 (*)    Hemoglobin 10.9 (*)    HCT 33.1 (*)    Neutro Abs 9.3 (*)    All other components within normal limits  COMPREHENSIVE METABOLIC  PANEL - Abnormal; Notable for the following components:   Sodium 132 (*)    Glucose, Bld 118 (*)    Creatinine, Ser 1.10 (*)    AST 13 (*)    GFR, Estimated 50 (*)    All other components within normal limits  URINALYSIS, ROUTINE W REFLEX MICROSCOPIC - Abnormal; Notable for the following components:   Hgb urine dipstick TRACE (*)    Leukocytes,Ua MODERATE (*)    All other components within normal limits  URINALYSIS, MICROSCOPIC (REFLEX) - Abnormal; Notable for the following components:   Bacteria, UA MANY (*)    All other components within normal limits  LIPASE, BLOOD  OCCULT BLOOD X 1 CARD TO LAB, STOOL  LACTIC ACID, PLASMA  LACTIC ACID, PLASMA    EKG EKG Interpretation  Date/Time:  Monday January 08 2022 09:57:44 EDT Ventricular Rate:  59 PR Interval:  164 QRS Duration: 90 QT Interval:  419 QTC Calculation: 415 R Axis:   21 Text Interpretation: Sinus rhythm Confirmed by Madalyn Rob [phone]) on 01/08/2022 10:18:10 AM  Radiology CT Angio Abd/Pel W and/or Wo Contrast  Result Date: 01/08/2022 CLINICAL DATA:  81 year old female with GI bleeding EXAM: CTA ABDOMEN AND PELVIS WITHOUT AND WITH CONTRAST TECHNIQUE: Multidetector CT imaging of the abdomen and pelvis was performed using the standard protocol during bolus administration of intravenous contrast. Multiplanar reconstructed images and MIPs were obtained and reviewed to evaluate the vascular anatomy. RADIATION DOSE REDUCTION: This exam was performed according to the departmental dose-optimization program which includes automated exposure control, adjustment of the mA and/or kV according to patient size and/or use of iterative reconstruction technique. CONTRAST:  148m OMNIPAQUE IOHEXOL 350 MG/ML SOLN COMPARISON:  No comparison FINDINGS: VASCULAR Aorta: Unremarkable course, caliber, contour of the abdominal aorta. No dissection, aneurysm, or periaortic fluid. Mild atherosclerotic changes of the lower thoracic and the abdominal  aorta. Celiac: Mild atherosclerotic changes without high-grade stenosis or occlusion. Branches are patent. SMA: Mild atherosclerotic changes without high-grade stenosis or occlusion. Branches are patent. Renals: - Right: Single right renal artery. Atherosclerotic changes at the origin without high-grade stenosis. - Left: 3 left renal arteries of small caliber without significant atherosclerotic changes at the origin. IMA: Inferior mesenteric artery is patent. Right lower extremity: Unremarkable course, caliber, and contour of the right iliac system. No aneurysm, dissection, or occlusion. Hypogastric artery is patent. Common femoral artery patent. Proximal SFA and profunda femoris patent. Left lower extremity: Unremarkable course, caliber, and contour of the left iliac system. No aneurysm, dissection, or occlusion. Hypogastric artery is patent. Common femoral artery patent. Proximal SFA and profunda femoris patent. Veins: Unremarkable appearance of the venous system. Review of the MIP images confirms  the above findings. NON-VASCULAR Lower chest: No acute finding of the lower chest. Hepatobiliary: Unremarkable appearance of the liver. Cholecystectomy Pancreas: Unremarkable. Spleen: Unremarkable. Adrenals/Urinary Tract: - Right adrenal gland: Unremarkable - Left adrenal gland: Small nodule on the medial limb of the left adrenal gland, less than 10 mm, indeterminate on the current CT protocol. Most likely benign, with no further imaging warranted. - Right kidney: No hydronephrosis, nephrolithiasis, inflammation, or ureteral dilation. No focal lesion. - Left Kidney: No hydronephrosis, nephrolithiasis, inflammation, or ureteral dilation. No focal lesion. - Urinary Bladder: Unremarkable. Stomach/Bowel: - Stomach: Hiatal hernia with essentially intrathoracic stomach. No comparison. Small rounded nodule within the adjacent fat measuring 13 mm. This nodule has enhancement pattern that follows the spleen parenchyma. - Small  bowel: Dilated proximal small bowel loops. Abnormal small bowel within the left abdomen with circumferential wall thickening within a short segment loop without enhancement/hypoenhancement. Significant edema within the adjacent fat. There are a few small foci of extraluminal gas in air within the mesenteric loops indicating perforation. Small lymph nodes within the mesentery. - Appendix: Appendix is not visualized, however, no inflammatory changes are present adjacent to the cecum to indicate an appendicitis. - Colon: Decompressed, with fluid-filled right-sided colon. 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 Lymphatic: Small lymph nodes within the small bowel mesentery. Mesenteric: Reactive free fluid within the pelvis. Reproductive: Hysterectomy Other: No hernia. Musculoskeletal: No displaced fracture. IMPRESSION: Short segment of ischemic small bowel/jejunum in the left abdomen with perforation and small multifocal fluid and gas collections in the mesentery, potentially secondary to a closed loop. Emergency surgical consultation is indicated. Developing small bowel obstruction secondary to the site of ischemic small bowel. These above preliminary results were discussed by telephone at the time of interpretation on 01/08/2022 at 11:59 am with Dr. Madalyn Rob. Reactive free fluid layering within the anatomic pelvis. Atherosclerosis of the abdominal aorta without significant mesenteric arterial disease or renal artery disease. Aortic Atherosclerosis (ICD10-I70.0). Rounded nodule within the fat adjacent to hiatal hernia. This is favored to represent a splenule rather than a lymph node. If confirmation is warranted, consider MRI spleen protocol with inclusion of this site on the forthcoming study. Additional ancillary findings as above. Signed, Dulcy Fanny. Nadene Rubins, RPVI [facility] Radiology Electronically Signed   By: Corrie Mckusick  D.O.   On: 01/08/2022 12:03    Procedures .Critical Care  Performed by: Lucrezia Starch, MD Authorized by: Lucrezia Starch, MD   Critical care provider statement:    Critical care time (minutes):  49   Critical care was necessary to treat or prevent imminent or life-threatening deterioration of the following conditions: bowel perforation.   Critical care was time spent personally by me on the following activities:  Development of treatment plan with patient or surrogate, discussions with consultants, evaluation of patient's response to treatment, examination of patient, ordering and review of laboratory studies, ordering and review of radiographic studies, ordering and performing treatments and interventions, pulse oximetry, re-evaluation of patient's condition and review of old charts     Medications Ordered in ED Medications  piperacillin-tazobactam (ZOSYN) IVPB 3.375 g (3.375 g Intravenous New Bag/Given 01/08/22 1223)  ondansetron (ZOFRAN) injection 4 mg (has no administration in time range)  morphine (PF) 2 MG/ML injection 1-4 mg (has no administration in time range)  fentaNYL (SUBLIMAZE) injection 25 mcg (25 mcg Intravenous Given 01/08/22 1046)  iohexol (OMNIPAQUE) 350 MG/ML injection 100 mL (100 mLs Intravenous Contrast Given 01/08/22  1109)  0.9 %  sodium chloride infusion ( Intravenous New Bag/Given 01/08/22 1221)    ED Course/ Medical Decision Making/ A&P Clinical Course as of 01/08/22 1229  Mon Jan 08, 2022  1206 Discussed case with radiology, consulted general surgery, updated family, started IV antibiotics [RD]  1206 Patient is well-appearing, pain is well controlled, vitals remained stable, daughter and son at bedside updated as well [RD]    Clinical Course User Index [RD] Lucrezia Starch, MD                           Medical Decision Making Amount and/or Complexity of Data Reviewed Labs: ordered. Radiology: ordered.  Risk Prescription drug  management. Decision regarding hospitalization.   81 year old lady presenting for abdominal pain and dark stools.  On physical exam she actually appears quite well and was in no distress, her stool was brown.  Her abdomen did have some generalized tenderness but no rebound or guarding.  Basic labs grossly stable, no leukocytosis, no electrolyte derangements.  CT scan obtained.  I independently reviewed and interpreted results.  I discussed the case in detail with Dr. Earleen Newport who read the CT.  Findings are concerning for Short segment of ischemic small bowel/jejunum in the left abdomen with perforation and small multifocal fluid and gas collections in the mesentery, potentially secondary to a closed loop.bowel ischemia with perforation.  Started Zosyn, consult to general surgery.  Discussed with Claiborne Billings PA with general surgery and Dr. Garnette Czech.  They will accept patient directly to the preop area at [facility] and take patient to surgery this afternoon.  I have discussed urgency of case with [facility] and sending truck for transport. Updated patient and children at bedside.         Final Clinical Impression(s) / ED Diagnoses Final diagnoses:  Small bowel ischemia ([place])  Bowel perforation ()    Rx / DC Orders ED Discharge Orders     None         Lucrezia Starch, MD 01/08/22 1229

## 2022-01-09 ENCOUNTER — Encounter (HOSPITAL_COMMUNITY): Payer: Self-pay | Admitting: General Surgery

## 2022-01-09 ENCOUNTER — Inpatient Hospital Stay (HOSPITAL_COMMUNITY): Payer: Medicare Other

## 2022-01-09 LAB — BASIC METABOLIC PANEL
Anion gap: 6 (ref 5–15)
BUN: 14 mg/dL (ref 8–23)
CO2: 22 mmol/L (ref 22–32)
Calcium: 7.6 mg/dL — ABNORMAL LOW (ref 8.9–10.3)
Chloride: 103 mmol/L (ref 98–111)
Creatinine, Ser: 0.87 mg/dL (ref 0.44–1.00)
GFR, Estimated: >60 mL/min (ref >60)
Glucose, Bld: 216 mg/dL — ABNORMAL HIGH (ref 70–99)
Potassium: 4.9 mmol/L (ref 3.5–5.1)
Sodium: 131 mmol/L — ABNORMAL LOW (ref 135–145)

## 2022-01-09 LAB — CBC
HCT: 32.7 % — ABNORMAL LOW (ref 36.0–46.0)
Hemoglobin: 10.7 g/dL — ABNORMAL LOW (ref 12.0–15.0)
MCH: 28.3 pg (ref 26.0–34.0)
MCHC: 32.7 g/dL (ref 30.0–36.0)
MCV: 86.5 fL (ref 80.0–100.0)
Platelets: 180 10*3/uL (ref 150–400)
RBC: 3.78 MIL/uL — ABNORMAL LOW (ref 3.87–5.11)
RDW: 13.3 % (ref 11.5–15.5)
WBC: 7.9 10*3/uL (ref 4.0–10.5)
nRBC: 0 % (ref 0.0–0.2)

## 2022-01-09 MED ORDER — PANTOPRAZOLE SODIUM 40 MG PO TBEC
40.0000 mg | DELAYED_RELEASE_TABLET | Freq: Every day | ORAL | Status: DC
Start: 2022-01-09 — End: 2022-01-10
  Administered 2022-01-09: 40 mg via ORAL
  Filled 2022-01-09: qty 1

## 2022-01-09 NOTE — Progress Notes (Signed)
Transition of Care [facility]) Screening Note  Patient Details  Name: Jasmine Cantu Date of Birth: November 07, 1940  Transition of Care [facility]) CM/SW Contact:    Sherie Don, LCSW Phone Number: 01/09/2022, 10:58 AM  Transition of Care Department [facility]) has reviewed patient and no TOC needs have been identified at this time. We will continue to monitor patient advancement through interdisciplinary progression rounds. If new patient transition needs arise, please place a TOC consult.

## 2022-01-09 NOTE — Progress Notes (Signed)
1 Day Post-Op  Subjective: Patient feels ok today.  Having some nausea and reflux.  Having pain with movement.  Foley out this am at 0600am.   ROS: See above, otherwise other systems negative  Objective: Vital signs in last 24 hours: Temp:  [97.5 F (36.4 C)-98.9 F (37.2 C)] 97.8 F (36.6 C) (06/20 0902) Pulse Rate:  [51-76] 51 (06/20 0902) Resp:  [11-18] 15 (06/20 0902) BP: (109-166)/(48-87) 130/51 (06/20 0902) SpO2:  [98 %-100 %] 100 % (06/20 0902) Weight:  [68 kg] 68 kg (06/19 0922) Last BM Date : 01/08/22  Intake/Output from previous day: 06/19 0701 - 06/20 0700 In: 2436.4 [I.V.:2226.1; IV Piggyback:210.3] Out: 1930 [Urine:1150; Drains:730; Blood:50] Intake/Output this shift: No intake/output data recorded.  PE: Gen: NAD Heart: regular Lungs: CTAB Abd: soft, appropriately tender, mild bloating, few BS, midline incision is c/d/I with staples and honeycomb, JP with serosang output  Lab Results:  Recent Labs    01/08/22 0952 01/09/22 0430  WBC 10.9* 7.9  HGB 10.9* 10.7*  HCT 33.1* 32.7*  PLT 173 180   BMET Recent Labs    01/08/22 0952 01/09/22 0430  NA 132* 131*  K 4.0 4.9  CL 99 103  CO2 25 22  GLUCOSE 118* 216*  BUN 18 14  CREATININE 1.10* 0.87  CALCIUM 8.9 7.6*   PT/INR No results for input(s): "LABPROT", "INR" in the last 72 hours. CMP     Component Value Date/Time   NA 131 (L) 01/09/2022 0430   K 4.9 01/09/2022 0430   CL 103 01/09/2022 0430   CO2 22 01/09/2022 0430   GLUCOSE 216 (H) 01/09/2022 0430   BUN 14 01/09/2022 0430   CREATININE 0.87 01/09/2022 0430   CALCIUM 7.6 (L) 01/09/2022 0430   PROT 6.7 01/08/2022 0952   ALBUMIN 3.6 01/08/2022 0952   AST 13 (L) 01/08/2022 0952   ALT 8 01/08/2022 0952   ALKPHOS 105 01/08/2022 0952   BILITOT 0.9 01/08/2022 0952   GFRNONAA >60 01/09/2022 0430   Lipase     Component Value Date/Time   LIPASE 28 01/08/2022 0952       Studies/Results: CT Angio Abd/Pel W and/or Wo  Contrast  Result Date: 01/08/2022 CLINICAL DATA:  81 year old female with GI bleeding EXAM: CTA ABDOMEN AND PELVIS WITHOUT AND WITH CONTRAST TECHNIQUE: Multidetector CT imaging of the abdomen and pelvis was performed using the standard protocol during bolus administration of intravenous contrast. Multiplanar reconstructed images and MIPs were obtained and reviewed to evaluate the vascular anatomy. RADIATION DOSE REDUCTION: This exam was performed according to the departmental dose-optimization program which includes automated exposure control, adjustment of the mA and/or kV according to patient size and/or use of iterative reconstruction technique. CONTRAST:  147m OMNIPAQUE IOHEXOL 350 MG/ML SOLN COMPARISON:  No comparison FINDINGS: VASCULAR Aorta: Unremarkable course, caliber, contour of the abdominal aorta. No dissection, aneurysm, or periaortic fluid. Mild atherosclerotic changes of the lower thoracic and the abdominal aorta. Celiac: Mild atherosclerotic changes without high-grade stenosis or occlusion. Branches are patent. SMA: Mild atherosclerotic changes without high-grade stenosis or occlusion. Branches are patent. Renals: - Right: Single right renal artery. Atherosclerotic changes at the origin without high-grade stenosis. - Left: 3 left renal arteries of small caliber without significant atherosclerotic changes at the origin. IMA: Inferior mesenteric artery is patent. Right lower extremity: Unremarkable course, caliber, and contour of the right iliac system. No aneurysm, dissection, or occlusion. Hypogastric artery is patent. Common femoral artery patent. Proximal SFA and profunda femoris patent. Left  lower extremity: Unremarkable course, caliber, and contour of the left iliac system. No aneurysm, dissection, or occlusion. Hypogastric artery is patent. Common femoral artery patent. Proximal SFA and profunda femoris patent. Veins: Unremarkable appearance of the venous system. Review of the MIP images  confirms the above findings. NON-VASCULAR Lower chest: No acute finding of the lower chest. Hepatobiliary: Unremarkable appearance of the liver. Cholecystectomy Pancreas: Unremarkable. Spleen: Unremarkable. Adrenals/Urinary Tract: - Right adrenal gland: Unremarkable - Left adrenal gland: Small nodule on the medial limb of the left adrenal gland, less than 10 mm, indeterminate on the current CT protocol. Most likely benign, with no further imaging warranted. - Right kidney: No hydronephrosis, nephrolithiasis, inflammation, or ureteral dilation. No focal lesion. - Left Kidney: No hydronephrosis, nephrolithiasis, inflammation, or ureteral dilation. No focal lesion. - Urinary Bladder: Unremarkable. Stomach/Bowel: - Stomach: Hiatal hernia with essentially intrathoracic stomach. No comparison. Small rounded nodule within the adjacent fat measuring 13 mm. This nodule has enhancement pattern that follows the spleen parenchyma. - Small bowel: Dilated proximal small bowel loops. Abnormal small bowel within the left abdomen with circumferential wall thickening within a short segment loop without enhancement/hypoenhancement. Significant edema within the adjacent fat. There are a few small foci of extraluminal gas in air within the mesenteric loops indicating perforation. Small lymph nodes within the mesentery. - Appendix: Appendix is not visualized, however, no inflammatory changes are present adjacent to the cecum to indicate an appendicitis. - Colon: Decompressed, with fluid-filled right-sided colon. 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 Lymphatic: Small lymph nodes within the small bowel mesentery. Mesenteric: Reactive free fluid within the pelvis. Reproductive: Hysterectomy Other: No hernia. Musculoskeletal: No displaced fracture. IMPRESSION: Short segment of ischemic small bowel/jejunum in the left abdomen with perforation and small multifocal fluid and gas collections in the mesentery,  potentially secondary to a closed loop. Emergency surgical consultation is indicated. Developing small bowel obstruction secondary to the site of ischemic small bowel. These above preliminary results were discussed by telephone at the time of interpretation on 01/08/2022 at 11:59 am with Dr. Madalyn Rob. Reactive free fluid layering within the anatomic pelvis. Atherosclerosis of the abdominal aorta without significant mesenteric arterial disease or renal artery disease. Aortic Atherosclerosis (ICD10-I70.0). Rounded nodule within the fat adjacent to hiatal hernia. This is favored to represent a splenule rather than a lymph node. If confirmation is warranted, consider MRI spleen protocol with inclusion of this site on the forthcoming study. Additional ancillary findings as above. Signed, Dulcy Fanny. Nadene Rubins, RPVI [facility] Radiology Electronically Signed   By: Corrie Mckusick D.O.   On: 01/08/2022 12:03    Anti-infectives: Anti-infectives (From admission, onward)    Start     Dose/Rate Route Frequency Ordered Stop   01/08/22 2000  piperacillin-tazobactam (ZOSYN) IVPB 3.375 g  Status:  Discontinued        3.375 g 12.5 mL/hr over 240 Minutes Intravenous Every 8 hours 01/08/22 1340 01/08/22 1608   01/08/22 2000  piperacillin-tazobactam (ZOSYN) IVPB 3.375 g        3.375 g 12.5 mL/hr over 240 Minutes Intravenous Every 8 hours 01/08/22 1608 01/13/22 2159   01/08/22 1400  piperacillin-tazobactam (ZOSYN) IVPB 3.375 g  Status:  Discontinued        3.375 g 100 mL/hr over 30 Minutes Intravenous Every 8 hours 01/08/22 1201 01/08/22 1339        Assessment/Plan POD 1, s/p ex lap with SBR for SB mass/inflammatory mass, Dr. Donne Hazel 6/19 -add  protonix for reflux -remain on sips of clears til nausea improves -mobilize/pulm toilet -cont drain -await pathology -mulit-modal pain control  -cont zosyn x5 days  FEN - sips of clears/IVFs VTE -  lovenox ID - zosyn x 5 days  HTN - on home meds   LOS: 1 day    Henreitta Cea , [facility] Surgery 01/09/2022, 9:19 AM Please see Amion for pager number during day hours 7:00am-4:30pm or 7:00am -11:30am on weekends

## 2022-01-10 ENCOUNTER — Inpatient Hospital Stay (HOSPITAL_COMMUNITY): Payer: Medicare Other

## 2022-01-10 LAB — SURGICAL PATHOLOGY

## 2022-01-10 MED ORDER — HYDRALAZINE HCL 20 MG/ML IJ SOLN
5.0000 mg | Freq: Four times a day (QID) | INTRAMUSCULAR | Status: DC
  Administered 2022-01-10 – 2022-01-15 (×16): 5 mg via INTRAVENOUS
  Filled 2022-01-10 (×18): qty 1

## 2022-01-10 MED ORDER — ACETAMINOPHEN 10 MG/ML IV SOLN
1000.0000 mg | Freq: Four times a day (QID) | INTRAVENOUS | Status: AC
  Administered 2022-01-10 – 2022-01-11 (×4): 1000 mg via INTRAVENOUS
  Filled 2022-01-10 (×4): qty 100

## 2022-01-10 MED ORDER — PANTOPRAZOLE SODIUM 40 MG IV SOLR
40.0000 mg | INTRAVENOUS | Status: DC
  Administered 2022-01-10 – 2022-01-14 (×5): 40 mg via INTRAVENOUS
  Filled 2022-01-10 (×5): qty 10

## 2022-01-10 MED ORDER — MENTHOL 3 MG MT LOZG
1.0000 | LOZENGE | OROMUCOSAL | Status: DC | PRN
  Filled 2022-01-10 (×2): qty 9

## 2022-01-10 MED ORDER — PHENOL 1.4 % MT LIQD
1.0000 | OROMUCOSAL | Status: DC | PRN
  Administered 2022-01-10: 1 via OROMUCOSAL
  Filled 2022-01-10: qty 177

## 2022-01-10 NOTE — Progress Notes (Signed)
2 Days Post-Op  Subjective: Still with pain and still having nausea and vomiting.  Dark looking liquid every time she moves.  voiding well   Objective: Vital signs in last 24 hours: Temp:  [97.7 F (36.5 C)-98.2 F (36.8 C)] 98 F (36.7 C) (06/21 0547) Pulse Rate:  [51-63] 63 (06/21 0547) Resp:  [16-17] 17 (06/21 0547) BP: (137-153)/(51-65) 153/65 (06/21 0547) SpO2:  [97 %-98 %] 97 % (06/21 0547) Last BM Date : 01/08/22  Intake/Output from previous day: 06/20 0701 - 06/21 0700 In: 2194.5 [P.O.:90; I.V.:1963.7; IV Piggyback:140.8] Out: 1200 [Urine:950; Drains:250] Intake/Output this shift: Total I/O In: 194.9 [I.V.:167.5; IV Piggyback:27.4] Out: -   PE: Gen: NAD Heart: regular Lungs: CTAB Abd: soft, appropriately tender, mild bloating, hypoactive BS, midline incision is c/d/I with staples and honeycomb, JP with serosang output. 250cc yesterday.  Lab Results:  Recent Labs    01/08/22 0952 01/09/22 0430  WBC 10.9* 7.9  HGB 10.9* 10.7*  HCT 33.1* 32.7*  PLT 173 180   BMET Recent Labs    01/08/22 0952 01/09/22 0430  NA 132* 131*  K 4.0 4.9  CL 99 103  CO2 25 22  GLUCOSE 118* 216*  BUN 18 14  CREATININE 1.10* 0.87  CALCIUM 8.9 7.6*   PT/INR No results for input(s): "LABPROT", "INR" in the last 72 hours. CMP     Component Value Date/Time   NA 131 (L) 01/09/2022 0430   K 4.9 01/09/2022 0430   CL 103 01/09/2022 0430   CO2 22 01/09/2022 0430   GLUCOSE 216 (H) 01/09/2022 0430   BUN 14 01/09/2022 0430   CREATININE 0.87 01/09/2022 0430   CALCIUM 7.6 (L) 01/09/2022 0430   PROT 6.7 01/08/2022 0952   ALBUMIN 3.6 01/08/2022 0952   AST 13 (L) 01/08/2022 0952   ALT 8 01/08/2022 0952   ALKPHOS 105 01/08/2022 0952   BILITOT 0.9 01/08/2022 0952   GFRNONAA >60 01/09/2022 0430   Lipase     Component Value Date/Time   LIPASE 28 01/08/2022 0952       Studies/Results: DG Abd Portable 1V  Result Date: 01/09/2022 CLINICAL DATA:  6428. 81 year old  female with status post laparotomy and small-bowel resection on 01/08/2022. Patient with nausea and vomiting. EXAM: PORTABLE ABDOMEN - 1 VIEW, 2 images COMPARISON:  Correlation is made with a CT of the abdomen pelvis from yesterday FINDINGS: The bowel gas pattern is normal. No radio-opaque calculi or other significant radiographic abnormality are seen. There are post laparotomy changes with skin staples seen in the right paramedian aspect. Drainage catheter at the left side of the abdomen. Mild gaseous distention of the small and large bowel loops and is nonobstructive. No radiopaque calculus seen. IMPRESSION: There are post laparotomy changes seen. Bowel-gas pattern is nonspecific and nonobstructive. Electronically Signed   By: Frazier Richards M.D.   On: 01/09/2022 11:40   CT Angio Abd/Pel W and/or Wo Contrast  Result Date: 01/08/2022 CLINICAL DATA:  81 year old female with GI bleeding EXAM: CTA ABDOMEN AND PELVIS WITHOUT AND WITH CONTRAST TECHNIQUE: Multidetector CT imaging of the abdomen and pelvis was performed using the standard protocol during bolus administration of intravenous contrast. Multiplanar reconstructed images and MIPs were obtained and reviewed to evaluate the vascular anatomy. RADIATION DOSE REDUCTION: This exam was performed according to the departmental dose-optimization program which includes automated exposure control, adjustment of the mA and/or kV according to patient size and/or use of iterative reconstruction technique. CONTRAST:  177m OMNIPAQUE IOHEXOL 350 MG/ML SOLN  COMPARISON:  No comparison FINDINGS: VASCULAR Aorta: Unremarkable course, caliber, contour of the abdominal aorta. No dissection, aneurysm, or periaortic fluid. Mild atherosclerotic changes of the lower thoracic and the abdominal aorta. Celiac: Mild atherosclerotic changes without high-grade stenosis or occlusion. Branches are patent. SMA: Mild atherosclerotic changes without high-grade stenosis or occlusion. Branches are  patent. Renals: - Right: Single right renal artery. Atherosclerotic changes at the origin without high-grade stenosis. - Left: 3 left renal arteries of small caliber without significant atherosclerotic changes at the origin. IMA: Inferior mesenteric artery is patent. Right lower extremity: Unremarkable course, caliber, and contour of the right iliac system. No aneurysm, dissection, or occlusion. Hypogastric artery is patent. Common femoral artery patent. Proximal SFA and profunda femoris patent. Left lower extremity: Unremarkable course, caliber, and contour of the left iliac system. No aneurysm, dissection, or occlusion. Hypogastric artery is patent. Common femoral artery patent. Proximal SFA and profunda femoris patent. Veins: Unremarkable appearance of the venous system. Review of the MIP images confirms the above findings. NON-VASCULAR Lower chest: No acute finding of the lower chest. Hepatobiliary: Unremarkable appearance of the liver. Cholecystectomy Pancreas: Unremarkable. Spleen: Unremarkable. Adrenals/Urinary Tract: - Right adrenal gland: Unremarkable - Left adrenal gland: Small nodule on the medial limb of the left adrenal gland, less than 10 mm, indeterminate on the current CT protocol. Most likely benign, with no further imaging warranted. - Right kidney: No hydronephrosis, nephrolithiasis, inflammation, or ureteral dilation. No focal lesion. - Left Kidney: No hydronephrosis, nephrolithiasis, inflammation, or ureteral dilation. No focal lesion. - Urinary Bladder: Unremarkable. Stomach/Bowel: - Stomach: Hiatal hernia with essentially intrathoracic stomach. No comparison. Small rounded nodule within the adjacent fat measuring 13 mm. This nodule has enhancement pattern that follows the spleen parenchyma. - Small bowel: Dilated proximal small bowel loops. Abnormal small bowel within the left abdomen with circumferential wall thickening within a short segment loop without enhancement/hypoenhancement.  Significant edema within the adjacent fat. There are a few small foci of extraluminal gas in air within the mesenteric loops indicating perforation. Small lymph nodes within the mesentery. - Appendix: Appendix is not visualized, however, no inflammatory changes are present adjacent to the cecum to indicate an appendicitis. - Colon: Decompressed, with fluid-filled right-sided colon. 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 Lymphatic: Small lymph nodes within the small bowel mesentery. Mesenteric: Reactive free fluid within the pelvis. Reproductive: Hysterectomy Other: No hernia. Musculoskeletal: No displaced fracture. IMPRESSION: Short segment of ischemic small bowel/jejunum in the left abdomen with perforation and small multifocal fluid and gas collections in the mesentery, potentially secondary to a closed loop. Emergency surgical consultation is indicated. Developing small bowel obstruction secondary to the site of ischemic small bowel. These above preliminary results were discussed by telephone at the time of interpretation on 01/08/2022 at 11:59 am with Dr. Madalyn Rob. Reactive free fluid layering within the anatomic pelvis. Atherosclerosis of the abdominal aorta without significant mesenteric arterial disease or renal artery disease. Aortic Atherosclerosis (ICD10-I70.0). Rounded nodule within the fat adjacent to hiatal hernia. This is favored to represent a splenule rather than a lymph node. If confirmation is warranted, consider MRI spleen protocol with inclusion of this site on the forthcoming study. Additional ancillary findings as above. Signed, Dulcy Fanny. Nadene Rubins, RPVI [facility] Radiology Electronically Signed   By: Corrie Mckusick D.O.   On: 01/08/2022 12:03    Anti-infectives: Anti-infectives (From admission, onward)    Start     Dose/Rate Route Frequency Ordered Stop  01/08/22 2000  piperacillin-tazobactam  (ZOSYN) IVPB 3.375 g  Status:  Discontinued        3.375 g 12.5 mL/hr over 240 Minutes Intravenous Every 8 hours 01/08/22 1340 01/08/22 1608   01/08/22 2000  piperacillin-tazobactam (ZOSYN) IVPB 3.375 g        3.375 g 12.5 mL/hr over 240 Minutes Intravenous Every 8 hours 01/08/22 1608 01/13/22 2159   01/08/22 1400  piperacillin-tazobactam (ZOSYN) IVPB 3.375 g  Status:  Discontinued        3.375 g 100 mL/hr over 30 Minutes Intravenous Every 8 hours 01/08/22 1201 01/08/22 1339        Assessment/Plan POD 2, s/p ex lap with SBR for SB mass/inflammatory mass, Dr. Donne Hazel 6/19 -post op ileus despite normal films yesterday.  Conts to vomit.  NGT to LIWS today and await bowel function. -mobilize/pulm toilet -cont drain -await pathology -mulit-modal pain control  -cont zosyn x5 days  FEN - NPO/NGT/IVFs VTE - lovenox ID - zosyn x 5 days  HTN - hold home meds and schedule '5mg'$  of hydralazine QID   LOS: 2 days    Henreitta Cea , [facility] 01/10/2022, 9:25 AM Please see Amion for pager number during day hours 7:00am-4:30pm or 7:00am -11:30am on weekends

## 2022-01-11 LAB — BASIC METABOLIC PANEL
Anion gap: 6 (ref 5–15)
BUN: 16 mg/dL (ref 8–23)
CO2: 22 mmol/L (ref 22–32)
Calcium: 8 mg/dL — ABNORMAL LOW (ref 8.9–10.3)
Chloride: 101 mmol/L (ref 98–111)
Creatinine, Ser: 0.85 mg/dL (ref 0.44–1.00)
GFR, Estimated: >60 mL/min (ref >60)
Glucose, Bld: 128 mg/dL — ABNORMAL HIGH (ref 70–99)
Potassium: 4.3 mmol/L (ref 3.5–5.1)
Sodium: 129 mmol/L — ABNORMAL LOW (ref 135–145)

## 2022-01-11 NOTE — Progress Notes (Signed)
3 Days Post-Op  Subjective: Just doesn't feel great overall.  Tube hurts her throat.  Abdominal pain is mild and well controlled.  Ambulating some.  NGT with 1100cc out  yesterday.   Objective: Vital signs in last 24 hours: Temp:  [98 F (36.7 C)-98.4 F (36.9 C)] 98 F (36.7 C) (06/22 0526) Pulse Rate:  [66-69] 69 (06/22 0526) Resp:  [16] 16 (06/22 0526) BP: (114-138)/(47-58) 138/58 (06/22 0526) SpO2:  [97 %-100 %] 97 % (06/22 0526) Last BM Date : 01/08/22  Intake/Output from previous day: 06/21 0701 - 06/22 0700 In: 1768 [I.V.:1260.9; IV Piggyback:507.1] Out: 1845 [Urine:300; Emesis/NG output:1100; Drains:445] Intake/Output this shift: No intake/output data recorded.  PE: Gen: NAD Abd: soft, appropriately tender, mild bloating, but less today than yesterday,  midline incision is c/d/I with staples and honeycomb, JP with serosang output. 445cc yesterday., NGT with bilious output, 1100cc  Lab Results:  Recent Labs    01/08/22 0952 01/09/22 0430  WBC 10.9* 7.9  HGB 10.9* 10.7*  HCT 33.1* 32.7*  PLT 173 180   BMET Recent Labs    01/08/22 0952 01/09/22 0430  NA 132* 131*  K 4.0 4.9  CL 99 103  CO2 25 22  GLUCOSE 118* 216*  BUN 18 14  CREATININE 1.10* 0.87  CALCIUM 8.9 7.6*   PT/INR No results for input(s): "LABPROT", "INR" in the last 72 hours. CMP     Component Value Date/Time   NA 131 (L) 01/09/2022 0430   K 4.9 01/09/2022 0430   CL 103 01/09/2022 0430   CO2 22 01/09/2022 0430   GLUCOSE 216 (H) 01/09/2022 0430   BUN 14 01/09/2022 0430   CREATININE 0.87 01/09/2022 0430   CALCIUM 7.6 (L) 01/09/2022 0430   PROT 6.7 01/08/2022 0952   ALBUMIN 3.6 01/08/2022 0952   AST 13 (L) 01/08/2022 0952   ALT 8 01/08/2022 0952   ALKPHOS 105 01/08/2022 0952   BILITOT 0.9 01/08/2022 0952   GFRNONAA >60 01/09/2022 0430   Lipase     Component Value Date/Time   LIPASE 28 01/08/2022 0952       Studies/Results: DG Abd 1 View  Result Date:  01/10/2022 CLINICAL DATA:  NG tube placement EXAM: ABDOMEN - 1 VIEW COMPARISON:  01/09/2022 FINDINGS: Esophagogastric tube is positioned coiled within a hiatal hernia, tip and side port above the diaphragm. Nonobstructive pattern of included bowel gas. Midline laparotomy incision. Surgical drain projects over the left hemiabdomen. IMPRESSION: Esophagogastric tube is positioned coiled within a hiatal hernia, tip and side port above the diaphragm. Recommend repositioning and advancement to ensure subdiaphragmatic positioning. Electronically Signed   By: Jearld Lesch M.D.   On: 01/10/2022 11:40   DG Abd Portable 1V  Result Date: 01/09/2022 CLINICAL DATA:  6151. 81 year old female with status post laparotomy and small-bowel resection on 01/08/2022. Patient with nausea and vomiting. EXAM: PORTABLE ABDOMEN - 1 VIEW, 2 images COMPARISON:  Correlation is made with a CT of the abdomen pelvis from yesterday FINDINGS: The bowel gas pattern is normal. No radio-opaque calculi or other significant radiographic abnormality are seen. There are post laparotomy changes with skin staples seen in the right paramedian aspect. Drainage catheter at the left side of the abdomen. Mild gaseous distention of the small and large bowel loops and is nonobstructive. No radiopaque calculus seen. IMPRESSION: There are post laparotomy changes seen. Bowel-gas pattern is nonspecific and nonobstructive. Electronically Signed   By: Marjo Bicker M.D.   On: 01/09/2022 11:40  Anti-infectives: Anti-infectives (From admission, onward)    Start     Dose/Rate Route Frequency Ordered Stop   01/08/22 2000  piperacillin-tazobactam (ZOSYN) IVPB 3.375 g  Status:  Discontinued        3.375 g 12.5 mL/hr over 240 Minutes Intravenous Every 8 hours 01/08/22 1340 01/08/22 1608   01/08/22 2000  piperacillin-tazobactam (ZOSYN) IVPB 3.375 g        3.375 g 12.5 mL/hr over 240 Minutes Intravenous Every 8 hours 01/08/22 1608 01/13/22 2159   01/08/22 1400   piperacillin-tazobactam (ZOSYN) IVPB 3.375 g  Status:  Discontinued        3.375 g 100 mL/hr over 30 Minutes Intravenous Every 8 hours 01/08/22 1201 01/08/22 1339        Assessment/Plan POD 3, s/p ex lap with SBR for SB mass/inflammatory mass, Dr. Dwain Sarna 6/19 -post op ileus, cont NGT to LIWS and await bowel function -mobilize/pulm toilet -cont drain -pathology benign with small bowel diverticulitis -mulit-modal pain control  -cont zosyn x5 days -recheck BMET today with NGT in place to follow cr and electrolytes  FEN - NPO/NGT/IVFs VTE - lovenox ID - zosyn x 5 days  HTN - hold home meds and schedule 5mg  of hydralazine QID, BP well controlled today   LOS: 3 days    Letha Cape , [facility] Surgery 01/11/2022, 9:51 AM Please see Amion for pager number during day hours 7:00am-4:30pm or 7:00am -11:30am on weekends

## 2022-01-11 NOTE — TOC Initial Note (Signed)
Transition of Care [facility]) - Initial/Assessment Note   Patient Details  Name: Jasmine Cantu MRN: [id number] Date of Birth: 1941/06/01  Transition of Care [facility]) CM/SW Contact:    Sherie Don, LCSW Phone Number: 01/11/2022, 1:53 PM  Clinical Narrative: CSW notified patient will need a rolling walker prior to discharge. CSW spoke with patient's daughter to confirm patient will need the walker and has not received one through insurance in the past 5 years. Daughter agreeable to referral to [employer]. [employer] to deliver rolling walker to patient's room.  Expected Discharge Plan: Home/Self Care Barriers to Discharge: Continued Medical Work up  Patient Goals and CMS Choice CMS Medicare.gov Compare Post Acute Care list provided to:: Patient Represenative (must comment) Choice offered to / list presented to : Adult Children  Expected Discharge Plan and Services Expected Discharge Plan: Home/Self Care In-house Referral: Clinical Social Work Discharge Planning Services: NA Post Acute Care Choice: Durable Medical Equipment Living arrangements for the past 2 months: Single Family Home          DME Arranged: Walker rolling DME Agency: [employer] Date DME Agency Contacted: 01/11/22 Time DME Agency Contacted: [id number] Representative spoke with at DME Agency: Andee Poles  Prior Living Arrangements/Services Living arrangements for the past 2 months: [place] Patient language and need for interpreter reviewed:: Yes Do you feel safe going back to the place where you live?: Yes      Need for Family Participation in Patient Care: Yes (Comment) Care giver support system in place?: Yes (comment) Criminal Activity/Legal Involvement Pertinent to Current Situation/Hospitalization: No - Comment as needed  Activities of Daily Living Home Assistive Devices/Equipment: None ADL Screening (condition at time of admission) Patient's cognitive ability adequate to safely complete daily activities?: Yes Is the patient  deaf or have difficulty hearing?: No Does the patient have difficulty seeing, even when wearing glasses/contacts?: No Does the patient have difficulty concentrating, remembering, or making decisions?: No Patient able to express need for assistance with ADLs?: Yes Does the patient have difficulty dressing or bathing?: No Independently performs ADLs?: Yes (appropriate for developmental age) Does the patient have difficulty walking or climbing stairs?: No Weakness of Legs: None Weakness of Arms/Hands: None  Permission Sought/Granted Permission sought to share information with : Other (comment) Permission granted to share information with : Yes, Verbal Permission Granted Permission granted to share info w AGENCY: [employer]  Emotional Assessment Orientation: : Oriented to Self, Oriented to Place, Oriented to  Time, Oriented to Situation Alcohol / Substance Use: Not Applicable Psych Involvement: No (comment)  Admission diagnosis:  Bowel perforation ([place]) [K63.1] Small bowel ischemia ([place]) [K55.9] Perforated bowel ([place]) [K63.1] Patient Active Problem List   Diagnosis Date Noted   Small bowel ischemia ([place]) 01/08/2022   Perforated bowel ([place]) 01/08/2022   Reflux 05/31/2012   PCP:  Harlan Stains, MD Pharmacy:   [id number] [place] [place] [address]Phone: [phone]: [phone] [address] [id number]: [phone]: [phone] Readmission Risk Interventions     No data to display

## 2022-01-12 LAB — BASIC METABOLIC PANEL
Anion gap: 6 (ref 5–15)
BUN: 12 mg/dL (ref 8–23)
CO2: 25 mmol/L (ref 22–32)
Calcium: 8.2 mg/dL — ABNORMAL LOW (ref 8.9–10.3)
Chloride: 106 mmol/L (ref 98–111)
Creatinine, Ser: 1.02 mg/dL — ABNORMAL HIGH (ref 0.44–1.00)
GFR, Estimated: 55 mL/min — ABNORMAL LOW (ref >60)
Glucose, Bld: 115 mg/dL — ABNORMAL HIGH (ref 70–99)
Potassium: 4.4 mmol/L (ref 3.5–5.1)
Sodium: 137 mmol/L (ref 135–145)

## 2022-01-12 MED ORDER — SODIUM CHLORIDE 0.9 % IV SOLN: INTRAVENOUS | Status: DC

## 2022-01-13 LAB — BASIC METABOLIC PANEL
Anion gap: 7 (ref 5–15)
BUN: 13 mg/dL (ref 8–23)
CO2: 22 mmol/L (ref 22–32)
Calcium: 7.8 mg/dL — ABNORMAL LOW (ref 8.9–10.3)
Chloride: 108 mmol/L (ref 98–111)
Creatinine, Ser: 1.01 mg/dL — ABNORMAL HIGH (ref 0.44–1.00)
GFR, Estimated: 55 mL/min — ABNORMAL LOW (ref >60)
Glucose, Bld: 103 mg/dL — ABNORMAL HIGH (ref 70–99)
Potassium: 3.8 mmol/L (ref 3.5–5.1)
Sodium: 137 mmol/L (ref 135–145)

## 2022-01-13 NOTE — Progress Notes (Signed)
Dressing to drain removal site saturated with serous drainage after patient reports getting OOB. New gauze reapplied, will notify Tammy Sours, primary RN.

## 2022-01-13 NOTE — Plan of Care (Signed)
Uneventful day. NGT, JP drains removed. JP dressing saturated, replaced x 1. Endorses mild residual resolving throat discomfort s/p NGT. Ambulated halls with standby assist. IS and deep breathing encouraged. Moderate loose green BM x 1. Passing flatus. Family at the bedside all shift.   Problem: Education: Goal: Knowledge of General Education information will improve Description: Including pain rating scale, medication(s)/side effects and non-pharmacologic comfort measures Outcome: Progressing   Problem: Health Behavior/Discharge Planning: Goal: Ability to manage health-related needs will improve Outcome: Progressing   Problem: Clinical Measurements: Goal: Ability to maintain clinical measurements within normal limits will improve Outcome: Progressing Goal: Will remain free from infection Outcome: Progressing Goal: Diagnostic test results will improve Outcome: Progressing Goal: Respiratory complications will improve Outcome: Progressing Goal: Cardiovascular complication will be avoided Outcome: Progressing

## 2022-01-14 MED ORDER — MORPHINE SULFATE (PF) 2 MG/ML IV SOLN: 1.0000 mg | INTRAVENOUS | Status: DC | PRN

## 2022-01-14 NOTE — Progress Notes (Signed)
6 Days Post-Op   Subjective/Chief Complaint: Doing great, having flatus/bm, tol clears   Objective: Vital signs in last 24 hours: Temp:  [97.8 F (36.6 C)-98.4 F (36.9 C)] 97.8 F (36.6 C) (06/25 0530) Pulse Rate:  [70-75] 73 (06/25 0530) Resp:  [14-18] 16 (06/25 0530) BP: (130-147)/(48-58) 135/50 (06/25 0530) SpO2:  [96 %-99 %] 99 % (06/25 0530) Last BM Date : 01/13/22  Intake/Output from previous day: 06/24 0701 - 06/25 0700 In: 2014.7 [P.O.:540; I.V.:1474.7] Out: 900 [Urine:150; Emesis/NG output:750] Intake/Output this shift: No intake/output data recorded.  Ab soft approp tender incision clean drain site fine  Lab Results:  No results for input(s): "WBC", "HGB", "HCT", "PLT" in the last 72 hours. BMET Recent Labs    01/12/22 0656 01/13/22 0509  NA 137 137  K 4.4 3.8  CL 106 108  CO2 25 22  GLUCOSE 115* 103*  BUN 12 13  CREATININE 1.02* 1.01*  CALCIUM 8.2* 7.8*   PT/INR No results for input(s): "LABPROT", "INR" in the last 72 hours. ABG No results for input(s): "PHART", "HCO3" in the last 72 hours.  Invalid input(s): "PCO2", "PO2"  Studies/Results: No results found.  Anti-infectives: Anti-infectives (From admission, onward)    Start     Dose/Rate Route Frequency Ordered Stop   01/08/22 2000  piperacillin-tazobactam (ZOSYN) IVPB 3.375 g  Status:  Discontinued        3.375 g 12.5 mL/hr over 240 Minutes Intravenous Every 8 hours 01/08/22 1340 01/08/22 1608   01/08/22 2000  piperacillin-tazobactam (ZOSYN) IVPB 3.375 g  Status:  Discontinued        3.375 g 12.5 mL/hr over 240 Minutes Intravenous Every 8 hours 01/08/22 1608 01/13/22 1020   01/08/22 1400  piperacillin-tazobactam (ZOSYN) IVPB 3.375 g  Status:  Discontinued        3.375 g 100 mL/hr over 30 Minutes Intravenous Every 8 hours 01/08/22 1201 01/08/22 1339       Assessment/Plan: POD 6 s/p ex lap with SBR for SB mass/inflammatory mass, Dr. Dwain Sarna 6/19 -post op ileus resolved, will  adat -mobilize/pulm toilet -pathology benign with small bowel diverticulitis -mulit-modal pain control  -completed zosyn x5 days -discussed plan with family   FEN - soft diet, will recheck cr in am VTE - lovenox ID - zosyn x 5 days postop  dispo possibly home in am HTN  Jasmine Cantu 01/14/2022

## 2022-01-15 LAB — BASIC METABOLIC PANEL
Anion gap: 9 (ref 5–15)
BUN: 10 mg/dL (ref 8–23)
CO2: 21 mmol/L — ABNORMAL LOW (ref 22–32)
Calcium: 8.1 mg/dL — ABNORMAL LOW (ref 8.9–10.3)
Chloride: 108 mmol/L (ref 98–111)
Creatinine, Ser: 0.85 mg/dL (ref 0.44–1.00)
GFR, Estimated: >60 mL/min (ref >60)
Glucose, Bld: 105 mg/dL — ABNORMAL HIGH (ref 70–99)
Potassium: 3.7 mmol/L (ref 3.5–5.1)
Sodium: 138 mmol/L (ref 135–145)

## 2022-01-15 MED ORDER — ACETAMINOPHEN 500 MG PO TABS: 1000.0000 mg | ORAL_TABLET | Freq: Four times a day (QID) | ORAL | 2 refills | Status: AC | PRN

## 2022-01-15 MED ORDER — TRAMADOL HCL 50 MG PO TABS
50.0000 mg | ORAL_TABLET | Freq: Four times a day (QID) | ORAL | 0 refills | Status: DC | PRN
Start: 2022-01-15 — End: 2023-01-28

## 2022-01-15 MED ORDER — PANTOPRAZOLE SODIUM 40 MG PO TBEC
40.0000 mg | DELAYED_RELEASE_TABLET | Freq: Every day | ORAL | Status: DC
  Administered 2022-01-15: 40 mg via ORAL
  Filled 2022-01-15: qty 1

## 2022-01-15 NOTE — Discharge Summary (Signed)
Patient ID: Jasmine Cantu [id number] 02-22-1941 81 y.o.  Admit date: 01/08/2022 Discharge date: 01/15/2022  Admitting Diagnosis: LUQ small bowel mass with perforation and abscess  Discharge Diagnosis Patient Active Problem List   Diagnosis Date Noted   Small bowel ischemia (HCC) 01/08/2022   Perforated bowel (HCC) 01/08/2022   Reflux 05/31/2012  Ex lap with SBR  Consultants none  Reason for Admission: This is an 81 yo female with a history of HTN and GERD who presented to [facility] ED with worsening LUQ abdominal pain.  She states she has been having intermittent pain in this area for 2-3 years but she has not had it worked up.  Over the last several months it has been worsening until last week it became the worst it has been.  She then began having black and tarry stools.  These were semi-solid and not diarrhea.  She denies any fevers.  She has not been eating well, but has had not any nausea or vomiting.  Due to persistent pain that was not alleviated by anything, she presented for evaluation.  Surprisingly her labs are mostly normal.  WBC is 10.9.  hgb is the same.  Cr is 1.10.  lactic acid is normal.  Her CTA revealed a short segment of ischemia small bowel/jejunum with perforation and small multifocal fluid and gas collection in the mesentery.  We have been asked to see her in transfer here at [facility]  Procedures exploratory laparotomy with small bowel resection, Dr. Dwain Sarna 6/19  Hospital Course:  The patient was admitted and underwent the above operation on day of admission.  She tolerated this well but developed a post op ileus.  She did require an NGT to be placed on POD 2.  This remained in place a couple of days.  As bowel function returned, this was discontinued and her diet was able to be advanced as tolerated.  She was having bowel function at time of discharge as well. She was treated with 5 days of post op abx therapy.  Pathology was c/w SB diverticulitis.  She also had a JP drain  that remained serous and was removed prior to discharge as well. She was tolerating a soft diet, having bowel function, not taking pain medications, and otherwise medically stable for DC home on POD 7.  Physical Exam: Abd: soft, appropriately tender, +BS, ND, incision c/d/I with staples.  JP drain site not closed yet and still with serous drainage.  Allergies as of 01/15/2022       Reactions   Doxycycline Nausea And Vomiting   Codeine Other (See Comments)   GI upset   Hydrocodone Nausea Only        Medication List     STOP taking these medications    dextromethorphan-guaiFENesin 30-600 MG 12hr tablet Commonly known as: MUCINEX DM   loratadine 10 MG tablet Commonly known as: CLARITIN   Olopatadine HCl 0.2 % Soln   promethazine-dextromethorphan 6.25-15 MG/5ML syrup Commonly known as: PROMETHAZINE-DM       TAKE these medications    acetaminophen 500 MG tablet Commonly known as: TYLENOL Take 2 tablets (1,000 mg total) by mouth every 6 (six) hours as needed.   benazepril 5 MG tablet Commonly known as: LOTENSIN Take 5 mg by mouth daily.   cyanocobalamin 1000 MCG/ML injection Commonly known as: (VITAMIN B-12) Inject 1,000 mcg into the muscle every 30 (thirty) days.   traMADol 50 MG tablet Commonly known as: ULTRAM Take 1 tablet (50 mg total) by mouth  every 6 (six) hours as needed (mild pain).               Durable Medical Equipment  (From admission, onward)           Start     Ordered   01/11/22 1336  For home use only DME Walker rolling  Once       Question Answer Comment  Walker: With 5 Inch Wheels   Patient needs a walker to treat with the following condition Ileus following gastrointestinal surgery (HCC)      01/11/22 1336              Follow-up Information     [facility], [place] Follow up on 01/22/2022.   Specialty: General Surgery Why: This is a nurse only visit for staple removal.  2:00pm,, Arrive 30 minutes prior to your  appointment time, Please bring your insurance card and photo ID Contact information: [address] [place] [address] [phone]         Emelia Loron, MD Follow up on 02/09/2022.   Specialty: General Surgery Why: 11:10am, arrive 10:55am Contact information: [address] [place] [address] [phone]                 Signed: Barnetta Chapel, [facility] Surgery 01/15/2022, 10:14 AM Please see Amion for pager number during day hours 7:00am-4:30pm, 7-11:30am on Weekends

## 2022-02-15 ENCOUNTER — Ambulatory Visit: Payer: Medicare Other

## 2022-04-18 ENCOUNTER — Telehealth: Payer: Self-pay

## 2022-04-18 NOTE — Patient Instructions (Signed)
Visit Information  Thank you for taking time to visit with me today. Please don't hesitate to contact me if I can be of assistance to you.   Following are the goals we discussed today:   Goals Addressed             This Visit's Progress    COMPLETED: Care Coordination Activities-No follow up required       Care Coordination Interventions: Advised patient to schedule annual wellness visit. Patient to call           If you are experiencing a Mental Health or [place] or need someone to talk to, please call the Suicide and Crisis Lifeline: 988   Patient verbalizes understanding of instructions and care plan provided today and agrees to view in [place]. Active MyChart status and patient understanding of how to access instructions and care plan via MyChart confirmed with patient.     No further follow up required: decline  Jone Baseman, RN, MSN [place] [facility] Care Management Coordinator Direct Line [phone]

## 2022-04-18 NOTE — Patient Outreach (Signed)
  Care Coordination   Initial Visit Note   04/18/2022 Name: Jasmine Cantu MRN: [id number] DOB: 1940/12/26  Jasmine Cantu is a 81 y.o. year old female who sees Jasmine Stains, MD for primary care. I spoke with  Jasmine Cantu by phone today.  What matters to the patients health and wellness today?  none    Goals Addressed             This Visit's Progress    COMPLETED: Care Coordination Activities-No follow up required       Care Coordination Interventions: Advised patient to schedule annual wellness visit. Patient to call          SDOH assessments and interventions completed:  Yes     Care Coordination Interventions Activated:  Yes  Care Coordination Interventions:  Yes, provided   Follow up plan: No further intervention required.   Encounter Outcome:  Pt. Visit Completed   Jasmine Baseman, RN, MSN [place] [facility] Care Management Coordinator Direct Line [phone]

## 2022-06-29 ENCOUNTER — Other Ambulatory Visit: Payer: Self-pay | Admitting: Family Medicine

## 2022-06-29 DIAGNOSIS — M8580 Other specified disorders of bone density and structure, unspecified site: Secondary | ICD-10-CM

## 2022-12-20 ENCOUNTER — Ambulatory Visit
Admission: RE | Admit: 2022-12-20 | Discharge: 2022-12-20 | Disposition: A | Discharge status: Home / Self Care | Payer: Medicare Other | Source: Ambulatory Visit | Attending: Family Medicine | Admitting: Family Medicine

## 2022-12-20 DIAGNOSIS — M8580 Other specified disorders of bone density and structure, unspecified site: Secondary | ICD-10-CM

## 2022-12-24 ENCOUNTER — Other Ambulatory Visit: Payer: Self-pay | Admitting: Family Medicine

## 2022-12-24 DIAGNOSIS — R928 Other abnormal and inconclusive findings on diagnostic imaging of breast: Secondary | ICD-10-CM

## 2023-01-04 ENCOUNTER — Ambulatory Visit
Admission: RE | Admit: 2023-01-04 | Discharge: 2023-01-04 | Disposition: A | Discharge status: Home / Self Care | Payer: Medicare Other | Source: Ambulatory Visit | Attending: Family Medicine | Admitting: Family Medicine

## 2023-01-04 ENCOUNTER — Other Ambulatory Visit: Payer: Self-pay | Admitting: Family Medicine

## 2023-01-04 DIAGNOSIS — R928 Other abnormal and inconclusive findings on diagnostic imaging of breast: Secondary | ICD-10-CM

## 2023-01-10 ENCOUNTER — Ambulatory Visit
Admission: RE | Admit: 2023-01-10 | Discharge: 2023-01-10 | Disposition: A | Discharge status: Home / Self Care | Payer: Medicare Other | Source: Ambulatory Visit | Attending: Family Medicine | Admitting: Family Medicine

## 2023-01-10 DIAGNOSIS — R928 Other abnormal and inconclusive findings on diagnostic imaging of breast: Secondary | ICD-10-CM

## 2023-01-10 HISTORY — PX: BREAST BIOPSY: SHX20

## 2023-01-14 ENCOUNTER — Other Ambulatory Visit: Payer: Self-pay | Admitting: General Surgery

## 2023-01-14 DIAGNOSIS — Z170 Estrogen receptor positive status [ER+]: Secondary | ICD-10-CM

## 2023-01-15 ENCOUNTER — Other Ambulatory Visit: Payer: Self-pay | Admitting: General Surgery

## 2023-01-15 DIAGNOSIS — C50411 Malignant neoplasm of upper-outer quadrant of right female breast: Secondary | ICD-10-CM

## 2023-01-18 ENCOUNTER — Encounter (HOSPITAL_BASED_OUTPATIENT_CLINIC_OR_DEPARTMENT_OTHER): Payer: Self-pay | Admitting: General Surgery

## 2023-01-18 ENCOUNTER — Other Ambulatory Visit: Payer: Self-pay

## 2023-01-22 ENCOUNTER — Encounter (HOSPITAL_BASED_OUTPATIENT_CLINIC_OR_DEPARTMENT_OTHER)
Admission: RE | Admit: 2023-01-22 | Discharge: 2023-01-22 | Disposition: A | Discharge status: Home / Self Care | Payer: Medicare Other | Source: Ambulatory Visit | Attending: General Surgery | Admitting: General Surgery

## 2023-01-22 DIAGNOSIS — I10 Essential (primary) hypertension: Secondary | ICD-10-CM | POA: Insufficient documentation

## 2023-01-22 DIAGNOSIS — R001 Bradycardia, unspecified: Secondary | ICD-10-CM | POA: Diagnosis not present

## 2023-01-22 DIAGNOSIS — Z01810 Encounter for preprocedural cardiovascular examination: Secondary | ICD-10-CM | POA: Diagnosis not present

## 2023-01-22 MED ORDER — CHLORHEXIDINE GLUCONATE CLOTH 2 % EX PADS: 6.0000 | MEDICATED_PAD | Freq: Once | CUTANEOUS | Status: DC

## 2023-01-22 NOTE — Progress Notes (Signed)

## 2023-01-24 NOTE — Anesthesia Preprocedure Evaluation (Addendum)
Anesthesia Evaluation  Patient identified by MRN, date of birth, ID band Patient awake    Reviewed: Allergy & Precautions, NPO status , Patient's Chart, lab work & pertinent test results  History of Anesthesia Complications Negative for: history of anesthetic complications  Airway Mallampati: II  TM Distance: >3 FB Neck ROM: Full    Dental no notable dental hx.    Pulmonary neg pulmonary ROS   Pulmonary exam normal        Cardiovascular hypertension, Pt. on medications Normal cardiovascular exam     Neuro/Psych    GI/Hepatic Neg liver ROS, hiatal hernia,GERD  Medicated,,  Endo/Other  negative endocrine ROS    Renal/GU negative Renal ROS  negative genitourinary   Musculoskeletal negative musculoskeletal ROS (+)    Abdominal   Peds  Hematology  (+) Blood dyscrasia, anemia   Anesthesia Other Findings Right breast ca  Reproductive/Obstetrics                              Anesthesia Physical Anesthesia Plan  ASA: 2  Anesthesia Plan: General   Post-op Pain Management: Tylenol PO (pre-op)*   Induction: Intravenous  PONV Risk Score and Plan: 3 and Treatment may vary due to age or medical condition, Ondansetron and Dexamethasone  Airway Management Planned: LMA  Additional Equipment: None  Intra-op Plan:   Post-operative Plan: Extubation in OR  Informed Consent: I have reviewed the patients History and Physical, chart, labs and discussed the procedure including the risks, benefits and alternatives for the proposed anesthesia with the patient or authorized representative who has indicated his/her understanding and acceptance.     Dental advisory given  Plan Discussed with: CRNA  Anesthesia Plan Comments:          Anesthesia Quick Evaluation

## 2023-01-25 ENCOUNTER — Ambulatory Visit
Admission: RE | Admit: 2023-01-25 | Discharge: 2023-01-25 | Disposition: A | Discharge status: Home / Self Care | Payer: Medicare Other | Source: Ambulatory Visit | Attending: General Surgery | Admitting: General Surgery

## 2023-01-25 ENCOUNTER — Other Ambulatory Visit: Payer: Self-pay | Admitting: General Surgery

## 2023-01-25 DIAGNOSIS — Z170 Estrogen receptor positive status [ER+]: Secondary | ICD-10-CM

## 2023-01-25 HISTORY — PX: BREAST BIOPSY: SHX20

## 2023-01-28 ENCOUNTER — Ambulatory Visit (HOSPITAL_BASED_OUTPATIENT_CLINIC_OR_DEPARTMENT_OTHER): Payer: Medicare Other | Admitting: Anesthesiology

## 2023-01-28 ENCOUNTER — Ambulatory Visit (HOSPITAL_BASED_OUTPATIENT_CLINIC_OR_DEPARTMENT_OTHER)
Admission: RE | Admit: 2023-01-28 | Discharge: 2023-01-28 | Disposition: A | Discharge status: Home / Self Care | Payer: Medicare Other | Source: Ambulatory Visit | Attending: General Surgery | Admitting: General Surgery

## 2023-01-28 ENCOUNTER — Other Ambulatory Visit: Payer: Self-pay

## 2023-01-28 ENCOUNTER — Encounter (HOSPITAL_BASED_OUTPATIENT_CLINIC_OR_DEPARTMENT_OTHER): Payer: Self-pay | Admitting: General Surgery

## 2023-01-28 ENCOUNTER — Ambulatory Visit
Admission: RE | Admit: 2023-01-28 | Discharge: 2023-01-28 | Disposition: A | Discharge status: Home / Self Care | Payer: Medicare Other | Source: Ambulatory Visit | Attending: General Surgery | Admitting: General Surgery

## 2023-01-28 ENCOUNTER — Encounter (HOSPITAL_BASED_OUTPATIENT_CLINIC_OR_DEPARTMENT_OTHER)
Admission: RE | Disposition: A | Discharge status: Home / Self Care | Payer: Self-pay | Source: Ambulatory Visit | Attending: General Surgery

## 2023-01-28 DIAGNOSIS — C50411 Malignant neoplasm of upper-outer quadrant of right female breast: Secondary | ICD-10-CM | POA: Insufficient documentation

## 2023-01-28 DIAGNOSIS — Z800 Family history of malignant neoplasm of digestive organs: Secondary | ICD-10-CM | POA: Diagnosis not present

## 2023-01-28 DIAGNOSIS — I10 Essential (primary) hypertension: Secondary | ICD-10-CM

## 2023-01-28 DIAGNOSIS — Z170 Estrogen receptor positive status [ER+]: Secondary | ICD-10-CM | POA: Diagnosis not present

## 2023-01-28 DIAGNOSIS — C50911 Malignant neoplasm of unspecified site of right female breast: Secondary | ICD-10-CM

## 2023-01-28 DIAGNOSIS — Z801 Family history of malignant neoplasm of trachea, bronchus and lung: Secondary | ICD-10-CM | POA: Insufficient documentation

## 2023-01-28 DIAGNOSIS — Z8041 Family history of malignant neoplasm of ovary: Secondary | ICD-10-CM | POA: Insufficient documentation

## 2023-01-28 HISTORY — PX: BREAST LUMPECTOMY WITH RADIOACTIVE SEED LOCALIZATION: SHX6424

## 2023-01-28 SURGERY — BREAST LUMPECTOMY WITH RADIOACTIVE SEED LOCALIZATION
Anesthesia: General | Site: Breast | Laterality: Right

## 2023-01-28 MED ORDER — ONDANSETRON HCL 4 MG/2ML IJ SOLN
INTRAMUSCULAR | Status: DC | PRN
  Administered 2023-01-28: 4 mg via INTRAVENOUS

## 2023-01-28 MED ORDER — LACTATED RINGERS IV SOLN: INTRAVENOUS | Status: DC

## 2023-01-28 MED ORDER — ATROPINE SULFATE 0.4 MG/ML IV SOLN
INTRAVENOUS | Status: AC
  Filled 2023-01-28: qty 1

## 2023-01-28 MED ORDER — LIDOCAINE-EPINEPHRINE (PF) 1 %-1:200000 IJ SOLN
INTRAMUSCULAR | Status: DC | PRN
  Administered 2023-01-28: 30 mL via INTRAMUSCULAR

## 2023-01-28 MED ORDER — TRAMADOL HCL 50 MG PO TABS: 50.0000 mg | ORAL_TABLET | Freq: Four times a day (QID) | ORAL | 0 refills | Status: DC | PRN

## 2023-01-28 MED ORDER — BUPIVACAINE HCL (PF) 0.25 % IJ SOLN
INTRAMUSCULAR | Status: AC
  Filled 2023-01-28: qty 30

## 2023-01-28 MED ORDER — CEFAZOLIN SODIUM-DEXTROSE 2-4 GM/100ML-% IV SOLN
INTRAVENOUS | Status: AC
  Filled 2023-01-28: qty 100

## 2023-01-28 MED ORDER — FENTANYL CITRATE (PF) 100 MCG/2ML IJ SOLN: 25.0000 ug | INTRAMUSCULAR | Status: DC | PRN

## 2023-01-28 MED ORDER — DEXAMETHASONE SODIUM PHOSPHATE 10 MG/ML IJ SOLN
INTRAMUSCULAR | Status: AC
  Filled 2023-01-28: qty 1

## 2023-01-28 MED ORDER — FENTANYL CITRATE (PF) 100 MCG/2ML IJ SOLN
INTRAMUSCULAR | Status: DC | PRN
  Administered 2023-01-28 (×4): 25 ug via INTRAVENOUS

## 2023-01-28 MED ORDER — OXYCODONE HCL 5 MG PO TABS: 5.0000 mg | ORAL_TABLET | Freq: Once | ORAL | Status: DC | PRN

## 2023-01-28 MED ORDER — FENTANYL CITRATE (PF) 100 MCG/2ML IJ SOLN
INTRAMUSCULAR | Status: AC
  Filled 2023-01-28: qty 2

## 2023-01-28 MED ORDER — OXYCODONE HCL 5 MG/5ML PO SOLN: 5.0000 mg | Freq: Once | ORAL | Status: DC | PRN

## 2023-01-28 MED ORDER — PROPOFOL 10 MG/ML IV BOLUS
INTRAVENOUS | Status: DC | PRN
  Administered 2023-01-28: 90 mg via INTRAVENOUS

## 2023-01-28 MED ORDER — LIDOCAINE-EPINEPHRINE (PF) 1 %-1:200000 IJ SOLN
INTRAMUSCULAR | Status: AC
  Filled 2023-01-28: qty 30

## 2023-01-28 MED ORDER — ONDANSETRON HCL 4 MG/2ML IJ SOLN
INTRAMUSCULAR | Status: AC
  Filled 2023-01-28: qty 2

## 2023-01-28 MED ORDER — EPHEDRINE 5 MG/ML INJ
INTRAVENOUS | Status: AC
  Filled 2023-01-28: qty 5

## 2023-01-28 MED ORDER — DEXAMETHASONE SODIUM PHOSPHATE 10 MG/ML IJ SOLN
INTRAMUSCULAR | Status: DC | PRN
  Administered 2023-01-28: 5 mg via INTRAVENOUS

## 2023-01-28 MED ORDER — ACETAMINOPHEN 500 MG PO TABS
ORAL_TABLET | ORAL | Status: AC
  Filled 2023-01-28: qty 2

## 2023-01-28 MED ORDER — ACETAMINOPHEN 500 MG PO TABS
1000.0000 mg | ORAL_TABLET | ORAL | Status: AC
  Administered 2023-01-28: 1000 mg via ORAL

## 2023-01-28 MED ORDER — LIDOCAINE 2% (20 MG/ML) 5 ML SYRINGE
INTRAMUSCULAR | Status: AC
  Filled 2023-01-28: qty 5

## 2023-01-28 MED ORDER — LIDOCAINE HCL (CARDIAC) PF 100 MG/5ML IV SOSY
PREFILLED_SYRINGE | INTRAVENOUS | Status: DC | PRN
  Administered 2023-01-28: 60 mg via INTRAVENOUS

## 2023-01-28 MED ORDER — EPHEDRINE SULFATE (PRESSORS) 50 MG/ML IJ SOLN
INTRAMUSCULAR | Status: DC | PRN
  Administered 2023-01-28: 5 mg via INTRAVENOUS
  Administered 2023-01-28: 10 mg via INTRAVENOUS
  Administered 2023-01-28: 5 mg via INTRAVENOUS

## 2023-01-28 MED ORDER — PROPOFOL 10 MG/ML IV BOLUS
INTRAVENOUS | Status: AC
  Filled 2023-01-28: qty 20

## 2023-01-28 MED ORDER — CEFAZOLIN SODIUM-DEXTROSE 2-4 GM/100ML-% IV SOLN
2.0000 g | INTRAVENOUS | Status: AC
  Administered 2023-01-28: 2 g via INTRAVENOUS

## 2023-01-28 SURGICAL SUPPLY — 54 items
ADH SKN CLS APL DERMABOND .7 (GAUZE/BANDAGES/DRESSINGS) ×1
APL PRP STRL LF DISP 70% ISPRP (MISCELLANEOUS) ×1
BINDER BREAST LRG (GAUZE/BANDAGES/DRESSINGS) IMPLANT
BINDER BREAST MEDIUM (GAUZE/BANDAGES/DRESSINGS) IMPLANT
BINDER BREAST XLRG (GAUZE/BANDAGES/DRESSINGS) IMPLANT
BINDER BREAST XXLRG (GAUZE/BANDAGES/DRESSINGS) IMPLANT
BLADE SURG 10 STRL SS (BLADE) ×2 IMPLANT
BLADE SURG 15 STRL LF DISP TIS (BLADE) IMPLANT
BLADE SURG 15 STRL SS (BLADE)
CANISTER SUC SOCK COL 7IN (MISCELLANEOUS) IMPLANT
CANISTER SUCT 1200ML W/VALVE (MISCELLANEOUS) IMPLANT
CHLORAPREP W/TINT 26 (MISCELLANEOUS) ×2 IMPLANT
CLIP TI LARGE 6 (CLIP) ×2 IMPLANT
CLIP TI MEDIUM 6 (CLIP) IMPLANT
COVER BACK TABLE 60X90IN (DRAPES) ×2 IMPLANT
COVER MAYO STAND STRL (DRAPES) ×2 IMPLANT
COVER PROBE CYLINDRICAL 5X96 (MISCELLANEOUS) ×2 IMPLANT
DERMABOND ADVANCED .7 DNX12 (GAUZE/BANDAGES/DRESSINGS) ×2 IMPLANT
DRAPE LAPAROSCOPIC ABDOMINAL (DRAPES) ×2 IMPLANT
DRAPE UTILITY XL STRL (DRAPES) ×2 IMPLANT
ELECT COATED BLADE 2.86 ST (ELECTRODE) ×2 IMPLANT
ELECT REM PT RETURN 9FT ADLT (ELECTROSURGICAL) ×1
ELECTRODE REM PT RTRN 9FT ADLT (ELECTROSURGICAL) ×2 IMPLANT
GAUZE SPONGE 4X4 12PLY STRL LF (GAUZE/BANDAGES/DRESSINGS) ×2 IMPLANT
GLOVE BIO SURGEON STRL SZ 6 (GLOVE) ×2 IMPLANT
GLOVE BIOGEL PI IND STRL 6.5 (GLOVE) ×2 IMPLANT
GLOVE BIOGEL PI IND STRL 7.0 (GLOVE) IMPLANT
GLOVE SURG SS PI 6.5 STRL IVOR (GLOVE) IMPLANT
GOWN STRL REUS W/ TWL LRG LVL3 (GOWN DISPOSABLE) ×2 IMPLANT
GOWN STRL REUS W/ TWL XL LVL3 (GOWN DISPOSABLE) ×2 IMPLANT
GOWN STRL REUS W/TWL LRG LVL3 (GOWN DISPOSABLE) ×1
GOWN STRL REUS W/TWL XL LVL3 (GOWN DISPOSABLE) ×1
KIT MARKER MARGIN INK (KITS) ×2 IMPLANT
LIGHT WAVEGUIDE WIDE FLAT (MISCELLANEOUS) IMPLANT
NDL HYPO 25X1 1.5 SAFETY (NEEDLE) ×2 IMPLANT
NEEDLE HYPO 25X1 1.5 SAFETY (NEEDLE) ×1 IMPLANT
NS IRRIG 1000ML POUR BTL (IV SOLUTION) ×2 IMPLANT
PACK BASIN DAY SURGERY FS (CUSTOM PROCEDURE TRAY) ×2 IMPLANT
PENCIL SMOKE EVACUATOR (MISCELLANEOUS) ×2 IMPLANT
SLEEVE SCD COMPRESS KNEE MED (STOCKING) ×2 IMPLANT
SPIKE FLUID TRANSFER (MISCELLANEOUS) IMPLANT
SPONGE T-LAP 18X18 ~~LOC~~+RFID (SPONGE) ×2 IMPLANT
STRIP CLOSURE SKIN 1/2X4 (GAUZE/BANDAGES/DRESSINGS) ×2 IMPLANT
SUT MNCRL AB 4-0 PS2 18 (SUTURE) ×2 IMPLANT
SUT SILK 2 0 SH (SUTURE) IMPLANT
SUT VIC AB 2-0 SH 27 (SUTURE) ×1
SUT VIC AB 2-0 SH 27XBRD (SUTURE) ×2 IMPLANT
SUT VIC AB 3-0 SH 27 (SUTURE) ×1
SUT VIC AB 3-0 SH 27X BRD (SUTURE) ×2 IMPLANT
SYR CONTROL 10ML LL (SYRINGE) ×2 IMPLANT
TOWEL GREEN STERILE FF (TOWEL DISPOSABLE) ×2 IMPLANT
TRAY FAXITRON CT DISP (TRAY / TRAY PROCEDURE) ×2 IMPLANT
TUBE CONNECTING 20X1/4 (TUBING) IMPLANT
YANKAUER SUCT BULB TIP NO VENT (SUCTIONS) IMPLANT

## 2023-01-28 NOTE — Anesthesia Procedure Notes (Signed)
Procedure Name: LMA Insertion Date/Time: 01/28/2023 7:48 AM  Performed by: Lauralyn Primes, CRNAPre-anesthesia Checklist: Patient identified, Emergency Drugs available, Suction available and Patient being monitored Patient Re-evaluated:Patient Re-evaluated prior to induction Oxygen Delivery Method: Circle system utilized Preoxygenation: Pre-oxygenation with 100% oxygen Induction Type: IV induction Ventilation: Mask ventilation without difficulty LMA: LMA inserted LMA Size: 4.0 Number of attempts: 1 Airway Equipment and Method: Bite block Placement Confirmation: positive ETCO2 Tube secured with: Tape Dental Injury: Teeth and Oropharynx as per pre-operative assessment

## 2023-01-28 NOTE — Interval H&P Note (Signed)
History and Physical Interval Note:  01/28/2023 7:32 AM  Jasmine Cantu  has presented today for surgery, with the diagnosis of RIGHT BREAST CANCER.  The various methods of treatment have been discussed with the patient and family. After consideration of risks, benefits and other options for treatment, the patient has consented to  Procedure(s): RIGHT BREAST LUMPECTOMY WITH RADIOACTIVE SEED LOCALIZATION (Right) as a surgical intervention.  The patient's history has been reviewed, patient examined, no change in status, stable for surgery.  I have reviewed the patient's chart and labs.  Questions were answered to the patient's satisfaction.     Almond Lint

## 2023-01-28 NOTE — H&P (Signed)
REFERRING PHYSICIAN: Emmaline Kluver, MD  PROVIDER: Matthias Hughs, MD  Care Team: Patient Care Team: None (Inactive) as PCP - General Tommy Medal, MD as Consulting Provider (General Surgery) Matthias Hughs, MD as Consulting Provider (Surgical Oncology)  MRN: [id number] DOB: Jun 12, 1941 DATE OF ENCOUNTER: 01/14/2023  Subjective  Chief Complaint: Right Breast Cancer  History of Present Illness: Jasmine Cantu is a 82 y.o. female who is seen today as an office consultation at the request of Dr. Cliffton Asters for evaluation of Right Breast Cancer  Pt presents with a new diagnosis of right breast cancer 12/2022. Patient had screening detected mass with calcifications. Diagnostic imaging demonstrated a 12 mm mass at 11 o'clock on the right. Core needle biopsy was performed. 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  Family cancer history - brother with lung cancer, mother with colon cancer, sister with ovarian cancer  Of note, patient had surgery last summer with Dr. Dwain Sarna for ischemic bowel. She has recovered well from that.  Diagnostic mammogram/us BCG 01/04/23 ACR Breast Density Category b: There are scattered areas of fibroglandular density.  FINDINGS: There is an irregular mass with spiculations containing calcifications in the right breast. The mass is high in density.  Targeted ultrasound is performed, showing there is an irregular mass in the right breast at 11 o'clock, 2 cm from the nipple measuring 12 x 11 x 9 mm. The mass demonstrates parallel orientation and posterior shadowing. The mass is spiculated. Internal blood flow is identified. Surrounding fibrocystic changes are noted. No axillary adenopathy.  IMPRESSION: Highly suspicious irregular mass in the right breast at 11 o'clock. No axillary adenopathy. Surrounding fibrocystic changes.  RECOMMENDATION: Recommend ultrasound-guided biopsy of the 11 o'clock right  breast mass.  I have discussed the findings and recommendations with the patient. If applicable, a reminder letter will be sent to the patient regarding the next appointment.  BI-RADS CATEGORY 5: Highly suggestive of malignancy.   Pathology core needle biopsy: 01/10/23 Breast, right, needle core biopsy, 11:00 2 cmfn - INVASIVE DUCTAL CARCINOMA, SEE NOTE - FOCAL DUCTAL CARCINOMA IN SITU, CRIBRIFORM TYPE, NUCLEAR GRADE 2 OF 3 - TUBULE FORMATION: SCORE 2 OF 3 - NUCLEAR PLEOMORPHISM: SCORE 2 OF 3 - MITOTIC COUNT: SCORE 1 OF 3 - TOTAL SCORE: 5 OF 9 - OVERALL GRADE: I/III - LYMPHOVASCULAR INVASION: NOT IDENTIFIED - CANCER LENGTH: 9 MM IN GREATEST LINEAR DIMENSION - CALCIFICATIONS: PRESENT  Receptors: The tumor cells are EQUIVOCAL for Her2 (2+). Her2 by FISH will be performed and the results will be reported separately. Estrogen Receptor: POSITIVE, 100%, STRONG STAINING INTENSITY Progesterone Receptor: POISITVE, 100%. STRONG STAINING INTENSITY Proliferation Marker Ki-67: 5%  Review of Systems: A complete review of systems was obtained from the patient. I have reviewed this information and discussed as appropriate with the patient. See HPI as well for other ROS.  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 Dysuria Conductive hearing loss, bilateral Chronic pansinusitis Bilateral impacted cerumen Malignant neoplasm of upper-outer quadrant of right breast in female, estrogen receptor positive (CMS/HHS-HCC)  Past Surgical History: Procedure Laterality Date RESECTION SMALL BOWEL   Allergies Allergen Reactions Doxycycline Nausea and Nausea  And Vomiting Codeine Vomiting, Nausea and Other (See Comments) GI upset  Current Outpatient Medications on File Prior to Visit Medication  Sig Dispense Refill acetaminophen (TYLENOL) 500 MG tablet Take 1,000 mg by mouth every 6 (six) hours as needed traMADoL (ULTRAM) 50 mg tablet Take by mouth benazepriL (LOTENSIN) 5 MG tablet Take 5 mg by mouth once daily cyanocobalamin (VITAMIN B12) 1,000 mcg/mL injection INJECT ONCE A MONTH  No current facility-administered medications on file prior to visit.  History reviewed. No pertinent family history.  Social History  Tobacco Use Smoking Status Never Smokeless Tobacco Never   Social History  Socioeconomic History Marital status: Single Tobacco Use Smoking status: Never Smokeless tobacco: Never Substance and Sexual Activity Alcohol use: Not Currently Drug use: Never  Social Determinants of Health  Transportation Needs: No Transportation Needs (04/18/2022) [facility] from [facility] - Transportation Lack of Transportation (Medical): No Lack of Transportation (Non-Medical): No  Objective:  Vitals: 01/14/23 1231 BP: (!) 142/68 Pulse: 72 Temp: 36.7 C (98 F) SpO2: 98% Weight: 63.6 kg (140 lb 3.2 oz) Height: 158.8 cm (5' 2.5") PainSc: 0-No pain  Body mass index is 25.23 kg/m.  Gen: No acute distress. Well nourished and well groomed. Neurological: Alert and oriented to person, place, and time. Coordination normal. Head: Normocephalic and atraumatic. Eyes: Conjunctivae are normal. Pupils are equal, round, and reactive to light. No scleral icterus. Neck: Normal range of motion. Neck supple. No tracheal deviation or thyromegaly present. Cardiovascular: Normal rate, regular rhythm, normal heart sounds and intact distal pulses. Exam reveals no gallop and no friction rub. No murmur heard. Breast: Ptotic bilaterally. Some bruising right upper breast. No palpable masses. No nipple retraction, no nipple discharge. No  LAD. Respiratory: Effort normal. No respiratory distress. No chest wall tenderness. Breath sounds normal. No wheezes, rales or rhonchi. GI: Soft. Bowel sounds are normal. The abdomen is soft and nontender. There is no rebound and no guarding. Musculoskeletal: Normal range of motion. Extremities are nontender. Lymphadenopathy: No cervical, preauricular, postauricular or axillary adenopathy is present Skin: Skin is warm and dry. No rash noted. No diaphoresis. No erythema. No pallor. No clubbing, cyanosis, or edema. Psychiatric: Normal mood and affect. Behavior is normal. Judgment and thought content normal.  Labs No recent labs.  Assessment and Plan:  ICD-10-CM 1. Malignant neoplasm of upper-outer quadrant of right breast in female, estrogen receptor positive (CMS/HHS-HCC) C50.411 Z17.0   Pt has a new diagnosis of cT1cN0 right breast cancer. Given age, pathology, size of tumor, and prognostic panel, she is a good candidate for seed localized lumpectomy alone without sentinel node biopsy.  I will refer patient to medical and radiation oncology. Possible radiation and antihormonal tx likely to based on final pathology.  I discussed surgery with the patient and her family.  The surgical procedure was described to the patient. I discussed the incision type and location and that we will need radiology involved with a seed marker.  We discussed the risks bleeding, infection, damage to other structures, need for further procedures/surgeries. We discussed the risk of seroma. The patient was advised if the breast has cancer, we may need to go back to surgery for additional tissue to obtain negative margins or for a lymph node biopsy. The patient was advised that these are the most common complications, but that others can occur as well. I discussed the risk of alteration in breast contour or size. I discussed risk of chronic pain. There are rare instances of heart/lung issues post op as well as blood  clots.  They were advised against taking aspirin or other anti-inflammatory agents/blood thinners the week before surgery.  The risks and benefits of the procedure were described to the patient and she wishes to proceed.

## 2023-01-28 NOTE — Anesthesia Postprocedure Evaluation (Signed)
Anesthesia Post Note  Patient: Jasmine Cantu  Procedure(s) Performed: RIGHT BREAST LUMPECTOMY WITH RADIOACTIVE SEED LOCALIZATION (Right: Breast)     Patient location during evaluation: PACU Anesthesia Type: General Level of consciousness: awake and alert Pain management: pain level controlled Vital Signs Assessment: post-procedure vital signs reviewed and stable Respiratory status: spontaneous breathing, nonlabored ventilation and respiratory function stable Cardiovascular status: blood pressure returned to baseline Postop Assessment: no apparent nausea or vomiting Anesthetic complications: no   No notable events documented.  Last Vitals:  Vitals:   01/28/23 0845 01/28/23 0900  BP: (!) 136/55 (!) 143/68  Pulse: 78 67  Resp: 15 12  Temp: (!) 36.2 C   SpO2: 100% 100%    Last Pain:  Vitals:   01/28/23 0900  TempSrc:   PainSc: 1                  Shanda Howells

## 2023-01-28 NOTE — Op Note (Signed)
Right Breast Radioactive seed localized lumpectomy  Indications: This patient presents with history of right breast cancer, upper outer quadrant, cT1cN0, grade 1 invasive ductal carcinoma, receptors +/+/-  Pre-operative Diagnosis: right breast cancer  Post-operative Diagnosis: Same  Surgeon: Almond Lint   Anesthesia: General endotracheal anesthesia  ASA Class: 2  Procedure Details  The patient was seen in the Holding Room. The risks, benefits, complications, treatment options, and expected outcomes were discussed with the patient. The possibilities of bleeding, infection, the need for additional procedures, failure to diagnose a condition, and creating a complication requiring other procedures or operations were discussed with the patient. The patient concurred with the proposed plan, giving informed consent.  The site of surgery properly noted/marked. The patient was taken to Operating Room # 8, identified, and the procedure verified as right breast seed localized lumpectomy.  The right breast and chest were prepped and draped in standard fashion. A circumlinear incision was made near the previously placed radioactive seed.  Dissection was carried down around the point of maximum signal intensity. The cautery was used to perform the dissection.   The specimen was inked with the margin marker paint kit.    Specimen radiography confirmed inclusion of the mammographic lesion, the clip, and the seed.  The background signal in the breast was zero.  Hemostasis was achieved with cautery.  The cavity was marked with clips on each border other than the anterior border.  Three 2-0 vicryl mastopexy sutures were placed and the anterior tissue was freed up to diminish the size of the cavity.  The wound was irrigated and closed with 3-0 vicryl interrupted deep dermal sutures and 4-0 monocryl running subcuticular suture.      Sterile dressings were applied. At the end of the operation, all sponge, instrument,  and needle counts were correct.   Findings: Seed, clip in specimen.  anterior margin is skin, posterior margin is pectoralis   Estimated Blood Loss:  min         Specimens: right breast tissue with seed         Complications:  None; patient tolerated the procedure well.         Disposition: PACU - hemodynamically stable.         Condition: stable

## 2023-01-28 NOTE — Transfer of Care (Signed)
Immediate Anesthesia Transfer of Care Note  Patient: Jasmine Cantu  Procedure(s) Performed: RIGHT BREAST LUMPECTOMY WITH RADIOACTIVE SEED LOCALIZATION (Right: Breast)  Patient Location: PACU  Anesthesia Type:General  Level of Consciousness: awake, alert , and oriented  Airway & Oxygen Therapy: Patient Spontanous Breathing and Patient connected to face mask oxygen  Post-op Assessment: Report given to RN and Post -op Vital signs reviewed and stable  Post vital signs: Reviewed and stable  Last Vitals:  Vitals Value Taken Time  BP 136/55 (80)   Temp    Pulse 78 01/28/23 0846  Resp 15 01/28/23 0846  SpO2 100 % 01/28/23 0846  Vitals shown include unvalidated device data.  Last Pain:  Vitals:   01/28/23 0628  TempSrc: Oral  PainSc: 0-No pain      Patients Stated Pain Goal: 3 (01/28/23 1610)  Complications: No notable events documented.

## 2023-01-28 NOTE — Discharge Instructions (Addendum)
[facility] Office Phone Number [phone]  BREAST BIOPSY/ PARTIAL MASTECTOMY: POST OP INSTRUCTIONS  Always review your discharge instruction sheet given to you by the facility where your surgery was performed.  IF YOU HAVE DISABILITY OR FAMILY LEAVE FORMS, YOU MUST BRING THEM TO THE OFFICE FOR PROCESSING.  DO NOT GIVE THEM TO YOUR DOCTOR.  Take 2 tylenol (acetominophen) three times a day for 3 days.  If you still have pain, add ibuprofen with food in between if able to take this (if you have kidney issues or stomach issues, do not take ibuprofen).  If both of those are not enough, add the narcotic pain pill.  If you find you are needing a lot of this overnight after surgery, call the next morning for a refill.    Prescriptions will not be filled after 5pm or on week-ends. Take your usually prescribed medications unless otherwise directed You should eat very light the first 24 hours after surgery, such as soup, crackers, pudding, etc.  Resume your normal diet the day after surgery. Most patients will experience some swelling and bruising in the breast.  Ice packs and a good support bra will help.  Swelling and bruising can take several days to resolve.  It is common to experience some constipation if taking pain medication after surgery.  Increasing fluid intake and taking a stool softener will usually help or prevent this problem from occurring.  A mild laxative (Milk of Magnesia or Miralax) should be taken according to package directions if there are no bowel movements after 48 hours. Unless discharge instructions indicate otherwise, you may remove your bandages 48 hours after surgery, and you may shower at that time.  You may have steri-strips (small skin tapes) in place directly over the incision.  These strips should be left on the skin at least for for 7-10 days.    ACTIVITIES:  You may resume regular daily activities (gradually increasing) beginning the next day.  Wearing a  good support bra or sports bra (or the breast binder) minimizes pain and swelling.  You may have sexual intercourse when it is comfortable. No heavy lifting for 1-2 weeks (not over around 10 pounds).  You may drive when you no longer are taking prescription pain medication, you can comfortably wear a seatbelt, and you can safely maneuver your car and apply brakes. RETURN TO WORK:  __________3-14 days depending on job. _______________ Jasmine Cantu should see your doctor in the office for a follow-up appointment approximately two weeks after your surgery.  Your doctor's nurse will typically make your follow-up appointment when she calls you with your pathology report.  Expect your pathology report 3-4 business days after your surgery.  You may call to check if you do not hear from [place] after three days.   WHEN TO CALL YOUR DOCTOR: Fever over 101.0 Nausea and/or vomiting. Extreme swelling or bruising. Continued bleeding from incision. Increased pain, redness, or drainage from the incision.  The clinic staff is available to answer your questions during regular business hours.  Please don't hesitate to call and ask to speak to one of the nurses for clinical concerns.  If you have a medical emergency, go to the nearest emergency room or call 911.  A surgeon from [facility] is always on call at the hospital.  For further questions, please visit [email or web address]    Post Anesthesia Home Care Instructions  Activity: Get plenty of rest for the remainder of the day. A responsible individual must stay  with you for 24 hours following the procedure.  For the next 24 hours, DO NOT: -Drive a car -Advertising copywriter -Drink alcoholic beverages -Take any medication unless instructed by your physician -Make any legal decisions or sign important papers.  Meals: Start with liquid foods such as gelatin or soup. Progress to regular foods as tolerated. Avoid greasy, spicy, heavy foods. If nausea  and/or vomiting occur, drink only clear liquids until the nausea and/or vomiting subsides. Call your physician if vomiting continues.  Special Instructions/Symptoms: Your throat may feel dry or sore from the anesthesia or the breathing tube placed in your throat during surgery. If this causes discomfort, gargle with warm salt water. The discomfort should disappear within 24 hours.  If you had a scopolamine patch placed behind your ear for the management of post- operative nausea and/or vomiting:  1. The medication in the patch is effective for 72 hours, after which it should be removed.  Wrap patch in a tissue and discard in the trash. Wash hands thoroughly with soap and water. 2. You may remove the patch earlier than 72 hours if you experience unpleasant side effects which may include dry mouth, dizziness or visual disturbances. 3. Avoid touching the patch. Wash your hands with soap and water after contact with the patch.  No tylenol until after 12:30 today.

## 2023-01-29 ENCOUNTER — Encounter (HOSPITAL_BASED_OUTPATIENT_CLINIC_OR_DEPARTMENT_OTHER): Payer: Self-pay | Admitting: General Surgery

## 2023-01-30 LAB — SURGICAL PATHOLOGY

## 2023-02-12 ENCOUNTER — Telehealth: Payer: Self-pay | Admitting: Radiation Oncology

## 2023-02-12 ENCOUNTER — Other Ambulatory Visit: Payer: Self-pay | Admitting: *Deleted

## 2023-02-12 ENCOUNTER — Telehealth: Payer: Self-pay | Admitting: Hematology and Oncology

## 2023-02-12 DIAGNOSIS — C50411 Malignant neoplasm of upper-outer quadrant of right female breast: Secondary | ICD-10-CM | POA: Insufficient documentation

## 2023-02-12 NOTE — Telephone Encounter (Signed)
LVM to schedule CON with Dr. Lisbeth Renshaw

## 2023-02-12 NOTE — Telephone Encounter (Signed)
Scheduled appointment per referral. Patient is aware of appointment date and time. Patient is aware to arrive 15 mins prior to appointment time and to bring updated insurance cards. Patient is aware of location.   

## 2023-02-13 NOTE — Progress Notes (Signed)
Location of Breast Cancer: Malignant neoplasm of upper-outer quadrant of right breast in female, estrogen receptor positive, DCIS right breast  Histology per Pathology Report:  01-28-23 FINAL MICROSCOPIC DIAGNOSIS:  A. BREAST, RIGHT, LUMPECTOMY: - Invasive ductal carcinoma, 1.3 cm, grade 1 - Ductal carcinoma in situ: Solid and cribriform type, intermediate grade - Margins, invasive: All margins negative for invasive carcinoma     Closest, invasive: Posterior margin, 0.2 cm - Margins, DCIS: All margins negative for ductal carcinoma in situ     Closest, DCIS: Anterior, 0.5 cm - Lymphovascular invasion:  Not identified - Prognostic markers:  ER positive, 100%, strong staining intensity, PR100%, strong staining intensity , Her2 negative, Ki-67 5% - Biopsy site changes present - See oncology table  ONCOLOGY TABLE:  INVASIVE CARCINOMA OF THE BREAST:  Resection  Procedure: Lumpectomy Specimen Laterality: Right Histologic Type: Invasive ductal carcinoma Histologic Grade:      Glandular (Acinar)/Tubular Differentiation: 2      Nuclear Pleomorphism: 2      Mitotic Rate: 1      Overall Grade: 1 Tumor Size: 1.3 cm Ductal Carcinoma In Situ: Present Lymphatic and/or Vascular Invasion: Not identified Treatment Effect in the Breast: No known presurgical therapy Margins: All margins negative for invasive carcinoma      Distance from Closest Margin (mm): 2 mm      Specify Closest Margin (required only if <31mm): Posterior margin DCIS Margins: Uninvolved by DCIS      Distance from Closest Margin (mm): 5 mm      Specify Closest Margin (required only if <88mm): Anterior Regional Lymph Nodes: Not applicable      Distant Metastasis:      Distant Site(s) Involved: Not applicable Breast Biomarker Testing Performed on Previous Biopsy:      Testing Performed on Case Number: [id number]            Estrogen Receptor: Positive, 100%, strong staining intensity            Progesterone Receptor:  Positive, 100%, strong staining intensity            HER2: Negative            Ki-67: 5% Pathologic Stage Classification (pTNM, AJCC 8th Edition): pT1c, pN[not assigned] Representative Tumor Block: A3 Comment(s): None ([id number])   Receptor Status: ER(100%), PR (100%), Her2-neu (neg), Ki-67(5%)  Did patient present with symptoms (if so, please note symptoms) or was this found on screening mammography?: screening mammogram  Past/Anticipated interventions by surgeon, if any: 01-28-23 Right Breast Radioactive seed localized lumpectomy   Indications: This patient presents with history of right breast cancer, upper outer quadrant, cT1cN0, grade 1 invasive ductal carcinoma, receptors +/+/-  Pre-operative Diagnosis: right breast cancer   Post-operative Diagnosis: Same   Surgeon: Almond Lint   Past/Anticipated interventions by medical oncology, if any:  Rachel Moulds, MD 02/14/2023  Oncology History  Malignant neoplasm of upper-outer quadrant of right breast in female, estrogen receptor positive (HCC)  12/20/2022 Mammogram    Possible mass in the right breast noted on screening mammogram.Highly suspicious irregular mass in the right breast at 11 o'clock. No axillary adenopathy. Surrounding fibrocystic changes. Recommend ultrasound-guided biopsy of the 11 o'clock right breast mass.    01/10/2023 Pathology Results    Right breast needle core biopsy at 11 0 clock 2 cm from nipple show grade 1 IDC, focal DCIS, cribriform type. ER pos 100% strong staining, PR positive 100% strong staining intensity Ki 67 5%    02/12/2023 Initial Diagnosis  Malignant neoplasm of upper-outer quadrant of right breast in female, estrogen receptor positive (HCC)    ASSESSMENT AND PLAN:  Malignant neoplasm of upper-outer quadrant of right breast in female, estrogen receptor positive (HCC) This is a very pleasant 82 year old female patient with newly diagnosed right breast invasive ductal carcinoma detected on  a screening mammogram status post lumpectomy with final pathology showing a grade 1, 1.3 cm IDC, DCIS, all margins negative, prognostic showing ER 100% strong staining PR 100% strong staining, HER2 negative, Ki-67 of 5% referred to medical oncology for additional recommendations.  We have reviewed the pathology results in detail.  Given her age and frailty, we have discussed about Oncotype but we have agreed to not proceed with it.  Also given her low-grade, favorable prognostics, I do not believe she will have high Oncotype to warrant any adjuvant chemotherapy.  I have hence recommended to proceed with adjuvant radiation if this is an option followed by antiestrogen therapy.  We have discussed options for antiestrogen therapy today in detail.  We have reviewed mechanism of action of tamoxifen versus aromatase centimeters, adverse effects with each class.  At this time she is leaning towards aromatase inhibitors.  We have reviewed the side effects from aromatase inhibitors including but not limited to postmenopausal symptoms such as hot flashes, vaginal dryness, arthralgias, bone density changes, increased risk of cardiovascular events.  She has osteopenia at baseline.  I will communicate with the radiation oncology team to please keep [place] posted if she is not going to proceed with radiation so we can initiate antiestrogen therapy sooner.   Lymphedema issues, if any:  none  Pain issues, if any:  Pt reports mild to moderate pain under her right arm (this started after clips were placed). She takes tylenol, uses ice, and elevates the arm as well. She denies redness or swelling in the area. She does describe the area as hard.   SAFETY ISSUES: Prior radiation? none Pacemaker/ICD? no Possible current pregnancy?no Is the patient on methotrexate? no  Current Complaints / other details:  Family cancer history - brother with lung cancer, mother with colon cancer, sister with ovarian cancer. Her family reports  overall she does very well for her age though she does have memory issues at times. No major concerns at this time.

## 2023-02-14 ENCOUNTER — Inpatient Hospital Stay: Payer: Medicare Other | Attending: Hematology and Oncology | Admitting: Hematology and Oncology

## 2023-02-14 ENCOUNTER — Inpatient Hospital Stay: Payer: Medicare Other

## 2023-02-14 VITALS — BP 150/62 | HR 65 | Temp 97.9°F | Resp 18 | Ht 63.0 in | Wt 138.7 lb

## 2023-02-14 DIAGNOSIS — Z800 Family history of malignant neoplasm of digestive organs: Secondary | ICD-10-CM

## 2023-02-14 DIAGNOSIS — Z809 Family history of malignant neoplasm, unspecified: Secondary | ICD-10-CM

## 2023-02-14 DIAGNOSIS — Z170 Estrogen receptor positive status [ER+]: Secondary | ICD-10-CM

## 2023-02-14 DIAGNOSIS — C50411 Malignant neoplasm of upper-outer quadrant of right female breast: Secondary | ICD-10-CM

## 2023-02-14 DIAGNOSIS — M8580 Other specified disorders of bone density and structure, unspecified site: Secondary | ICD-10-CM | POA: Diagnosis not present

## 2023-02-14 NOTE — Progress Notes (Signed)
[facility] CONSULT NOTE  Patient Care Team: Laurann Montana, MD as PCP - General (Family Medicine)  CHIEF COMPLAINTS/PURPOSE OF CONSULTATION:  Newly diagnosed breast cancer  HISTORY OF PRESENTING ILLNESS:  Jasmine Cantu 82 y.o. female is here because of recent diagnosis of right breast cancer  I reviewed her records extensively and collaborated the history with the patient.  SUMMARY OF ONCOLOGIC HISTORY: Oncology History  Malignant neoplasm of upper-outer quadrant of right breast in female, estrogen receptor positive (HCC)  12/20/2022 Mammogram   Possible mass in the right breast noted on screening mammogram.Highly suspicious irregular mass in the right breast at 11 o'clock. No axillary adenopathy. Surrounding fibrocystic changes. Recommend ultrasound-guided biopsy of the 11 o'clock right breast mass.   01/10/2023 Pathology Results   Right breast needle core biopsy at 11 0 clock 2 cm from nipple show grade 1 IDC, focal DCIS, cribriform type. ER pos 100% strong staining, PR positive 100% strong staining intensity Ki 67 5%   02/12/2023 Initial Diagnosis   Malignant neoplasm of upper-outer quadrant of right breast in female, estrogen receptor positive [facility])    Jasmine Cantu is here for an initial visit with her son.  She had right breast lumpectomy which showed a grade 1 1.3 cm IDC, DCIS, all margins negative, prognostic showed ER 100% strong staining PR 100% strong staining, HER2 negative, Ki-67 of 5% Jasmine Cantu has been recovering very well except for some pain in the area.  She has her postop appointment scheduled with Dr. Donell Beers.  MEDICAL HISTORY:  Past Medical History:  Diagnosis Date   Allergy    Anemia    Cataract    Collagenous colitis 2004   Diverticulosis    Esophageal stricture 1988   GERD (gastroesophageal reflux disease)    H. pylori infection    Hiatal hernia    Hypertension    IBS (irritable bowel syndrome)    Iron deficiency anemia    Tubular adenoma of  colon    Ulcer    Vitamin B12 deficiency     SURGICAL HISTORY: Past Surgical History:  Procedure Laterality Date   APPENDECTOMY     BLADDER SUSPENSION     BREAST BIOPSY Right 01/10/2023   [place] RT BREAST BX W LOC DEV 1ST LESION IMG BX SPEC US GUIDE 01/10/2023 [facility]   BREAST BIOPSY  01/25/2023   [place] RT RADIOACTIVE SEED LOC 01/25/2023 [facility]   BREAST LUMPECTOMY WITH RADIOACTIVE SEED LOCALIZATION Right 01/28/2023   Procedure: RIGHT BREAST LUMPECTOMY WITH RADIOACTIVE SEED LOCALIZATION;  Surgeon: Almond Lint, MD;  Location: [facility];  Service: General;  Laterality: Right;   CHOLECYSTECTOMY     EYE SURGERY     LAPAROTOMY N/A 01/08/2022   Procedure: EXPLORATORY LAPAROTOMY, SMALL BOWEL RESECTION;  Surgeon: Emelia Loron, MD;  Location: [facility];  Service: General;  Laterality: N/A;   VAGINAL HYSTERECTOMY      SOCIAL HISTORY: Social History   Socioeconomic History   Marital status: Single    Spouse name: Not on file   Number of children: Not on file   Years of education: Not on file   Highest education level: Not on file  Occupational History   Not on file  Tobacco Use   Smoking status: Never   Smokeless tobacco: Never  Vaping Use   Vaping status: Never Used  Substance and Sexual Activity   Alcohol use: No   Drug use: No   Sexual activity: Yes    Birth control/protection: Surgical  Other Topics Concern   Not on file  Social History Narrative   Not on file   Social Determinants of Health   Financial Resource Strain: Not on file  Food Insecurity: Not on file  Transportation Needs: No Transportation Needs (04/18/2022)   PRAPARE - Administrator, Civil Service (Medical): No    Lack of Transportation (Non-Medical): No  Physical Activity: Not on file  Stress: Not on file  Social Connections: Not on file  Intimate Partner Violence: Not on file    FAMILY HISTORY: Family History  Problem Relation Age of Onset   Diabetes  Mother    Colon cancer Mother    Stroke Father    Cancer Sister    Cancer Brother    Heart attack Brother    Cancer Brother    Heart attack Brother     ALLERGIES:  is allergic to doxycycline, codeine, and hydrocodone.  MEDICATIONS:  Current Outpatient Medications  Medication Sig Dispense Refill   benazepril (LOTENSIN) 5 MG tablet Take 5 mg by mouth daily.     calcium carbonate (TUMS - DOSED IN MG ELEMENTAL CALCIUM) 500 MG chewable tablet Chew 1 tablet by mouth daily.     Cholecalciferol (VITAMIN D3) 50 MCG (2000 UT) capsule Take 200 Units by mouth daily.     cyanocobalamin (,VITAMIN B-12,) 1000 MCG/ML injection Inject 1,000 mcg into the muscle every 30 (thirty) days.     traMADol (ULTRAM) 50 MG tablet Take 1-2 tablets (50-100 mg total) by mouth every 6 (six) hours as needed for moderate pain or severe pain. 30 tablet 0   traMADol (ULTRAM) 50 MG tablet Take 1 tablet (50 mg total) by mouth every 6 (six) hours as needed (mild pain). 30 tablet 0   No current facility-administered medications for this visit.    PHYSICAL EXAMINATION: ECOG PERFORMANCE STATUS: 0 - Asymptomatic  Vitals:   02/14/23 1423  BP: (!) 150/62  Pulse: 65  Resp: 18  Temp: 97.9 F (36.6 C)  SpO2: 99%   Filed Weights   02/14/23 1423  Weight: 138 lb 11.2 oz (62.9 kg)    GENERAL:alert, no distress and comfortable Right breast healing well.  No concern for infection.  LABORATORY DATA:  I have reviewed the data as listed Lab Results  Component Value Date   WBC 7.9 01/09/2022   HGB 10.7 (L) 01/09/2022   HCT 32.7 (L) 01/09/2022   MCV 86.5 01/09/2022   PLT 180 01/09/2022   Lab Results  Component Value Date   NA 138 01/15/2022   K 3.7 01/15/2022   CL 108 01/15/2022   CO2 21 (L) 01/15/2022    RADIOGRAPHIC STUDIES: I have personally reviewed the radiological reports and agreed with the findings in the report.  ASSESSMENT AND PLAN:  Malignant neoplasm of upper-outer quadrant of right breast in  female, estrogen receptor positive (HCC) This is a very pleasant 82 year old female patient with newly diagnosed right breast invasive ductal carcinoma detected on a screening mammogram status post lumpectomy with final pathology showing a grade 1, 1.3 cm IDC, DCIS, all margins negative, prognostic showing ER 100% strong staining PR 100% strong staining, HER2 negative, Ki-67 of 5% referred to medical oncology for additional recommendations.  We have reviewed the pathology results in detail.  Given her age and frailty, we have discussed about Oncotype but we have agreed to not proceed with it.  Also given her low-grade, favorable prognostics, I do not believe she will have high Oncotype to warrant any  adjuvant chemotherapy.  I have hence recommended to proceed with adjuvant radiation if this is an option followed by antiestrogen therapy.  We have discussed options for antiestrogen therapy today in detail.  We have reviewed mechanism of action of tamoxifen versus aromatase centimeters, adverse effects with each class.  At this time she is leaning towards aromatase inhibitors.  We have reviewed the side effects from aromatase inhibitors including but not limited to postmenopausal symptoms such as hot flashes, vaginal dryness, arthralgias, bone density changes, increased risk of cardiovascular events.  She has osteopenia at baseline.  I will communicate with the radiation oncology team to please keep [place] posted if she is not going to proceed with radiation so we can initiate antiestrogen therapy sooner.   All questions were answered. The patient knows to call the clinic with any problems, questions or concerns.    Rachel Moulds, MD 02/15/23

## 2023-02-15 NOTE — Assessment & Plan Note (Signed)
This is a very pleasant 82 year old female patient with newly diagnosed right breast invasive ductal carcinoma detected on a screening mammogram status post lumpectomy with final pathology showing a grade 1, 1.3 cm IDC, DCIS, all margins negative, prognostic showing ER 100% strong staining PR 100% strong staining, HER2 negative, Ki-67 of 5% referred to medical oncology for additional recommendations.  We have reviewed the pathology results in detail.  Given her age and frailty, we have discussed about Oncotype but we have agreed to not proceed with it.  Also given her low-grade, favorable prognostics, I do not believe she will have high Oncotype to warrant any adjuvant chemotherapy.  I have hence recommended to proceed with adjuvant radiation if this is an option followed by antiestrogen therapy.  We have discussed options for antiestrogen therapy today in detail.  We have reviewed mechanism of action of tamoxifen versus aromatase centimeters, adverse effects with each class.  At this time she is leaning towards aromatase inhibitors.  We have reviewed the side effects from aromatase inhibitors including but not limited to postmenopausal symptoms such as hot flashes, vaginal dryness, arthralgias, bone density changes, increased risk of cardiovascular events.  She has osteopenia at baseline.  I will communicate with the radiation oncology team to please keep [place] posted if she is not going to proceed with radiation so we can initiate antiestrogen therapy sooner.

## 2023-02-18 ENCOUNTER — Encounter: Payer: Self-pay | Admitting: Radiation Oncology

## 2023-02-18 ENCOUNTER — Encounter: Payer: Self-pay | Admitting: *Deleted

## 2023-02-18 ENCOUNTER — Telehealth: Payer: Self-pay

## 2023-02-18 NOTE — Telephone Encounter (Signed)
RN called family for meaningful use and nurse evaluation information. Note completed and routed to Dr. Basilio Cairo and Quitman Livings PA-C for review.

## 2023-02-18 NOTE — Progress Notes (Signed)
Radiation Oncology         [phone] ________________________________  Initial Outpatient Consultation  Name: Jasmine Cantu MRN: [id number]  Date: 02/19/2023  DOB: 1940/12/03  CC:Jasmine Montana, MD  Almond Lint, MD   REFERRING PHYSICIAN: Almond Lint, MD  DIAGNOSIS: [phone]   ICD-10-CM   1. Malignant neoplasm of upper-outer quadrant of right breast in female, estrogen receptor positive (HCC)  C50.411    Z17.0     2. Ductal carcinoma in situ (DCIS) of right breast  D05.11        Cancer Staging  Malignant neoplasm of upper-outer quadrant of right breast in female, estrogen receptor positive (HCC) Staging form: Breast, AJCC 8th Edition - Pathologic stage from 01/28/2023: Stage IA (pT1c, pN0, cM0, G1, ER+, PR+, HER2-) - Unsigned Stage prefix: Initial diagnosis Method of lymph node assessment: Clinical Multigene prognostic tests performed: None Histologic grading system: 3 grade system   Stage 1A Right Breast UOQ, Invasive ductal carcinoma with intermediate grade DCIS, ER+ / PR+ / Her2-, Grade 1: s/p lumpectomy (without SLN evaluation)  CHIEF COMPLAINT: Here to discuss management of right breast cancer  HISTORY OF PRESENT ILLNESS::Jasmine Cantu is a 82 y.o. female who presented with a right breast abnormality on the following imaging: bilateral screening mammogram on the date of 12/20/22. No symptoms, if any, were reported at that time. Diagnostic right breast diagnostic mammogram and right breast ultrasound on 01/04/23 further revealed an irregular mass in the 11 o'clock right breast, 2 cmfn, measuring 12 x 11 x 9 mm, with surrounding fibrocystic changes. No evidence of right axillary lymphadenopathy was appreciated.   Biopsy of the 11 o'clock right breast on date of 01/10/23 showed grade 1 invasive ductal carcinoma measuring 9 mm in the greatest linear extent of the sample, with focal intermediate grade DCIS.  ER status: 100% positive and PR status 100% positive, both with strong  staining intensity; Proliferation marker Ki67 at 5%; Her2 status negative; Grade 1. No lymph nodes were examined.   Subsequently, the patient was referred to general surgery and opted to proceed with a right breast lumpectomy without nodal biopsies on 01/28/23 under the care of Dr. Donell Beers. Pathology from the procedure revealed: tumor the size of 1.3 cm; histology of grade 1 invasive ductal carcinoma with intermediate grade DCIS; all margins negative for invasive and in situ carcinoma; margin status to invasive disease of 2 mm from the posterior margin; margin status to in situ disease 5 mm from the anterior margin; no lymph nodes were examined.   She was recently seen by Dr. Al Pimple in consultation on 02/14/23 and she is interested in proceeding with AI at this time. She will return to Dr. Al Pimple following XRT to initiate antihormonal treatment.   Patient is present with her supportive daughter today. She has some residual discomfort from the surgery, and has been wearing an ice pack in her bra. She has good range of motion in her right arm and denies any other breast specific issues. She lives alone, but her son lives across the street and she is in close contact with her family often.   PREVIOUS RADIATION THERAPY: No  PAST MEDICAL HISTORY:  has a past medical history of Allergy, Anemia, Cataract, Collagenous colitis (2004), Diverticulosis, Esophageal stricture (1988), GERD (gastroesophageal reflux disease), H. pylori infection, Hiatal hernia, Hypertension, IBS (irritable bowel syndrome), Iron deficiency anemia, Tubular adenoma of colon, Ulcer, and Vitamin B12 deficiency.    PAST SURGICAL HISTORY: Past Surgical History:  Procedure Laterality Date   APPENDECTOMY  BLADDER SUSPENSION     BREAST BIOPSY Right 01/10/2023   [place] RT BREAST BX W LOC DEV 1ST LESION IMG BX SPEC US GUIDE 01/10/2023 [facility]   BREAST BIOPSY  01/25/2023   [place] RT RADIOACTIVE SEED LOC 01/25/2023 [facility]   BREAST  LUMPECTOMY WITH RADIOACTIVE SEED LOCALIZATION Right 01/28/2023   Procedure: RIGHT BREAST LUMPECTOMY WITH RADIOACTIVE SEED LOCALIZATION;  Surgeon: Almond Lint, MD;  Location: [facility];  Service: General;  Laterality: Right;   CHOLECYSTECTOMY     EYE SURGERY     LAPAROTOMY N/A 01/08/2022   Procedure: EXPLORATORY LAPAROTOMY, SMALL BOWEL RESECTION;  Surgeon: Emelia Loron, MD;  Location: [facility];  Service: General;  Laterality: N/A;   VAGINAL HYSTERECTOMY      FAMILY HISTORY: family history includes Cancer in her brother, brother, and sister; Colon cancer in her mother; Diabetes in her mother; Heart attack in her brother and brother; Stroke in her father.  SOCIAL HISTORY:  reports that she has never smoked. She has never used smokeless tobacco. She reports that she does not drink alcohol and does not use drugs.  ALLERGIES: Doxycycline, Codeine, and Hydrocodone  MEDICATIONS:  Current Outpatient Medications  Medication Sig Dispense Refill   benazepril (LOTENSIN) 5 MG tablet Take 10 mg by mouth daily.     calcium carbonate (TUMS - DOSED IN MG ELEMENTAL CALCIUM) 500 MG chewable tablet Chew 1 tablet by mouth daily.     Cholecalciferol (VITAMIN D3) 50 MCG (2000 UT) capsule Take 200 Units by mouth daily.     cyanocobalamin (,VITAMIN B-12,) 1000 MCG/ML injection Inject 1,000 mcg into the muscle every 30 (thirty) days.     traMADol (ULTRAM) 50 MG tablet Take 1-2 tablets (50-100 mg total) by mouth every 6 (six) hours as needed for moderate pain or severe pain. 30 tablet 0   traMADol (ULTRAM) 50 MG tablet Take 1 tablet (50 mg total) by mouth every 6 (six) hours as needed (mild pain). 30 tablet 0   No current facility-administered medications for this encounter.    REVIEW OF SYSTEMS: As above in HPI.   PHYSICAL EXAM:  height is 5\' 3"  (1.6 m) and weight is 139 lb 12.8 oz (63.4 kg). Her temperature is 97.8 F (36.6 C). Her blood pressure is 152/45 (abnormal) and her pulse is 62. Her  respiration is 20 and oxygen saturation is 98%.   General: Alert and oriented, in no acute distress HEENT: Head is normocephalic. Extraocular movements are intact. Oropharynx is clear. Neck: Neck is supple, no palpable cervical, axilla, or supraclavicular lymphadenopathy. Heart: Regular in rate and rhythm with no murmurs, rubs, or gallops. Chest: Clear to auscultation bilaterally, with no rhonchi, wheezes, or rales. Abdomen: Soft, nontender, nondistended, with no rigidity or guarding. Extremities: No cyanosis or edema. Lymphatics: see Neck Exam Skin: No concerning lesions. Musculoskeletal: symmetric strength and muscle tone throughout. Neurologic: Cranial nerves II through XII are grossly intact. No obvious focalities. Speech is fluent. Coordination is intact. Psychiatric: Judgment and insight are intact. Affect is appropriate. Breasts: Seroma at the lumpectomy site on the right breast. Scar tissue palpated beneath with no signs of infection. No other palpable masses appreciated in the right breast or axilla.    ECOG = 1  0 - Asymptomatic (Fully active, able to carry on all predisease activities without restriction)  1 - Symptomatic but completely ambulatory (Restricted in physically strenuous activity but ambulatory and able to carry out work of a light or sedentary nature. For example, light housework,  office work)  2 - Symptomatic, <50% in bed during the day (Ambulatory and capable of all self care but unable to carry out any work activities. Up and about more than 50% of waking hours)  3 - Symptomatic, >50% in bed, but not bedbound (Capable of only limited self-care, confined to bed or chair 50% or more of waking hours)  4 - Bedbound (Completely disabled. Cannot carry on any self-care. Totally confined to bed or chair)  5 - Death   Santiago Glad MM, Creech RH, Tormey DC, et al. [phone]). "Toxicity and response criteria of the [facility]". Am. Evlyn Clines. Oncol. 5 (6):  649-55   LABORATORY DATA:  Lab Results  Component Value Date   WBC 7.9 01/09/2022   HGB 10.7 (L) 01/09/2022   HCT 32.7 (L) 01/09/2022   MCV 86.5 01/09/2022   PLT 180 01/09/2022   CMP     Component Value Date/Time   NA 138 01/15/2022 0440   K 3.7 01/15/2022 0440   CL 108 01/15/2022 0440   CO2 21 (L) 01/15/2022 0440   GLUCOSE 105 (H) 01/15/2022 0440   BUN 10 01/15/2022 0440   CREATININE 0.85 01/15/2022 0440   CALCIUM 8.1 (L) 01/15/2022 0440   PROT 6.7 01/08/2022 0952   ALBUMIN 3.6 01/08/2022 0952   AST 13 (L) 01/08/2022 0952   ALT 8 01/08/2022 0952   ALKPHOS 105 01/08/2022 0952   BILITOT 0.9 01/08/2022 0952   GFRNONAA >60 01/15/2022 0440         RADIOGRAPHY: MM Breast Surgical Specimen  Result Date: 01/28/2023 CLINICAL DATA:  Specimen radiograph. EXAM: SPECIMEN RADIOGRAPH OF THE RIGHT BREAST COMPARISON:  Previous exam(s). FINDINGS: Status post excision of the right breast. The radioactive seed and biopsy marker clip are present, completely intact, and were marked for pathology. IMPRESSION: Specimen radiograph of the right breast. Electronically Signed   By: Baird Lyons M.D.   On: 01/28/2023 08:20  [place] RT RADIOACTIVE SEED LOC  Result Date: 01/25/2023 CLINICAL DATA:  82 year old with biopsy-proven invasive ductal carcinoma and DCIS involving the UPPER OUTER QUADRANT of the RIGHT breast. Radioactive seed localization is performed in anticipation of lumpectomy which is scheduled for 01/28/2023. EXAM: ULTRASOUND GUIDED RADIOACTIVE SEED LOCALIZATION OF THE RIGHT BREAST DIAGNOSTIC RIGHT MAMMOGRAM POST RADIOACTIVE SEED PLACEMENT COMPARISON:  Previous exam(s). FINDINGS: Patient presents for radioactive seed localization prior to RIGHT breast lumpectomy. I met with the patient and we discussed the procedure of seed localization including benefits and alternatives. We discussed the high likelihood of a successful procedure. We discussed the risks of the procedure including infection,  bleeding, tissue injury and further surgery. We discussed the low dose of radioactivity involved in the procedure. Informed, written consent was given. The usual time-out protocol was performed immediately prior to the procedure. Using ultrasound guidance, sterile technique with chlorhexidine as skin antisepsis, 1% lidocaine as local anesthetic, an I-125 radioactive seed was used to localize the mass in the UPPER OUTER QUADRANT using a lateral approach. The follow-up 2D full field CC and mediolateral mammogram images confirm that the seed is in the expected location at the lateral margin of the mass, immediately adjacent to the coil shaped tissue marking clip. The images are marked for Dr. Donell Beers. Follow-up survey of the patient confirms the presence of the radioactive seed. Order number of I-125 seed: [id number] Total activity:  0.236 mCi Reference Date: 12/18/2022 The patient tolerated the procedure well and was released from the [facility]. She was given instructions regarding seed removal.  IMPRESSION: Radioactive seed localization of biopsy-proven invasive ductal carcinoma and DCIS involving the UPPER OUTER QUADRANT of the RIGHT breast. Electronically Signed   By: Hulan Saas M.D.   On: 01/25/2023 15:14  MM CLIP PLACEMENT RIGHT  Result Date: 01/25/2023 CLINICAL DATA:  82 year old with biopsy-proven invasive ductal carcinoma and DCIS involving the UPPER OUTER QUADRANT of the RIGHT breast. Radioactive seed localization is performed in anticipation of lumpectomy which is scheduled for 01/28/2023. EXAM: ULTRASOUND GUIDED RADIOACTIVE SEED LOCALIZATION OF THE RIGHT BREAST DIAGNOSTIC RIGHT MAMMOGRAM POST RADIOACTIVE SEED PLACEMENT COMPARISON:  Previous exam(s). FINDINGS: Patient presents for radioactive seed localization prior to RIGHT breast lumpectomy. I met with the patient and we discussed the procedure of seed localization including benefits and alternatives. We discussed the high likelihood of a  successful procedure. We discussed the risks of the procedure including infection, bleeding, tissue injury and further surgery. We discussed the low dose of radioactivity involved in the procedure. Informed, written consent was given. The usual time-out protocol was performed immediately prior to the procedure. Using ultrasound guidance, sterile technique with chlorhexidine as skin antisepsis, 1% lidocaine as local anesthetic, an I-125 radioactive seed was used to localize the mass in the UPPER OUTER QUADRANT using a lateral approach. The follow-up 2D full field CC and mediolateral mammogram images confirm that the seed is in the expected location at the lateral margin of the mass, immediately adjacent to the coil shaped tissue marking clip. The images are marked for Dr. Donell Beers. Follow-up survey of the patient confirms the presence of the radioactive seed. Order number of I-125 seed: [id number] Total activity:  0.236 mCi Reference Date: 12/18/2022 The patient tolerated the procedure well and was released from the [facility] Center. She was given instructions regarding seed removal. IMPRESSION: Radioactive seed localization of biopsy-proven invasive ductal carcinoma and DCIS involving the UPPER OUTER QUADRANT of the RIGHT breast. Electronically Signed   By: Hulan Saas M.D.   On: 01/25/2023 15:14     IMPRESSION/PLAN: Stage 1A Right Breast UOQ, Invasive ductal carcinoma with intermediate grade DCIS, ER+ / PR+ / Her2-, Grade 1: s/p lumpectomy (without SLN evaluation)   For the patient's early stage favorable risk breast cancer, we had a thorough discussion about her options for adjuvant therapy. One option would be antiestrogen therapy as discussed with medical oncology. She would take a pill for approximately 5 years. The more aggressive option would be to pursue both RT and antiestrogen modalities.   Of note, I discussed the data from the [employer] al trial in the [place] Journal of Medicine. She  understands that tamoxifen compared to radiation plus tamoxifen demonstrated no survival benefit among the women in this study. The women were 70 years or older with stage I estrogen receptor positive breast cancer. Based on this study, I told the patient that her overall life expectancy should not be affected by adding radiotherapy to antiestrogen medication. She understands that the main benefit of  adding radiotherapy to anti estrogen therapy would be a very small but measurable local control benefit (risk of local recurrence to be lowered from ~9% --> ~2% over a decade).  We discussed the fact that radiotherapy only provides a local control benefit while anti-estrogen pills provide systemic coverage. That being said, the risk of systemic failure is relatively low with her type of breast cancer.   We discussed the risks benefits and side effects of radiotherapy. She understands that the side effects would likely include some skin irritation and fatigue during the  weeks of radiation. There is a risk of late effects which include but are not necessarily limited to cosmetic changes and rare lung toxicity. I would anticipate delivering approximately 1-3 weeks of radiotherapy    After a thorough discussion of standard hypofractionation over 3-4 weeks, we discussed 1 week of ultra hypofractionated radiation therapy. I discussed that this approach is a bit less standard than longer regimens.  The Fast Forward trial (1 week) method has shown a slight increase in normal tissue side effects over the 3 week hypofractionation standard at 5 years of follow-up. This includes effects like induration, and edema.  However, for elderly patients that are keen on the convenience of minimizing the number of procedures/visits to the cancer center, this is a nevertheless a reasonable approach to consider and the vast majority of patients do very well. There is also an option of RT once weekly for 5 weeks that we discussed.    After our discussion today, she would like to proceed with antihormone therapy alone. We will share this Dr. Al Pimple so patient can get started with therapy. It was a pleasure meeting this patient today, she knows to call back with any questions.     On date of service, in total, I spent 60 minutes on this encounter. Patient was seen in person.   __________________________________________   Joyice Faster, PA-C    Lonie Peak, MD  This document serves as a record of services personally performed by Lonie Peak, MD. It was created on her behalf by Neena Rhymes, a trained medical scribe. The creation of this record is based on the scribe's personal observations and the provider's statements to them. This document has been checked and approved by the attending provider.

## 2023-02-19 ENCOUNTER — Other Ambulatory Visit: Payer: Self-pay

## 2023-02-19 ENCOUNTER — Ambulatory Visit: Admission: RE | Admit: 2023-02-19 | Payer: Medicare Other | Source: Ambulatory Visit | Admitting: Radiation Oncology

## 2023-02-19 ENCOUNTER — Telehealth: Payer: Self-pay | Admitting: Hematology and Oncology

## 2023-02-19 ENCOUNTER — Ambulatory Visit
Admission: RE | Admit: 2023-02-19 | Discharge: 2023-02-19 | Disposition: A | Discharge status: Home / Self Care | Payer: Medicare Other | Source: Ambulatory Visit | Attending: Radiation Oncology | Admitting: Radiation Oncology

## 2023-02-19 ENCOUNTER — Telehealth: Payer: Self-pay | Admitting: *Deleted

## 2023-02-19 VITALS — BP 152/45 | HR 62 | Temp 97.8°F | Resp 20 | Ht 63.0 in | Wt 139.8 lb

## 2023-02-19 DIAGNOSIS — E538 Deficiency of other specified B group vitamins: Secondary | ICD-10-CM | POA: Insufficient documentation

## 2023-02-19 DIAGNOSIS — K52831 Collagenous colitis: Secondary | ICD-10-CM | POA: Diagnosis not present

## 2023-02-19 DIAGNOSIS — Z90710 Acquired absence of both cervix and uterus: Secondary | ICD-10-CM | POA: Insufficient documentation

## 2023-02-19 DIAGNOSIS — Z86010 Personal history of colonic polyps: Secondary | ICD-10-CM | POA: Diagnosis not present

## 2023-02-19 DIAGNOSIS — C50411 Malignant neoplasm of upper-outer quadrant of right female breast: Secondary | ICD-10-CM | POA: Diagnosis present

## 2023-02-19 DIAGNOSIS — Z800 Family history of malignant neoplasm of digestive organs: Secondary | ICD-10-CM | POA: Insufficient documentation

## 2023-02-19 DIAGNOSIS — I10 Essential (primary) hypertension: Secondary | ICD-10-CM | POA: Insufficient documentation

## 2023-02-19 DIAGNOSIS — D0511 Intraductal carcinoma in situ of right breast: Secondary | ICD-10-CM

## 2023-02-19 DIAGNOSIS — Z170 Estrogen receptor positive status [ER+]: Secondary | ICD-10-CM | POA: Insufficient documentation

## 2023-02-19 DIAGNOSIS — K449 Diaphragmatic hernia without obstruction or gangrene: Secondary | ICD-10-CM | POA: Diagnosis not present

## 2023-02-19 DIAGNOSIS — D509 Iron deficiency anemia, unspecified: Secondary | ICD-10-CM | POA: Insufficient documentation

## 2023-02-19 DIAGNOSIS — Z79899 Other long term (current) drug therapy: Secondary | ICD-10-CM | POA: Diagnosis not present

## 2023-02-19 DIAGNOSIS — K219 Gastro-esophageal reflux disease without esophagitis: Secondary | ICD-10-CM | POA: Insufficient documentation

## 2023-02-19 DIAGNOSIS — Z809 Family history of malignant neoplasm, unspecified: Secondary | ICD-10-CM | POA: Diagnosis not present

## 2023-02-19 NOTE — Telephone Encounter (Signed)
Patient and patient's daughter are aware of upcoming appointment times and dates

## 2023-02-19 NOTE — Telephone Encounter (Signed)
This RN was contacted by [facility] [employer] [facility] [employer] stating pt will not proceed with radiation per consult today with Dr Karoline Caldwell.  This note will be forwarded to  covering provider due to need to start anti estrogen therapy - pt had lumpectomy on 01/28/2023.

## 2023-02-25 ENCOUNTER — Encounter: Payer: Self-pay | Admitting: *Deleted

## 2023-02-25 DIAGNOSIS — Z170 Estrogen receptor positive status [ER+]: Secondary | ICD-10-CM

## 2023-03-07 ENCOUNTER — Other Ambulatory Visit: Payer: Self-pay

## 2023-03-07 ENCOUNTER — Encounter: Payer: Self-pay | Admitting: Adult Health

## 2023-03-07 ENCOUNTER — Inpatient Hospital Stay: Payer: Medicare Other | Attending: Hematology and Oncology | Admitting: Adult Health

## 2023-03-07 VITALS — BP 142/43 | HR 55 | Temp 97.4°F | Resp 18 | Ht 63.0 in | Wt 138.8 lb

## 2023-03-07 DIAGNOSIS — C50411 Malignant neoplasm of upper-outer quadrant of right female breast: Secondary | ICD-10-CM | POA: Insufficient documentation

## 2023-03-07 DIAGNOSIS — Z809 Family history of malignant neoplasm, unspecified: Secondary | ICD-10-CM | POA: Diagnosis not present

## 2023-03-07 DIAGNOSIS — Z170 Estrogen receptor positive status [ER+]: Secondary | ICD-10-CM | POA: Diagnosis not present

## 2023-03-07 DIAGNOSIS — M8580 Other specified disorders of bone density and structure, unspecified site: Secondary | ICD-10-CM | POA: Diagnosis not present

## 2023-03-07 DIAGNOSIS — Z800 Family history of malignant neoplasm of digestive organs: Secondary | ICD-10-CM | POA: Diagnosis not present

## 2023-03-07 MED ORDER — TAMOXIFEN CITRATE 20 MG PO TABS: 20.0000 mg | ORAL_TABLET | Freq: Every day | ORAL | 3 refills | Status: DC

## 2023-03-07 NOTE — Patient Instructions (Signed)
 Tamoxifen Solution What is this medication? TAMOXIFEN (ta MOX i fen) prevents and treats breast cancer. It works by blocking the hormone estrogen in breast tissue, which prevents breast cancer cells from spreading or growing. This medicine may be used for other purposes; ask your health care provider or pharmacist if you have questions. COMMON BRAND NAME(S): Soltamox What should I tell my care team before I take this medication? They need to know if you have any of these conditions: Blood clots Endometriosis High cholesterol Irregular menstrual cycles Liver disease Stroke Uterine cancer Uterine fibroids An unusual reaction to tamoxifen, other medications, foods, dyes, or preservatives Pregnant or trying to get pregnant Breast-feeding How should I use this medication? Take this medication by mouth. Take it as directed on the prescription label at the same time every day. You can take it with or without food. If it upsets your stomach, take it with food. Keep taking it unless your care team tells you to stop. A special MedGuide will be given to you by the pharmacist with each prescription and refill. Be sure to read this information carefully each time. Talk to your care team about the use of this medication in children. Special care may be needed. Overdosage: If you think you have taken too much of this medicine contact a poison control center or emergency room at once. NOTE: This medicine is only for you. Do not share this medicine with others. What if I miss a dose? If you miss a dose, take it as soon as you can. If it is almost time for your next dose, take only that dose. Do not take double or extra doses. What may interact with this medication? Do not take this medication with any of the following: Cisapride Dronedarone Pimozide Thioridazine This medication may also interact with the following: Anastrozole Certain medications for seizures, such as carbamazepine, phenobarbital,  phenytoin Letrozole Other medications that cause heart rhythm changes Paroxetine Rifampin Warfarin This list may not describe all possible interactions. Give your health care provider a list of all the medicines, herbs, non-prescription drugs, or dietary supplements you use. Also tell them if you smoke, drink alcohol, or use illegal drugs. Some items may interact with your medicine. What should I watch for while using this medication? Visit your care team for regular checks on your progress. You will need regular pelvic exams, breast exams, and mammograms. Talk to your care team about your risk for uterine cancer. You may be more at risk for uterine cancer if you take this medication. Talk to your care team if you may be pregnant. Serious birth defect can occur if you take this medication during pregnancy and for 2 months after the last dose. You will need a negative pregnancy test before starting this medication. Contraception is recommended while taking this medication and for 2 months after the last dose. Your care team can help you find the option that works for you. Do not breastfeed while taking this medication and for 3 months the last dose, This medication may cause infertility. Talk to your care team if you are concerned about your fertility. What side effects may I notice from receiving this medication? Side effects that you should report to your care team as soon as possible: Allergic reactions--skin rash, itching, hives, swelling of the face, lips, tongue, or throat Blood clot--pain, swelling, or warmth in the leg, shortness of breath, chest pain High calcium level--increased thirst or amount of urine, nausea, vomiting, confusion, unusual weakness or fatigue, bone  pain Infection--fever, chills, cough, or sore throat Irregular menstrual cycles or spotting Liver injury--right upper belly pain, loss of appetite, nausea, light-colored stool, dark yellow or brown urine, yellowing skin or  eyes, unusual weakness or fatigue Low red blood cell count--unusual weakness or fatigue, dizziness, headache, trouble breathing Stroke--sudden numbness or weakness in the face, arm, or leg, trouble speaking, confusion, trouble walking, loss of balance or coordination, dizziness, severe headache, change in vision Unusual bruising or bleeding Vaginal bleeding after menopause, pelvic pain Side effects that usually do not require medical attention (report to your care team if they continue or are bothersome): Hot flashes Mood swings Vaginal discharge This list may not describe all possible side effects. Call your doctor for medical advice about side effects. You may report side effects to FDA at 1-800-FDA-1088. Where should I keep my medication? Keep out of the reach of children and pets. Store at room temperature between 20 and 25 degrees C (68 and 77 degrees F). Do not freeze or refrigerate. Keep this medication in the original container. Protect from light. Get rid of any unused medication 3 months after opening. To get rid of medicines that are no longer needed or have expired: Take the medication to a medication take-back program. Check with your pharmacy or law enforcement to find a location. If you cannot return the medication, check the label or package insert to see if the medication should be thrown out in the garbage or flushed down the toilet. If you are not sure, ask your care team. If it is safe to put in the trash, pour the medication out of the container. Mix the medication with cat litter, dirt, coffee grounds, or other unwanted substance. Seal the mixture in a bag or container. Put it in the trash. NOTE: This sheet is a summary. It may not cover all possible information. If you have questions about this medicine, talk to your doctor, pharmacist, or health care provider.  2024 Elsevier/Gold Standard (2022-12-12 00:00:00)

## 2023-03-07 NOTE — Progress Notes (Signed)
[facility] Cancer Follow up:    Jasmine Montana, MD [phone] [address] [place] [address]   DIAGNOSIS:  Cancer Staging  Malignant neoplasm of upper-outer quadrant of right breast in female, estrogen receptor positive (HCC) Staging form: Breast, AJCC 8th Edition - Pathologic stage from 01/28/2023: Stage IA (pT1c, pN0, cM0, G1, ER+, PR+, HER2-) - Unsigned Stage prefix: Initial diagnosis Method of lymph node assessment: Clinical Multigene prognostic tests performed: None Histologic grading system: 3 grade system   SUMMARY OF ONCOLOGIC HISTORY: Oncology History  Malignant neoplasm of upper-outer quadrant of right breast in female, estrogen receptor positive (HCC)  12/20/2022 Mammogram   Possible mass in the right breast noted on screening mammogram.Highly suspicious irregular mass in the right breast at 11 o'clock. No axillary adenopathy. Surrounding fibrocystic changes. Recommend ultrasound-guided biopsy of the 11 o'clock right breast mass.   01/10/2023 Pathology Results   Right breast needle core biopsy at 11 0 clock 2 cm from nipple show grade 1 IDC, focal DCIS, cribriform type. ER pos 100% strong staining, PR positive 100% strong staining intensity Ki 67 5%   01/28/2023 Surgery   Right breast lumpectomy: IDC, 1.3 cm, grade 1, margins negative, 0 SLN biopsied     CURRENT THERAPY: s/p surgery  INTERVAL HISTORY: Jasmine Cantu 82 y.o. female returns for follow-up accompanied by her daughter after undergoing right breast lumpectomy on January 28, 2023 that demonstrated a 1.3 cm invasive ductal carcinoma that was ER and PR positive.  She met with Dr. Basilio Cairo who discussed the risks and benefits of adjuvant radiation and the patient opted to forego adjuvant radiation after learning of the minimal benefits.  She is struggled with postoperative seromas and has undergone several aspirations of these in the past several weeks.  She feels like her breast is healing well and  denies any other issues.   Patient Active Problem List   Diagnosis Date Noted   Malignant neoplasm of upper-outer quadrant of right breast in female, estrogen receptor positive (HCC) 02/12/2023   Small bowel ischemia (HCC) 01/08/2022   Perforated bowel (HCC) 01/08/2022   Reflux 05/31/2012    is allergic to doxycycline, codeine, and hydrocodone.  MEDICAL HISTORY: Past Medical History:  Diagnosis Date   Allergy    Anemia    Cataract    Collagenous colitis 2004   Diverticulosis    Esophageal stricture 1988   GERD (gastroesophageal reflux disease)    H. pylori infection    Hiatal hernia    Hypertension    IBS (irritable bowel syndrome)    Iron deficiency anemia    Tubular adenoma of colon    Ulcer    Vitamin B12 deficiency     SURGICAL HISTORY: Past Surgical History:  Procedure Laterality Date   APPENDECTOMY     BLADDER SUSPENSION     BREAST BIOPSY Right 01/10/2023   [place] RT BREAST BX W LOC DEV 1ST LESION IMG BX SPEC US GUIDE 01/10/2023 [facility]   BREAST BIOPSY  01/25/2023   [place] RT RADIOACTIVE SEED LOC 01/25/2023 [facility]   BREAST LUMPECTOMY WITH RADIOACTIVE SEED LOCALIZATION Right 01/28/2023   Procedure: RIGHT BREAST LUMPECTOMY WITH RADIOACTIVE SEED LOCALIZATION;  Surgeon: Almond Lint, MD;  Location: [facility];  Service: General;  Laterality: Right;   CHOLECYSTECTOMY     EYE SURGERY     LAPAROTOMY N/A 01/08/2022   Procedure: EXPLORATORY LAPAROTOMY, SMALL BOWEL RESECTION;  Surgeon: Emelia Loron, MD;  Location: [facility];  Service: General;  Laterality: N/A;   VAGINAL HYSTERECTOMY      SOCIAL HISTORY: Social History   Socioeconomic History   Marital status: Single    Spouse name: Not on file   Number of children: Not on file   Years of education: Not on file   Highest education level: Not on file  Occupational History   Not on file  Tobacco Use   Smoking status: Never   Smokeless tobacco: Never  Vaping Use   Vaping status:  Never Used  Substance and Sexual Activity   Alcohol use: No   Drug use: No   Sexual activity: Yes    Birth control/protection: Surgical  Other Topics Concern   Not on file  Social History Narrative   Not on file   Social Determinants of Health   Financial Resource Strain: Not on file  Food Insecurity: No Food Insecurity (02/18/2023)   Hunger Vital Sign    Worried About Running Out of Food in the Last Year: Never true    Ran Out of Food in the Last Year: Never true  Transportation Needs: No Transportation Needs (02/18/2023)   PRAPARE - Administrator, Civil Service (Medical): No    Lack of Transportation (Non-Medical): No  Physical Activity: Not on file  Stress: Not on file  Social Connections: Not on file  Intimate Partner Violence: Not At Risk (02/18/2023)   Humiliation, Afraid, Rape, and Kick questionnaire    Fear of Current or Ex-Partner: No    Emotionally Abused: No    Physically Abused: No    Sexually Abused: No    FAMILY HISTORY: Family History  Problem Relation Age of Onset   Diabetes Mother    Colon cancer Mother    Stroke Father    Cancer Sister    Cancer Brother    Heart attack Brother    Cancer Brother    Heart attack Brother     Review of Systems  Constitutional:  Negative for appetite change, chills, fatigue, fever and unexpected weight change.  HENT:   Negative for hearing loss, lump/mass and trouble swallowing.   Eyes:  Negative for eye problems and icterus.  Respiratory:  Negative for chest tightness, cough and shortness of breath.   Cardiovascular:  Negative for chest pain, leg swelling and palpitations.  Gastrointestinal:  Negative for abdominal distention, abdominal pain, constipation, diarrhea, nausea and vomiting.  Endocrine: Negative for hot flashes.  Genitourinary:  Negative for difficulty urinating.   Musculoskeletal:  Negative for arthralgias.  Skin:  Negative for itching and rash.  Neurological:  Negative for dizziness,  extremity weakness, headaches and numbness.  Hematological:  Negative for adenopathy. Does not bruise/bleed easily.  Psychiatric/Behavioral:  Negative for depression. The patient is not nervous/anxious.       PHYSICAL EXAMINATION   Onc Performance Status - 03/07/23 0841       ECOG Perf Status   ECOG Perf Status Ambulatory and capable of all selfcare but unable to carry out any work activities.  Up and about more than 50% of waking hours      KPS SCALE   KPS % SCORE Normal activity with effort, some s/s of disease             Vitals:   03/07/23 0837  BP: (!) 142/43  Pulse: (!) 55  Resp: 18  Temp: (!) 97.4 F (36.3 C)  SpO2: 100%    Physical Exam Constitutional:      General: She is not in acute distress.  Appearance: Normal appearance. She is not toxic-appearing.  HENT:     Head: Normocephalic and atraumatic.     Mouth/Throat:     Mouth: Mucous membranes are moist.     Pharynx: Oropharynx is clear. No oropharyngeal exudate or posterior oropharyngeal erythema.  Eyes:     General: No scleral icterus. Cardiovascular:     Rate and Rhythm: Normal rate and regular rhythm.     Pulses: Normal pulses.     Heart sounds: Normal heart sounds.  Pulmonary:     Effort: Pulmonary effort is normal.     Breath sounds: Normal breath sounds.  Chest:     Comments: Right breast status postlumpectomy.  Small amount of swelling noted about the lumpectomy site consistent with a resolving seroma.  Is no erythema, warmth, or purulence noted in her right breast.  Her left breast is benign. Abdominal:     General: Abdomen is flat. Bowel sounds are normal. There is no distension.     Palpations: Abdomen is soft.     Tenderness: There is no abdominal tenderness.  Musculoskeletal:        General: No swelling.     Cervical back: Neck supple.  Lymphadenopathy:     Cervical: No cervical adenopathy.  Skin:    General: Skin is warm and dry.     Findings: No rash.  Neurological:      General: No focal deficit present.     Mental Status: She is alert.  Psychiatric:        Mood and Affect: Mood normal.        Behavior: Behavior normal.       ASSESSMENT and THERAPY PLAN:   Malignant neoplasm of upper-outer quadrant of right breast in female, estrogen receptor positive (HCC) Jasmine Cantu is an 82 year old woman with stage Ia right breast ER/PR positive invasive ductal carcinoma diagnosed in June 2024 status postlumpectomy here today to start antiestrogen therapy.  We reviewed her 2 antiestrogen therapy options which include anastrozole and tamoxifen.  We reviewed her recent bone density from May 2024 that demonstrated osteopenia with a T-score -2.2 in the right femur.  After discussing the risks and benefits of each the patient has opted to proceed with tamoxifen 20 mg daily.  She understands potential side effects and knows to call if she has any significant side effects.  We will see her back in 3 months for a tox check and survivorship care plan visit.  She will see Dr. Al Pimple in 6 months.  All questions were answered. The patient knows to call the clinic with any problems, questions or concerns. We can certainly see the patient much sooner if necessary.  Total encounter time:30 minutes*in face-to-face visit time, chart review, lab review, care coordination, order entry, and documentation of the encounter time.    Lillard Anes, NP 03/07/23 9:46 AM Medical Oncology and Hematology [facility] [address] [place], [place] [address] Tel. [phone]    Fax. [phone]  *Total Encounter Time as defined by the Centers for Medicare and Medicaid Services includes, in addition to the face-to-face time of a patient visit (documented in the note above) non-face-to-face time: obtaining and reviewing outside history, ordering and reviewing medications, tests or procedures, care coordination (communications with other health care professionals or caregivers) and  documentation in the medical record.

## 2023-03-07 NOTE — Assessment & Plan Note (Signed)
Jasmine Cantu is an 82 year old woman with stage Ia right breast ER/PR positive invasive ductal carcinoma diagnosed in June 2024 status postlumpectomy here today to start antiestrogen therapy.  We reviewed her 2 antiestrogen therapy options which include anastrozole and tamoxifen.  We reviewed her recent bone density from May 2024 that demonstrated osteopenia with a T-score -2.2 in the right femur.  After discussing the risks and benefits of each the patient has opted to proceed with tamoxifen 20 mg daily.  She understands potential side effects and knows to call if she has any significant side effects.  We will see her back in 3 months for a tox check and survivorship care plan visit.  She will see Dr. Al Pimple in 6 months.

## 2023-03-15 ENCOUNTER — Telehealth: Payer: Self-pay | Admitting: Hematology and Oncology

## 2023-03-15 NOTE — Telephone Encounter (Signed)
Patient is aware of scheduled appointment times/dates

## 2023-04-25 ENCOUNTER — Encounter (HOSPITAL_BASED_OUTPATIENT_CLINIC_OR_DEPARTMENT_OTHER): Payer: Self-pay

## 2023-04-25 ENCOUNTER — Emergency Department (HOSPITAL_BASED_OUTPATIENT_CLINIC_OR_DEPARTMENT_OTHER): Payer: Medicare Other

## 2023-04-25 ENCOUNTER — Inpatient Hospital Stay (HOSPITAL_BASED_OUTPATIENT_CLINIC_OR_DEPARTMENT_OTHER)
Admission: EM | Admit: 2023-04-25 | Discharge: 2023-04-29 | DRG: 372 | Disposition: A | Discharge status: Home / Self Care | Payer: Medicare Other | Attending: Internal Medicine | Admitting: Internal Medicine

## 2023-04-25 ENCOUNTER — Other Ambulatory Visit: Payer: Self-pay

## 2023-04-25 DIAGNOSIS — Z8249 Family history of ischemic heart disease and other diseases of the circulatory system: Secondary | ICD-10-CM | POA: Diagnosis not present

## 2023-04-25 DIAGNOSIS — Z823 Family history of stroke: Secondary | ICD-10-CM

## 2023-04-25 DIAGNOSIS — K449 Diaphragmatic hernia without obstruction or gangrene: Secondary | ICD-10-CM | POA: Diagnosis present

## 2023-04-25 DIAGNOSIS — Z1152 Encounter for screening for COVID-19: Secondary | ICD-10-CM | POA: Diagnosis not present

## 2023-04-25 DIAGNOSIS — Z79899 Other long term (current) drug therapy: Secondary | ICD-10-CM

## 2023-04-25 DIAGNOSIS — K219 Gastro-esophageal reflux disease without esophagitis: Secondary | ICD-10-CM | POA: Diagnosis present

## 2023-04-25 DIAGNOSIS — Z833 Family history of diabetes mellitus: Secondary | ICD-10-CM

## 2023-04-25 DIAGNOSIS — K551 Chronic vascular disorders of intestine: Secondary | ICD-10-CM | POA: Diagnosis present

## 2023-04-25 DIAGNOSIS — S61419A Laceration without foreign body of unspecified hand, initial encounter: Secondary | ICD-10-CM | POA: Diagnosis present

## 2023-04-25 DIAGNOSIS — E869 Volume depletion, unspecified: Secondary | ICD-10-CM | POA: Diagnosis present

## 2023-04-25 DIAGNOSIS — N179 Acute kidney failure, unspecified: Secondary | ICD-10-CM | POA: Diagnosis present

## 2023-04-25 DIAGNOSIS — Z9049 Acquired absence of other specified parts of digestive tract: Secondary | ICD-10-CM

## 2023-04-25 DIAGNOSIS — Z860101 Personal history of adenomatous and serrated colon polyps: Secondary | ICD-10-CM

## 2023-04-25 DIAGNOSIS — Z881 Allergy status to other antibiotic agents status: Secondary | ICD-10-CM

## 2023-04-25 DIAGNOSIS — C50911 Malignant neoplasm of unspecified site of right female breast: Secondary | ICD-10-CM | POA: Diagnosis present

## 2023-04-25 DIAGNOSIS — I10 Essential (primary) hypertension: Secondary | ICD-10-CM | POA: Diagnosis present

## 2023-04-25 DIAGNOSIS — Z79810 Long term (current) use of selective estrogen receptor modulators (SERMs): Secondary | ICD-10-CM

## 2023-04-25 DIAGNOSIS — Z90710 Acquired absence of both cervix and uterus: Secondary | ICD-10-CM | POA: Diagnosis not present

## 2023-04-25 DIAGNOSIS — E876 Hypokalemia: Secondary | ICD-10-CM | POA: Diagnosis present

## 2023-04-25 DIAGNOSIS — D649 Anemia, unspecified: Secondary | ICD-10-CM | POA: Diagnosis present

## 2023-04-25 DIAGNOSIS — Z800 Family history of malignant neoplasm of digestive organs: Secondary | ICD-10-CM

## 2023-04-25 DIAGNOSIS — A0472 Enterocolitis due to Clostridium difficile, not specified as recurrent: Principal | ICD-10-CM | POA: Diagnosis present

## 2023-04-25 DIAGNOSIS — R531 Weakness: Secondary | ICD-10-CM | POA: Diagnosis present

## 2023-04-25 DIAGNOSIS — K529 Noninfective gastroenteritis and colitis, unspecified: Principal | ICD-10-CM

## 2023-04-25 DIAGNOSIS — Z7982 Long term (current) use of aspirin: Secondary | ICD-10-CM | POA: Diagnosis not present

## 2023-04-25 DIAGNOSIS — Z8719 Personal history of other diseases of the digestive system: Secondary | ICD-10-CM

## 2023-04-25 DIAGNOSIS — Z885 Allergy status to narcotic agent status: Secondary | ICD-10-CM

## 2023-04-25 LAB — CBC WITH DIFFERENTIAL/PLATELET
Abs Immature Granulocytes: 0.03 10*3/uL (ref 0.00–0.07)
Basophils Absolute: 0 10*3/uL (ref 0.0–0.1)
Basophils Relative: 0 %
Eosinophils Absolute: 0 10*3/uL (ref 0.0–0.5)
Eosinophils Relative: 0 %
HCT: 35.5 % — ABNORMAL LOW (ref 36.0–46.0)
Hemoglobin: 12.1 g/dL (ref 12.0–15.0)
Immature Granulocytes: 0 %
Lymphocytes Relative: 7 %
Lymphs Abs: 0.7 10*3/uL (ref 0.7–4.0)
MCH: 28.6 pg (ref 26.0–34.0)
MCHC: 34.1 g/dL (ref 30.0–36.0)
MCV: 83.9 fL (ref 80.0–100.0)
Monocytes Absolute: 0.4 10*3/uL (ref 0.1–1.0)
Monocytes Relative: 5 %
Neutro Abs: 8 10*3/uL — ABNORMAL HIGH (ref 1.7–7.7)
Neutrophils Relative %: 88 %
Platelets: 205 10*3/uL (ref 150–400)
RBC: 4.23 MIL/uL (ref 3.87–5.11)
RDW: 13.1 % (ref 11.5–15.5)
WBC: 9.2 10*3/uL (ref 4.0–10.5)
nRBC: 0 % (ref 0.0–0.2)

## 2023-04-25 LAB — COMPREHENSIVE METABOLIC PANEL
ALT: 38 U/L (ref 0–44)
AST: 31 U/L (ref 15–41)
Albumin: 2.8 g/dL — ABNORMAL LOW (ref 3.5–5.0)
Alkaline Phosphatase: 56 U/L (ref 38–126)
Anion gap: 11 (ref 5–15)
BUN: 19 mg/dL (ref 8–23)
CO2: 21 mmol/L — ABNORMAL LOW (ref 22–32)
Calcium: 8 mg/dL — ABNORMAL LOW (ref 8.9–10.3)
Chloride: 101 mmol/L (ref 98–111)
Creatinine, Ser: 1.42 mg/dL — ABNORMAL HIGH (ref 0.44–1.00)
GFR, Estimated: 36 mL/min — ABNORMAL LOW (ref >60)
Glucose, Bld: 143 mg/dL — ABNORMAL HIGH (ref 70–99)
Potassium: 2.7 mmol/L — CL (ref 3.5–5.1)
Sodium: 133 mmol/L — ABNORMAL LOW (ref 135–145)
Total Bilirubin: 1 mg/dL (ref 0.3–1.2)
Total Protein: 5.6 g/dL — ABNORMAL LOW (ref 6.5–8.1)

## 2023-04-25 LAB — LIPASE, BLOOD: Lipase: 20 U/L (ref 11–51)

## 2023-04-25 LAB — C DIFFICILE QUICK SCREEN W PCR REFLEX
C Diff antigen: POSITIVE — AB
C Diff interpretation: DETECTED
C Diff toxin: POSITIVE — AB

## 2023-04-25 LAB — TROPONIN I (HIGH SENSITIVITY)
Troponin I (High Sensitivity): 8 ng/L (ref <18)
Troponin I (High Sensitivity): 8 ng/L (ref <18)

## 2023-04-25 LAB — SARS CORONAVIRUS 2 BY RT PCR: SARS Coronavirus 2 by RT PCR: NEGATIVE

## 2023-04-25 LAB — MAGNESIUM: Magnesium: 1.5 mg/dL — ABNORMAL LOW (ref 1.7–2.4)

## 2023-04-25 LAB — LACTIC ACID, PLASMA: Lactic Acid, Venous: 1.8 mmol/L (ref 0.5–1.9)

## 2023-04-25 MED ORDER — LACTATED RINGERS IV SOLN: INTRAVENOUS | Status: DC

## 2023-04-25 MED ORDER — PIPERACILLIN-TAZOBACTAM 3.375 G IVPB 30 MIN
3.3750 g | Freq: Once | INTRAVENOUS | Status: AC
  Administered 2023-04-25: 3.375 g via INTRAVENOUS
  Filled 2023-04-25: qty 50

## 2023-04-25 MED ORDER — HEPARIN SODIUM (PORCINE) 5000 UNIT/ML IJ SOLN
5000.0000 [IU] | Freq: Three times a day (TID) | INTRAMUSCULAR | Status: DC
  Administered 2023-04-25 – 2023-04-29 (×11): 5000 [IU] via SUBCUTANEOUS
  Filled 2023-04-25 (×11): qty 1

## 2023-04-25 MED ORDER — POTASSIUM CHLORIDE 10 MEQ/100ML IV SOLN
10.0000 meq | INTRAVENOUS | Status: AC
  Administered 2023-04-25 (×3): 10 meq via INTRAVENOUS
  Filled 2023-04-25 (×3): qty 100

## 2023-04-25 MED ORDER — SODIUM CHLORIDE 0.9 % IV SOLN: Freq: Once | INTRAVENOUS | Status: AC

## 2023-04-25 MED ORDER — SODIUM CHLORIDE 0.9 % IV SOLN: INTRAVENOUS | Status: DC | PRN

## 2023-04-25 MED ORDER — ACETAMINOPHEN 650 MG RE SUPP: 650.0000 mg | Freq: Four times a day (QID) | RECTAL | Status: DC | PRN

## 2023-04-25 MED ORDER — VANCOMYCIN HCL 125 MG PO CAPS
125.0000 mg | ORAL_CAPSULE | Freq: Four times a day (QID) | ORAL | Status: DC
  Administered 2023-04-25 – 2023-04-29 (×16): 125 mg via ORAL
  Filled 2023-04-25 (×20): qty 1

## 2023-04-25 MED ORDER — ACETAMINOPHEN 500 MG PO TABS: 1000.0000 mg | ORAL_TABLET | Freq: Once | ORAL | Status: DC

## 2023-04-25 MED ORDER — POTASSIUM CHLORIDE 10 MEQ/100ML IV SOLN
10.0000 meq | INTRAVENOUS | Status: AC
  Administered 2023-04-25 (×4): 10 meq via INTRAVENOUS
  Filled 2023-04-25 (×4): qty 100

## 2023-04-25 MED ORDER — SODIUM CHLORIDE 0.9% FLUSH
3.0000 mL | Freq: Two times a day (BID) | INTRAVENOUS | Status: DC
  Administered 2023-04-25 – 2023-04-29 (×7): 3 mL via INTRAVENOUS

## 2023-04-25 MED ORDER — ONDANSETRON HCL 4 MG/2ML IJ SOLN
4.0000 mg | Freq: Four times a day (QID) | INTRAMUSCULAR | Status: DC | PRN
  Administered 2023-04-26: 4 mg via INTRAVENOUS
  Filled 2023-04-25: qty 2

## 2023-04-25 MED ORDER — PIPERACILLIN-TAZOBACTAM 3.375 G IVPB: 3.3750 g | Freq: Three times a day (TID) | INTRAVENOUS | Status: DC

## 2023-04-25 MED ORDER — ACETAMINOPHEN 325 MG PO TABS: 650.0000 mg | ORAL_TABLET | Freq: Four times a day (QID) | ORAL | Status: DC | PRN

## 2023-04-25 MED ORDER — MAGNESIUM SULFATE 2 GM/50ML IV SOLN
2.0000 g | Freq: Once | INTRAVENOUS | Status: AC
  Administered 2023-04-25: 2 g via INTRAVENOUS
  Filled 2023-04-25: qty 50

## 2023-04-25 MED ORDER — LACTATED RINGERS IV BOLUS
1000.0000 mL | Freq: Once | INTRAVENOUS | Status: AC
  Administered 2023-04-25: 1000 mL via INTRAVENOUS

## 2023-04-25 MED ORDER — IOHEXOL 350 MG/ML SOLN
75.0000 mL | Freq: Once | INTRAVENOUS | Status: AC | PRN
  Administered 2023-04-25: 75 mL via INTRAVENOUS

## 2023-04-25 MED ORDER — ONDANSETRON HCL 4 MG PO TABS
4.0000 mg | ORAL_TABLET | Freq: Four times a day (QID) | ORAL | Status: DC | PRN
  Administered 2023-04-26 – 2023-04-28 (×3): 4 mg via ORAL
  Filled 2023-04-25 (×3): qty 1

## 2023-04-25 NOTE — Progress Notes (Signed)
Pharmacy Antibiotic Note  Fathima Roell is a 82 y.o. female admitted on 04/25/2023 with  weakness .  Pharmacy has been consulted for zosyn dosing. Pt is afebrile and WBC is WNL. SCr is elevated above baseline at 1.42.   Plan: Zosyn 3.375gm IV Q8H (4 hr inf) F/u renal fxn, C&S, clinical status   Height: 5\' 3"  (160 cm) Weight: 60.6 kg (133 lb 11.2 oz) IBW/kg (Calculated) : 52.4  Temp (24hrs), Avg:97.9 F (36.6 C), Min:97.9 F (36.6 C), Max:97.9 F (36.6 C)  Recent Labs  Lab 04/25/23 0819  WBC 9.2  CREATININE 1.42*  LATICACIDVEN 1.8    Estimated Creatinine Clearance: 24.4 mL/min (A) (by C-G formula based on SCr of 1.42 mg/dL (H)).    Allergies  Allergen Reactions   Doxycycline Nausea And Vomiting   Codeine Other (See Comments)    GI upset   Hydrocodone Nausea Only    Antimicrobials this admission: Zosyn 10/3>>  Dose adjustments this admission: N/A  Microbiology results: Pending  Thank you for allowing pharmacy to be a part of this patient's care.  Skarlet Lyons, Drake Leach 04/25/2023 9:14 AM

## 2023-04-25 NOTE — H&P (Signed)
History and Physical    Jasmine Cantu ZOX:[id number] DOB: 24-Jun-1941 DOA: 04/25/2023  PCP: Jasmine Montana, MD  Patient coming from: Home  I have personally briefly reviewed patient's old medical records in [facility]  Chief Complaint: Diarrhea, fatigue  HPI: Jasmine Cantu is a 82 y.o. female with medical history significant for HTN, invasive ductal carcinoma of right breast s/p lumpectomy, history of bowel ischemia with perforation s/p ex lap and small bowel resection June 2023 who presented to the ED for evaluation of nausea, fatigue, and diarrhea.  Patient reports new onset of frequent watery diarrhea beginning on Sunday 9/29.  She has had associated nausea without emesis.  She has had decreased appetite and oral intake.  She has been feeling progressively fatigued and generally weak.  She says anything she tries to eat or drink runs through her very quickly.  She denies any recent antibiotic use.  [facility] ED Course  Labs/Imaging on admission: I have personally reviewed following labs and imaging studies.  Initial vitals showed BP 113/42, pulse 63, RR 12, temp 97.9 F, SpO2 100% on room air.  Labs showed WBC 9.2, hemoglobin 12.1, platelet 205,000, sodium 133, potassium 2.7, magnesium 1.5, bicarb 21, BUN 19, creatinine 1.42, serum glucose 143, LFTs within normal limits, lipase 20, lactic acid 1.8, troponin 8 x 2.  CT abdomen/pelvis with and without contrast showed diffuse wall thickening along the colon with stranding consistent with diffuse colitis.  No obstruction or dilation.  Large hiatal hernia noted.  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  Stool studies obtained.  C. difficile antigen and toxin both positive.  Patient was given 1 L LR, IV magnesium 2 g, IV K 55M EQ x 4, IV Zosyn.  The hospitalist service was consulted to admit for further evaluation and management.  Review of Systems: All systems  reviewed and are negative except as documented in history of present illness above.   Past Medical History:  Diagnosis Date   Allergy    Anemia    Cataract    Collagenous colitis 2004   Diverticulosis    Esophageal stricture 1988   GERD (gastroesophageal reflux disease)    H. pylori infection    Hiatal hernia    Hypertension    IBS (irritable bowel syndrome)    Iron deficiency anemia    Tubular adenoma of colon    Ulcer    Vitamin B12 deficiency     Past Surgical History:  Procedure Laterality Date   APPENDECTOMY     BLADDER SUSPENSION     BREAST BIOPSY Right 01/10/2023   [place] RT BREAST BX W LOC DEV 1ST LESION IMG BX SPEC US GUIDE 01/10/2023 [facility]   BREAST BIOPSY  01/25/2023   [place] RT RADIOACTIVE SEED LOC 01/25/2023 [facility]   BREAST LUMPECTOMY WITH RADIOACTIVE SEED LOCALIZATION Right 01/28/2023   Procedure: RIGHT BREAST LUMPECTOMY WITH RADIOACTIVE SEED LOCALIZATION;  Surgeon: Jasmine Lint, MD;  Location: [facility];  Service: General;  Laterality: Right;   CHOLECYSTECTOMY     EYE SURGERY     LAPAROTOMY N/A 01/08/2022   Procedure: EXPLORATORY LAPAROTOMY, SMALL BOWEL RESECTION;  Surgeon: Jasmine Loron, MD;  Location: [facility];  Service: General;  Laterality: N/A;   VAGINAL HYSTERECTOMY      Social History:  reports that she has never smoked. She has never used smokeless tobacco. She reports that she does not drink alcohol and does not use drugs.  Allergies  Allergen Reactions   Doxycycline Nausea And Vomiting   Codeine Other (See Comments)    GI upset   Hydrocodone Nausea Only    Family History  Problem Relation Age of Onset   Diabetes Mother    Colon cancer Mother    Stroke Father    Cancer Sister    Cancer Brother    Heart attack Brother    Cancer Brother    Heart attack Brother      Prior to Admission medications   Medication Sig Start Date End Date Taking? Authorizing Provider  acetaminophen (TYLENOL) 500 MG tablet Take  500 mg by mouth every 6 (six) hours as needed for mild pain.   Yes [provider]  benazepril (LOTENSIN) 10 MG tablet Take 10 mg by mouth daily.   Yes [provider]  calcium carbonate (TUMS - DOSED IN MG ELEMENTAL CALCIUM) 500 MG chewable tablet Chew 1 tablet by mouth daily.   Yes [provider]  Cholecalciferol (VITAMIN D3) 50 MCG (2000 UT) capsule Take 200 Units by mouth daily.   Yes [provider]  cyanocobalamin (,VITAMIN B-12,) 1000 MCG/ML injection Inject 1,000 mcg into the muscle every 30 (thirty) days. 11/28/21  Yes [provider]  tamoxifen (NOLVADEX) 20 MG tablet Take 1 tablet (20 mg total) by mouth daily. 03/07/23  Yes Cantu, Jasmine Daughters, NP  traMADol (ULTRAM) 50 MG tablet Take 1-2 tablets (50-100 mg total) by mouth every 6 (six) hours as needed for moderate pain or severe pain. Patient taking differently: Take 50 mg by mouth every 6 (six) hours as needed for moderate pain or severe pain. 01/28/23  Yes Jasmine Lint, MD  traMADol (ULTRAM) 50 MG tablet Take 1 tablet (50 mg total) by mouth every 6 (six) hours as needed (mild pain). Patient not taking: Reported on 04/25/2023 01/28/23   Jasmine Lint, MD  ipratropium (ATROVENT) 0.06 % nasal spray Place 2 sprays into both nostrils 4 (four) times daily. 08/05/19 11/19/19  Jasmine Fisher, PA-C    Physical Exam: Vitals:   04/25/23 1400 04/25/23 1430 04/25/23 1433 04/25/23 1555  BP:  (!) 137/47  (!) 147/49  Pulse: 63 66  65  Resp: 18 14  16   Temp:   98.5 F (36.9 C) 97.7 F (36.5 C)  TempSrc:   Oral Oral  SpO2: 99% 98%  99%  Weight:      Height:       Constitutional: Resting in bed, NAD, calm, comfortable Eyes: EOMI, lids and conjunctivae normal ENMT: Mucous membranes are dry. Posterior pharynx clear of any exudate or lesions.Normal dentition.  Neck: normal, supple, no masses. Respiratory: clear to auscultation bilaterally, no wheezing, no crackles. Normal respiratory effort. No accessory  muscle use.  Cardiovascular: Regular rate and rhythm, no murmurs / rubs / gallops. No extremity edema. 2+ pedal pulses. Abdomen: Mild generalized tenderness to palpation, no masses palpated.  Musculoskeletal: no clubbing / cyanosis. No joint deformity upper and lower extremities. Good ROM, no contractures. Normal muscle tone.  Skin: no rashes, lesions, ulcers. No induration Neurologic: Sensation intact. Strength 5/5 in all 4.  Psychiatric: Normal judgment and insight. Alert and oriented x 3. Normal mood.   EKG: Personally reviewed. Sinus rhythm, rate 75, nonspecific ST changes throughout.  ST changes are new when compared to previous.  Assessment/Plan Principal Problem:   C. difficile colitis Active Problems:   Hypokalemia   Hypomagnesemia   AKI (acute kidney injury) (HCC)   Stenosis of inferior mesenteric artery (HCC)  Madalena Vanhorne is a 82 y.o. female with medical history significant for HTN, invasive ductal carcinoma of right breast s/p lumpectomy, history of bowel ischemia with perforation s/p ex lap and small bowel resection June 2023 who is admitted with C. difficile colitis.  Assessment and Plan: C. difficile colitis: Still having frequent diarrhea.  CT with diffuse colitis.  C. difficile testing is antigen and toxin positive.  Patient denies any recent antibiotic use. -Start oral vancomycin -Continue IV fluid hydration overnight -Advance diet as tolerated  Hypokalemia/hypomagnesemia: Secondary to GI losses.  Initial repletion started in the ED.  Give additional IV potassium supplement tonight.  Acute kidney injury: Secondary to volume depletion from GI losses.  Creatinine 1.42 compared to baseline 0.8-1.0. -Continue IV fluid hydration overnight and repeat labs in a.m. -Hold home benazepril  Stenosis of inferior mesenteric artery: Significant ostial stenosis of the IMA with some poststenotic dilatation noted on CT imaging.  She has some abdominal pain which is more likely  related to her C. difficile colitis.  Hypertension: Holding benazepril due to AKI and soft blood pressure.   DVT prophylaxis: heparin injection 5,000 Units Start: 04/25/23 2200 Code Status: Full code, confirmed with patient on admission Family Communication: Daughter at bedside Disposition Plan: From home and likely discharge to home pending clinical progress Consults called: EDP spoke with general surgery and vascular surgery Severity of Illness: The appropriate patient status for this patient is INPATIENT. Inpatient status is judged to be reasonable and necessary in order to provide the required intensity of service to ensure the patient's safety. The patient's presenting symptoms, physical exam findings, and initial radiographic and laboratory data in the context of their chronic comorbidities is felt to place them at high risk for further clinical deterioration. Furthermore, it is not anticipated that the patient will be medically stable for discharge from the hospital within 2 midnights of admission.   * I certify that at the point of admission it is my clinical judgment that the patient will require inpatient hospital care spanning beyond 2 midnights from the point of admission due to high intensity of service, high risk for further deterioration and high frequency of surveillance required.Darreld Mclean MD [facility]  If 7PM-7AM, please contact night-coverage [email or web address]  04/25/2023, 6:31 PM

## 2023-04-25 NOTE — ED Provider Notes (Signed)
[place] [facility] AT [facility] Provider Note   CSN: [id number] Arrival date & time: 04/25/23  8295     History  Chief Complaint  Patient presents with   Weakness    Jasmine Cantu is a 82 y.o. female.  HPI      82 year old female with a history of hypertension, invasive ductal carcinoma of the right breast, history of small bowel ischemia with perforation requiring exploratory laparotomy and small bowel resection June 2023, who presents with concern for nausea, fatigue and diarrhea.  Reports that she has been feeling sick since Sunday.  On Sunday, she began to feel fatigued, had slimy stools with mucus, decreased appetite.  Over the last 2 days her symptoms have worsened with development of nearly constant diarrhea, with daughter reporting that it occurs every time she stands up.  They deny any black, tarry or bloody stools.  She has had nausea, but no vomiting.  She has had associated lightheadedness.  She has not taken any of her medication today.  Yesterday, had a fever greater than 101.  Has not had dysuria, chest pain, shortness of breath, or cough.  Daughter reports that she does have some chronic sneezing and rhinorrhea which is unchanged.   Past Medical History:  Diagnosis Date   Allergy    Anemia    Cataract    Collagenous colitis 2004   Diverticulosis    Esophageal stricture 1988   GERD (gastroesophageal reflux disease)    H. pylori infection    Hiatal hernia    Hypertension    IBS (irritable bowel syndrome)    Iron deficiency anemia    Tubular adenoma of colon    Ulcer    Vitamin B12 deficiency     Home Medications Prior to Admission medications   Medication Sig Start Date End Date Taking? Authorizing Provider  acetaminophen (TYLENOL) 500 MG tablet Take 500 mg by mouth every 6 (six) hours as needed for mild pain.   Yes [provider]  benazepril (LOTENSIN) 10 MG tablet Take 10 mg by mouth daily.   Yes [provider]  calcium carbonate (TUMS - DOSED IN MG ELEMENTAL CALCIUM) 500 MG chewable tablet Chew 1 tablet by mouth daily.   Yes [provider]  Cholecalciferol (VITAMIN D3) 50 MCG (2000 UT) capsule Take 200 Units by mouth daily.   Yes [provider]  cyanocobalamin (,VITAMIN B-12,) 1000 MCG/ML injection Inject 1,000 mcg into the muscle every 30 (thirty) days. 11/28/21  Yes [provider]  tamoxifen (NOLVADEX) 20 MG tablet Take 1 tablet (20 mg total) by mouth daily. 03/07/23  Yes Causey, Larna Daughters, NP  traMADol (ULTRAM) 50 MG tablet Take 1-2 tablets (50-100 mg total) by mouth every 6 (six) hours as needed for moderate pain or severe pain. Patient taking differently: Take 50 mg by mouth every 6 (six) hours as needed for moderate pain or severe pain. 01/28/23  Yes Almond Lint, MD  traMADol (ULTRAM) 50 MG tablet Take 1 tablet (50 mg total) by mouth every 6 (six) hours as needed (mild pain). Patient not taking: Reported on 04/25/2023 01/28/23   Almond Lint, MD  ipratropium (ATROVENT) 0.06 % nasal spray Place 2 sprays into both nostrils 4 (four) times daily. 08/05/19 11/19/19  Belinda Fisher, PA-C      Allergies    Doxycycline, Codeine, and Hydrocodone    Review of Systems   Review of Systems  Physical Exam Updated Vital Signs BP (!) 147/49 (BP Location:  Left Arm)   Pulse 65   Temp 97.7 F (36.5 C) (Oral)   Resp 16   Ht 5\' 3"  (1.6 m)   Wt 60.6 kg   SpO2 99%   BMI 23.68 kg/m  Physical Exam Vitals and nursing note reviewed.  Constitutional:      General: She is not in acute distress.    Appearance: She is well-developed. She is not diaphoretic.  HENT:     Head: Normocephalic and atraumatic.  Eyes:     Conjunctiva/sclera: Conjunctivae normal.  Cardiovascular:     Rate and Rhythm: Normal rate and regular rhythm.     Heart sounds: Normal heart sounds. No murmur heard.    No friction rub. No gallop.  Pulmonary:     Effort: Pulmonary effort is normal. No respiratory  distress.     Breath sounds: Normal breath sounds. No wheezing or rales.  Abdominal:     General: There is no distension.     Palpations: Abdomen is soft.     Tenderness: There is abdominal tenderness (diffuse). There is no guarding.  Musculoskeletal:        General: No tenderness.     Cervical back: Normal range of motion.  Skin:    General: Skin is warm and dry.     Findings: No erythema or rash.  Neurological:     Mental Status: She is alert and oriented to person, place, and time.     ED Results / Procedures / Treatments   Labs (all labs ordered are listed, but only abnormal results are displayed) Labs Reviewed  C DIFFICILE QUICK SCREEN W PCR REFLEX   - Abnormal; Notable for the following components:      Result Value   C Diff antigen POSITIVE (*)    C Diff toxin POSITIVE (*)    All other components within normal limits  CBC WITH DIFFERENTIAL/PLATELET - Abnormal; Notable for the following components:   HCT 35.5 (*)    Neutro Abs 8.0 (*)    All other components within normal limits  COMPREHENSIVE METABOLIC PANEL - Abnormal; Notable for the following components:   Sodium 133 (*)    Potassium 2.7 (*)    CO2 21 (*)    Glucose, Bld 143 (*)    Creatinine, Ser 1.42 (*)    Calcium 8.0 (*)    Total Protein 5.6 (*)    Albumin 2.8 (*)    GFR, Estimated 36 (*)    All other components within normal limits  MAGNESIUM - Abnormal; Notable for the following components:   Magnesium 1.5 (*)    All other components within normal limits  SARS CORONAVIRUS 2 BY RT PCR  CULTURE, BLOOD (ROUTINE X 2)  CULTURE, BLOOD (ROUTINE X 2)  GASTROINTESTINAL PANEL BY PCR, STOOL (REPLACES STOOL CULTURE)  LIPASE, BLOOD  LACTIC ACID, PLASMA  URINALYSIS, W/ REFLEX TO CULTURE (INFECTION SUSPECTED)  MAGNESIUM  CBC  BASIC METABOLIC PANEL  TROPONIN I (HIGH SENSITIVITY)  TROPONIN I (HIGH SENSITIVITY)    EKG EKG Interpretation Date/Time:  Thursday April 25 2023 08:05:11 EDT Ventricular Rate:   75 PR Interval:  159 QRS Duration:  88 QT Interval:  428 QTC Calculation: 479 R Axis:   17  Text Interpretation: Sinus rhythm Nonspecific repol abnormality, diffuse leads ST changes new in comparison to prior Confirmed by Alvira Monday ([id number]) on 04/25/2023 8:06:22 AM  Radiology CT Angio Abd/Pel W and/or Wo Contrast  Result Date: 04/25/2023 CLINICAL DATA:  Diarrhea, fever nausea with decreased appetite.  Cramping and bowel leakage. EXAM: CTA ABDOMEN AND PELVIS WITHOUT AND WITH CONTRAST TECHNIQUE: Multidetector CT imaging of the abdomen and pelvis was performed using the standard protocol during bolus administration of intravenous contrast. Multiplanar reconstructed images and MIPs were obtained and reviewed to evaluate the vascular anatomy. RADIATION DOSE REDUCTION: This exam was performed according to the departmental dose-optimization program which includes automated exposure control, adjustment of the mA and/or kV according to patient size and/or use of iterative reconstruction technique. CONTRAST:  75mL OMNIPAQUE IOHEXOL 350 MG/ML SOLN COMPARISON:  None Available. FINDINGS: VASCULAR Aorta: Scattered vascular calcifications. No dissection or aneurysm formation. Celiac: Mild calcified plaque at the origin without significant stenosis. Of note there is a replaced left hepatic artery from the left gastric. Tortuous course of the splenic artery. SMA: Patent without evidence of aneurysm, dissection, vasculitis or significant stenosis. Renals: Single right main renal artery with some moderate calcified plaque at the origin. There several left-sided separate renal arteries. At least 3 are identified. IMA: Ostial high-grade stenosis with some post stenotic dilatation. Inflow: Mild atherosclerotic calcified plaque along the iliac vessels. Proximal Outflow: Common femoral arteries and visualized SFA vessels has some minimal disease. Veins: No obvious venous abnormality within the limitations of this arterial  phase study. Review of the MIP images confirms the above findings. NON-VASCULAR Lower chest: Large hiatal hernia.  No pleural effusion lung bases. Hepatobiliary: With the limits of the early phase of the arterial bolus, grossly no clear space-occupying liver lesion. Previous cholecystectomy. Pancreas: Mild global pancreatic atrophy.  No obvious mass Spleen: Normal in size without focal abnormality.  Splenule. Adrenals/Urinary Tract: Nodular thickening of the medial limb of the right adrenal gland. Is also thickening of the inferior aspect of the left adrenal gland measuring 15 x 14 mm. Not clearly an adenoma. Moderate atrophy of the right kidney with some punctate calcifications. Parapelvic left-sided renal cysts are identified. The ureters have normal course and caliber extending down to the bladder. Preserved contours of the urinary bladder. Stomach/Bowel: Again large hiatal hernia. The small bowel is nondilated. The large bowel is also nondilated but has diffuse wall thickening with stranding consistent with a pancolitis. Left-sided colonic diverticula are also seen. There are some surgical changes along loops of small bowel in the left upper abdomen. Lymphatic: No specific abnormal lymph node enlargement identified in the abdomen and pelvis. Reproductive: Status post hysterectomy. No adnexal masses. Other: No free intra-abdominal air. No rim enhancing fluid collections. Musculoskeletal: Transitional lumbosacral segment. Scattered degenerative changes of the spine and pelvis. IMPRESSION: VASCULAR Scattered vascular calcifications. 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 NON-VASCULAR Diffuse wall thickening along the colon with stranding consistent with a diffuse colitis. No obstruction or dilatation. Scattered colonic diverticula Bilateral adrenal nodularity. Not clearly adenomas. Recommend dedicated  workup when appropriate. Large hiatal hernia. Electronically Signed   By: Karen Kays M.D.   On: 04/25/2023 12:31    Procedures Procedures    Medications Ordered in ED Medications  0.9 %  sodium chloride infusion (0 mLs Intravenous Stopped 04/25/23 1304)  acetaminophen (TYLENOL) tablet 1,000 mg (1,000 mg Oral Not Given 04/25/23 1657)  vancomycin (VANCOCIN) capsule 125 mg (125 mg Oral Given 04/25/23 2105)  heparin injection 5,000 Units (5,000 Units Subcutaneous Given 04/25/23 2104)  sodium chloride flush (NS) 0.9 % injection 3 mL (3 mLs Intravenous Given 04/25/23 2218)  lactated ringers infusion ( Intravenous New Bag/Given 04/25/23 1851)  acetaminophen (TYLENOL)  tablet 650 mg (has no administration in time range)    Or  acetaminophen (TYLENOL) suppository 650 mg (has no administration in time range)  ondansetron (ZOFRAN) tablet 4 mg (has no administration in time range)    Or  ondansetron (ZOFRAN) injection 4 mg (has no administration in time range)  potassium chloride 10 mEq in 100 mL IVPB (10 mEq Intravenous New Bag/Given 04/25/23 2211)  lactated ringers bolus 1,000 mL (0 mLs Intravenous Stopped 04/25/23 1006)  piperacillin-tazobactam (ZOSYN) IVPB 3.375 g (0 g Intravenous Stopped 04/25/23 1006)  iohexol (OMNIPAQUE) 350 MG/ML injection 75 mL (75 mLs Intravenous Contrast Given 04/25/23 0921)  potassium chloride 10 mEq in 100 mL IVPB (10 mEq Intravenous New Bag/Given 04/25/23 1304)  0.9 %  sodium chloride infusion ( Intravenous New Bag/Given 04/25/23 1004)  magnesium sulfate IVPB 2 g 50 mL (0 g Intravenous Stopped 04/25/23 1304)    ED Course/ Medical Decision Making/ A&P                                   82 year old female with a history of hypertension, invasive ductal carcinoma of the right breast, history of small bowel ischemia with perforation requiring exploratory laparotomy and small bowel resection June 2023, who presents with concern for nausea, fatigue and diarrhea.  Differential  diagnosis includes acute mesenteric ischemia, colitis including viral and bacterial causes such as C. difficile, gastroenteritis, cholecystitis, diverticulitis, GI bleed.  On arrival to the emergency department blood pressures decreased to 90s systolic.  EKG was completed and personally evaluated and interpreted by me showing diffuse ST depressions.  She denies any chest pain or shortness of breath, however in setting of her EKG changes, fatigue and nausea, also added on troponin to evaluate for signs of associated cardiac ischemia.  Labs completed and personally evaluated and interpreted by me show no evidence of leukocytosis, no anemia, hypokalemia, AKI, lipase WNL, transaminases WNL.  COVID-19 testing is negative.  Given her hypotension, fever yesterday, concern for possible sepsis and ordered blood cultures, lactic acid, and empiric Zosyn for possible intra-abdominal source of infection.   CTA abdomen pelvis was ordered given her history of mesenteric ischemia, abdominal pain, diarrhea, and shows diffuse colitis, IMA stenosis.  Discussed with General Surgery and Vascular Surgery possible ischemic colitis. OVerall, at this time feel infectious more likely however they will follow.    Will admit for hydration given AKI, borderline BP. K ordered. Admitted for further care.          Final Clinical Impression(s) / ED Diagnoses Final diagnoses:  Colitis  AKI (acute kidney injury) (HCC)  Hypokalemia    Rx / DC Orders ED Discharge Orders     None         Alvira Monday, MD 04/25/23 2248

## 2023-04-25 NOTE — ED Triage Notes (Signed)
Pt reports to the ED with complaints of having diarrhea, fever, nausea, decreased appetite. States that she has cramping and bowel leakage.

## 2023-04-25 NOTE — Progress Notes (Addendum)
Plan of Care Note for accepted transfer   Patient: Jasmine Cantu MRN: [id number]   DOA: 04/25/2023  Facility requesting transfer: [facility].  Requesting Provider: Alvira Monday, MD. Reason for transfer: Patient Facility course:  Per Dr. Dalene Seltzer: Chief Complaint  Patient presents with   Weakness     Jasmine Cantu is a 82 y.o. female.   HPI  82 year old female with a history of hypertension, invasive ductal carcinoma of the right breast, history of small bowel ischemia with perforation requiring exploratory laparotomy and small bowel resection June 2023, who presents with concern for nausea, fatigue and diarrhea.   Reports that she has been feeling sick since Sunday.  On Sunday, she began to feel fatigued, had slimy stools with mucus, decreased appetite.  Over the last 2 days her symptoms have worsened with development of nearly constant diarrhea, with daughter reporting that it occurs every time she stands up.  They deny any black, tarry or bloody stools.  She has had nausea, but no vomiting.  She has had associated lightheadedness.  She has not taken any of her medication today.  Yesterday, had a fever greater than 101.  Has not had dysuria, chest pain, shortness of breath, or cough.  Daughter reports that she does have some chronic sneezing and rhinorrhea which is unchanged.  Lab work: Magnesium [[id number]] (Abnormal)   Collected: 04/25/23 1057   Updated: 04/25/23 1111   Specimen Type: Blood    Magnesium 1.5 Low  mg/dL  Gastrointestinal Panel by PCR , Stool [[id number]]   Collected: 04/25/23 0954   Updated: 04/25/23 1053   Specimen Type: Stool   Troponin I (High Sensitivity) [[id number]]   Collected: 04/25/23 0954   Updated: 04/25/23 1034   Specimen Source: Vein    Troponin I (High Sensitivity) 8 ng/L  C Difficile Quick Screen w PCR reflex [[id number]]   Collected: 04/25/23 0954   Updated: 04/25/23 0956   Specimen Type: Stool   Blood culture (routine x 2) [[id number]]    Collected: 04/25/23 0910   Updated: 04/25/23 0926   Specimen Type: Blood   Blood culture (routine x 2) [[id number]]   Collected: 04/25/23 0820   Updated: 04/25/23 0924   Specimen Type: Blood   Comprehensive metabolic panel [[id number]] (Abnormal)   Collected: 04/25/23 0819   Updated: 04/25/23 0900   Specimen Type: Blood   Specimen Source: Vein    Sodium 133 Low  mmol/L   Potassium 2.7 Low Panic  mmol/L   Chloride 101 mmol/L   CO2 21 Low  mmol/L   Glucose, Bld 143 High  mg/dL   BUN 19 mg/dL   Creatinine, Ser 3.22 High  mg/dL   Calcium 8.0 Low  mg/dL   Total Protein 5.6 Low  g/dL   Albumin 2.8 Low  g/dL   AST 31 U/L   ALT 38 U/L   Alkaline Phosphatase 56 U/L   Total Bilirubin 1.0 mg/dL   GFR, Estimated 36 Low  mL/min   Anion gap 11  Lipase, blood [[id number]]   Collected: 04/25/23 0819   Updated: 04/25/23 0900   Specimen Type: Blood   Specimen Source: Vein    Lipase 20 U/L  SARS Coronavirus 2 by RT PCR (hospital order, performed in [facility] hospital lab) *cepheid single result test* Anterior Nasal Swab [[id number]]   Collected: 04/25/23 0819   Updated: 04/25/23 0858   Specimen Source: Anterior Nasal Swab    SARS Coronavirus 2 by RT PCR NEGATIVE  Troponin I (High Sensitivity) [[id number]]  Collected: 04/25/23 0819   Updated: 04/25/23 0854   Specimen Source: Vein    Troponin I (High Sensitivity) 8 ng/L  Lactic acid, plasma [[id number]]   Collected: 04/25/23 0819   Updated: 04/25/23 0849   Specimen Type: Blood   Specimen Source: Vein    Lactic Acid, Venous 1.8 mmol/L  CBC with Differential [[id number]] (Abnormal)   Collected: 04/25/23 0819   Updated: 04/25/23 0828   Specimen Type: Blood   Specimen Source: Vein    WBC 9.2 K/uL   RBC 4.23 MIL/uL   Hemoglobin 12.1 g/dL   HCT 78.2 Low  %   MCV 83.9 fL   MCH 28.6 pg   MCHC 34.1 g/dL   RDW 95.6 %   Platelets 205 K/uL   nRBC 0.0 %   Neutrophils Relative % 88 %   Neutro Abs 8.0 High  K/uL   Lymphocytes Relative 7  %   Lymphs Abs 0.7 K/uL   Monocytes Relative 5 %   Monocytes Absolute 0.4 K/uL   Eosinophils Relative 0 %   Eosinophils Absolute 0.0 K/uL   Basophils Relative 0 %   Basophils Absolute 0.0 K/uL   Immature Granulocytes 0 %   Abs Immature Granulocytes 0.03 K/uL   CT Angio Abd/Pel W and/or Wo Contrast  Result Date: 04/25/2023 CLINICAL DATA:  Diarrhea, fever nausea with decreased appetite. Cramping and bowel leakage. EXAM: CTA ABDOMEN AND PELVIS WITHOUT AND WITH CONTRAST TECHNIQUE: Multidetector CT imaging of the abdomen and pelvis was performed using the standard protocol during bolus administration of intravenous contrast. Multiplanar reconstructed images and MIPs were obtained and reviewed to evaluate the vascular anatomy. RADIATION DOSE REDUCTION: This exam was performed according to the departmental dose-optimization program which includes automated exposure control, adjustment of the mA and/or kV according to patient size and/or use of iterative reconstruction technique. CONTRAST:  75mL OMNIPAQUE IOHEXOL 350 MG/ML SOLN COMPARISON:  None Available. FINDINGS: VASCULAR Aorta: Scattered vascular calcifications. No dissection or aneurysm formation. Celiac: Mild calcified plaque at the origin without significant stenosis. Of note there is a replaced left hepatic artery from the left gastric. Tortuous course of the splenic artery. SMA: Patent without evidence of aneurysm, dissection, vasculitis or significant stenosis. Renals: Single right main renal artery with some moderate calcified plaque at the origin. There several left-sided separate renal arteries. At least 3 are identified. IMA: Ostial high-grade stenosis with some post stenotic dilatation. Inflow: Mild atherosclerotic calcified plaque along the iliac vessels. Proximal Outflow: Common femoral arteries and visualized SFA vessels has some minimal disease. Veins: No obvious venous abnormality within the limitations of this arterial phase study. Review  of the MIP images confirms the above findings. NON-VASCULAR Lower chest: Large hiatal hernia.  No pleural effusion lung bases. Hepatobiliary: With the limits of the early phase of the arterial bolus, grossly no clear space-occupying liver lesion. Previous cholecystectomy. Pancreas: Mild global pancreatic atrophy.  No obvious mass Spleen: Normal in size without focal abnormality.  Splenule. Adrenals/Urinary Tract: Nodular thickening of the medial limb of the right adrenal gland. Is also thickening of the inferior aspect of the left adrenal gland measuring 15 x 14 mm. Not clearly an adenoma. Moderate atrophy of the right kidney with some punctate calcifications. Parapelvic left-sided renal cysts are identified. The ureters have normal course and caliber extending down to the bladder. Preserved contours of the urinary bladder. Stomach/Bowel: Again large hiatal hernia. The small bowel is nondilated. The large bowel is also nondilated but has diffuse wall thickening with stranding consistent with  a pancolitis. Left-sided colonic diverticula are also seen. There are some surgical changes along loops of small bowel in the left upper abdomen. Lymphatic: No specific abnormal lymph node enlargement identified in the abdomen and pelvis. Reproductive: Status post hysterectomy. No adnexal masses. Other: No free intra-abdominal air. No rim enhancing fluid collections. Musculoskeletal: Transitional lumbosacral segment. Scattered degenerative changes of the spine and pelvis. IMPRESSION: VASCULAR Scattered vascular calcifications. 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 NON-VASCULAR Diffuse wall thickening along the colon with stranding consistent with a diffuse colitis. No obstruction or dilatation. Scattered colonic diverticula Bilateral adrenal nodularity. Not clearly adenomas. Recommend dedicated workup when  appropriate. Large hiatal hernia. Electronically Signed   By: Karen Kays M.D.   On: 04/25/2023 12:31     Plan of care: The patient is accepted for admission to Telemetry unit, at [facility].  Will continue electrolyte replacement, IV hydration and IV antibiotics.  Dr. Dalene Seltzer spoke to vascular surgery on call who will see her when she arrives to [facility].  She also spoke to general surgery.  Please see her notes for further detail.  Author: Bobette Mo, MD 04/25/2023  Check [email or web address] for on-call coverage.  Nursing staff, Please call [facility] Admits & Consults System-Wide number on Amion as soon as patient's arrival, so appropriate admitting provider can evaluate the pt.

## 2023-04-25 NOTE — ED Notes (Signed)
ED Provider at bedside. 

## 2023-04-25 NOTE — Hospital Course (Signed)
Jasmine Cantu is a 82 y.o. female with medical history significant for HTN, invasive ductal carcinoma of right breast s/p lumpectomy, history of bowel ischemia with perforation s/p ex lap and small bowel resection June 2023 who is admitted with C. difficile colitis.

## 2023-04-26 ENCOUNTER — Other Ambulatory Visit (HOSPITAL_COMMUNITY): Payer: Self-pay

## 2023-04-26 DIAGNOSIS — A0472 Enterocolitis due to Clostridium difficile, not specified as recurrent: Secondary | ICD-10-CM | POA: Diagnosis not present

## 2023-04-26 LAB — GASTROINTESTINAL PANEL BY PCR, STOOL (REPLACES STOOL CULTURE)

## 2023-04-26 LAB — BASIC METABOLIC PANEL
Anion gap: 9 (ref 5–15)
BUN: 15 mg/dL (ref 8–23)
CO2: 20 mmol/L — ABNORMAL LOW (ref 22–32)
Calcium: 7.4 mg/dL — ABNORMAL LOW (ref 8.9–10.3)
Chloride: 103 mmol/L (ref 98–111)
Creatinine, Ser: 0.98 mg/dL (ref 0.44–1.00)
GFR, Estimated: 57 mL/min — ABNORMAL LOW (ref >60)
Glucose, Bld: 112 mg/dL — ABNORMAL HIGH (ref 70–99)
Potassium: 3.1 mmol/L — ABNORMAL LOW (ref 3.5–5.1)
Sodium: 132 mmol/L — ABNORMAL LOW (ref 135–145)

## 2023-04-26 LAB — CBC
HCT: 29.4 % — ABNORMAL LOW (ref 36.0–46.0)
Hemoglobin: 9.8 g/dL — ABNORMAL LOW (ref 12.0–15.0)
MCH: 28.7 pg (ref 26.0–34.0)
MCHC: 33.3 g/dL (ref 30.0–36.0)
MCV: 86 fL (ref 80.0–100.0)
Platelets: 157 10*3/uL (ref 150–400)
RBC: 3.42 MIL/uL — ABNORMAL LOW (ref 3.87–5.11)
RDW: 13.1 % (ref 11.5–15.5)
WBC: 6.3 10*3/uL (ref 4.0–10.5)
nRBC: 0 % (ref 0.0–0.2)

## 2023-04-26 LAB — MAGNESIUM: Magnesium: 1.9 mg/dL (ref 1.7–2.4)

## 2023-04-26 MED ORDER — SODIUM CHLORIDE 0.9 % IV SOLN: INTRAVENOUS | Status: DC

## 2023-04-26 MED ORDER — POTASSIUM CHLORIDE CRYS ER 20 MEQ PO TBCR
40.0000 meq | EXTENDED_RELEASE_TABLET | ORAL | Status: AC
  Administered 2023-04-26 – 2023-04-27 (×2): 40 meq via ORAL
  Filled 2023-04-26 (×2): qty 2

## 2023-04-26 NOTE — Plan of Care (Signed)

## 2023-04-26 NOTE — Evaluation (Signed)
Occupational Therapy Evaluation Patient Details Name: Jasmine Cantu MRN: [id number] DOB: March 10, 1941 Today's Date: 04/26/2023   History of Present Illness Pt is a 82 y/o female presenting on 10/3 with diarrhea, fatigue. Found with c difficile colitis, AKI. PMH includes: HTN, R breast cancer s/p lumpectomy, bowel ischemia with perforation s/p ex lap and small bowel resection 6/23, iron deficiency anemia.   Clinical Impression   PTA patient independent and driving. Admitted for above and presents with problem list below.  Patient completing bed mobility with min assist, transfers and functional mobility without AD and min guard, ADLs with up to min guard assist. Pt requires intermittent cueing for safety and line mgmt as she tends to move quickly, but is easily redirection.  Pt/daughter report cognition is at baseline; noted she is somewhat repetitive and does endorse getting confused when she wakes up at night in the hospital.  Daughter reports family typically checks in on pt frequently during the day, but can provide 24/7 if needed. Based on performance today, OT will follow acutely but anticipate no further needs after dc home.       If plan is discharge home, recommend the following: A little help with walking and/or transfers;A little help with bathing/dressing/bathroom;Assistance with cooking/housework;Assist for transportation    Functional Status Assessment  Patient has had a recent decline in their functional status and demonstrates the ability to make significant improvements in function in a reasonable and predictable amount of time.  Equipment Recommendations  None recommended by OT    Recommendations for Other Services       Precautions / Restrictions Precautions Precautions: Fall Restrictions Weight Bearing Restrictions: No      Mobility Bed Mobility Overal bed mobility: Needs Assistance Bed Mobility: Supine to Sit     Supine to sit: Min assist     General bed mobility  comments: pt reaching out for support to ascend trunk    Transfers Overall transfer level: Needs assistance Equipment used: None Transfers: Sit to/from Stand Sit to Stand: Contact guard assist                  Balance Overall balance assessment: Mild deficits observed, not formally tested                                         ADL either performed or assessed with clinical judgement   ADL Overall ADL's : Needs assistance/impaired     Grooming: Contact guard assist;Standing;Oral care           Upper Body Dressing : Set up;Sitting   Lower Body Dressing: Contact guard assist;Sit to/from stand   Toilet Transfer: Contact guard assist;Ambulation   Toileting- Clothing Manipulation and Hygiene: Contact guard assist;Sit to/from stand       Functional mobility during ADLs: Contact guard assist;Cueing for safety General ADL Comments: min guard for safety, cueing for line mgmt     Vision Baseline Vision/History: 1 Wears glasses Vision Assessment?: No apparent visual deficits     Perception         Praxis         Pertinent Vitals/Pain Pain Assessment Pain Assessment: No/denies pain     Extremity/Trunk Assessment Upper Extremity Assessment Upper Extremity Assessment: Right hand dominant;Overall [facility] for tasks assessed   Lower Extremity Assessment Lower Extremity Assessment: Defer to PT evaluation       Communication Communication Communication: No apparent difficulties  Cognition Arousal: Alert Behavior During Therapy: WFL for tasks assessed/performed Overall Cognitive Status: Within Functional Limits for tasks assessed                                 General Comments: pt repetitive at times, but appears functional.  Daughter reports pt is at her baseline.     General Comments  daughter at side and supportive    Exercises     Shoulder Instructions      Home Living Family/patient expects to be discharged to::  Private residence Living Arrangements: Alone Available Help at Discharge: Family Type of Home: House Home Access: Stairs to enter Secretary/administrator of Steps: 2 Entrance Stairs-Rails: Left Home Layout: One level     Bathroom Shower/Tub: Producer, television/film/video: Standard     Home Equipment: Grab bars - toilet   Additional Comments: son lives across the street, could have 24/7 support if needed from family      Prior Functioning/Environment Prior Level of Function : Independent/Modified Independent;Driving             Mobility Comments: no AD ADLs Comments: independent ADLs, IADLs, driving (family assists as needed/checks behind her)        OT Problem List: Decreased activity tolerance;Decreased knowledge of precautions;Decreased knowledge of use of DME or AE;Impaired balance (sitting and/or standing)      OT Treatment/Interventions: Self-care/ADL training;DME and/or AE instruction;Therapeutic activities;Patient/family education;Energy conservation    OT Goals(Current goals can be found in the care plan section) Acute Rehab OT Goals Patient Stated Goal: home OT Goal Formulation: With patient Time For Goal Achievement: 05/10/23 Potential to Achieve Goals: Good  OT Frequency: Min 1X/week    Co-evaluation              AM-PAC OT "6 Clicks" Daily Activity     Outcome Measure Help from another person eating meals?: None Help from another person taking care of personal grooming?: A Little Help from another person toileting, which includes using toliet, bedpan, or urinal?: A Little Help from another person bathing (including washing, rinsing, drying)?: A Little Help from another person to put on and taking off regular upper body clothing?: A Little Help from another person to put on and taking off regular lower body clothing?: A Little 6 Click Score: 19   End of Session Nurse Communication: Mobility status  Activity Tolerance: Patient tolerated  treatment well Patient left: in chair;with call bell/phone within reach;with chair alarm set;with family/visitor present  OT Visit Diagnosis: Other abnormalities of gait and mobility (R26.89)                Time: 0623-7628 OT Time Calculation (min): 24 min Charges:  OT General Charges $OT Visit: 1 Visit OT Evaluation $OT Eval Moderate Complexity: 1 Mod  Barry Brunner, OT Acute Rehabilitation Services Office [phone]   Chancy Milroy 04/26/2023, 1:19 PM

## 2023-04-26 NOTE — Progress Notes (Signed)
Pt self removed IVs,  Attempt to get out of bed to go to bathroom, re oriented to time and place, assisted to bathroom, iv attempts, iv team paged, pt informed of POC

## 2023-04-26 NOTE — Progress Notes (Signed)
PROGRESS NOTE  Jasmine Cantu  DOB: [birth year]  PCP: Jasmine Montana, MD XLK:[id number]  DOA: 04/25/2023  LOS: 1 day  Hospital Day: 2  Brief narrative: Jasmine Cantu is a 82 y.o. 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 10/3, patient presented to the ED with complaint of fever, abdominal cramping, worsening of chronic diarrhea, nausea, poor appetite for 3 days leading to progressive weakness.  In the ED, patient was afebrile, heart rate normal, blood pressure in low 100s, Initial labs with WBC count 9.2, hemoglobin 12.1, sodium 133, potassium low at 2.7, BUN/creatinine 19/1.42, magnesium 1.5  CT abdomen/pelvis with and without contrast showed diffuse wall thickening along the colon with stranding consistent with diffuse colitis.  No obstruction or dilation.  Large hiatal hernia noted.  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 Stool studies obtained.  C. difficile antigen and toxin both positive.  Patient was started on oral vancomycin, IV fluid, IV electrolyte replacement Admitted to [facility]  Subjective: Patient was seen and examined this morning.  Pleasant elderly Caucasian female.  Lying on bed.  Not in distress.  Daughter at bedside.  Continues to have diarrhea. Chart reviewed.  Remains afebrile and hemodynamically stable Repeat labs this morning with WC count normal, hemoglobin down to 9.8, potassium low but improved to 3.1, creatinine improved  Assessment and plan: C. difficile colitis Chronic diarrhea Presented with acute worsening of diarrhea that she has had for 3 months CT abdomen with diffuse colitis.  Stool assay with C. difficile antigen and toxin positive  Patient denies any recent use of antibiotics  She  has been started on a 10-day course of oral vancomycin  Continue IV hydration  IV Zofran for nausea, currently able to tolerate soft diet   Hypokalemia/hypomagnesemia: Low potassium and magnesium secondary to GI loss.  Replacements given.  Potassium low at 3.1 this morning.  Replacement ordered.  Obtain phosphorus level. Recent Labs  Lab 04/25/23 0819 04/25/23 1057 04/26/23 0423  K 2.7*  --  3.1*  MG  --  1.5* 1.9    Acute kidney injury Creatinine normal at baseline.  Presented with creatinine elevated 1.42 secondary to GI loss.  Improved with IV fluid.  Benazepril on hold. Recent Labs    04/25/23 0819 04/26/23 0423  BUN 19 15  CREATININE 1.42* 0.98   Hypertension Benazepril on hold.  Continue to monitor blood pressure.  IV hydralazine as needed.  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   She had bowel ischemia and perforation in 2023 requiring partial resection. Doesn't f/u with vascular surgery. Not on any aspirin, statin.  I am wondering if the colitis in CT scan is actually ischemic colitis provide to high-grade IMA stenosis. Wondering if any acute intervention is required.  I sent a message to vascular surgery.  Chronic anemia H/o GERD, esophageal stricture H/o iron-deficiency, but B12 deficiency Baseline hemoglobin close to 10. Was hemoconcentrated at presentation.  Hemoglobin back to baseline this morning. Obtain anemia panel Recent Labs    04/25/23 0819 04/26/23 0423  HGB 12.1 9.8*  MCV 83.9 86.0   H/o invasive ductal carcinoma of right breast  s/p lumpectomy  Mobility: Encourage ambulation.  PT eval ordered  Goals of care   Code Status: Full Code     DVT prophylaxis:  heparin injection 5,000 Units Start: 04/25/23 2200   Antimicrobials: Oral vancomycin Fluid: NS at 100  mL per Consultants: Vascular surgery Family Communication: Daughter at bedside  Status: Inpatient Level of care:  Telemetry Medical   Patient is from: Home Needs to continue in-hospital care: Oral vancomycin, monitor for diarrhea frequency Anticipated d/c to: Pending clinical course    Diet:  Diet Order             DIET SOFT Room service appropriate? Yes; Fluid consistency: Thin  Diet effective now                   Scheduled Meds:  acetaminophen  1,000 mg Oral Once   heparin  5,000 Units Subcutaneous Q8H   sodium chloride flush  3 mL Intravenous Q12H   vancomycin  125 mg Oral QID    PRN meds: sodium chloride, acetaminophen **OR** acetaminophen, ondansetron **OR** ondansetron (ZOFRAN) IV   Infusions:   sodium chloride 10 mL/hr at 04/26/23 0946   sodium chloride      Antimicrobials: Anti-infectives (From admission, onward)    Start     Dose/Rate Route Frequency Ordered Stop   04/25/23 1815  vancomycin (VANCOCIN) capsule 125 mg        125 mg Oral 4 times daily 04/25/23 1727 05/05/23 1759   04/25/23 1600  piperacillin-tazobactam (ZOSYN) IVPB 3.375 g  Status:  Discontinued        3.375 g 12.5 mL/hr over 240 Minutes Intravenous Every 8 hours 04/25/23 1353 04/25/23 1646   04/25/23 0845  piperacillin-tazobactam (ZOSYN) IVPB 3.375 g        3.375 g 100 mL/hr over 30 Minutes Intravenous  Once 04/25/23 0839 04/25/23 1006       Objective: Vitals:   04/26/23 0509 04/26/23 0802  BP: (!) 123/47 (!) 122/50  Pulse: 65 63  Resp: 20 16  Temp: 98 F (36.7 C) 98.1 F (36.7 C)  SpO2: 98% 100%    Intake/Output Summary (Last 24 hours) at 04/26/2023 1409 Last data filed at 04/26/2023 0948 Gross per 24 hour  Intake 243 ml  Output 0 ml  Net 243 ml   Filed Weights   04/25/23 0804  Weight: 60.6 kg   Weight change:  Body mass index is 23.68 kg/m.   Physical Exam: General exam: Pleasant, elderly Caucasian female.  Not in physical distress Skin: No rashes, lesions  or ulcers. HEENT: Atraumatic, normocephalic, no obvious bleeding Lungs: Clear to auscultation laterally CVS: Regular rate and rhythm, no murmur GI/Abd soft, nontender, nondistended, bowel sound present CNS: Alert, awake, oriented x 3 Psychiatry: Mood appropriate Extremities: No pedal edema, no calf tenderness  Data Review: I have personally reviewed the laboratory data and studies available.  F/u labs ordered Unresulted Labs (From admission, onward)     Start     Ordered   04/27/23 0500  Vitamin B12  (Anemia Panel (PNL))  Tomorrow morning,   R        04/26/23 0912   04/27/23 0500  Folate  (Anemia Panel (PNL))  Tomorrow morning,   R        04/26/23 0912   04/27/23 0500  Iron and TIBC  (Anemia Panel (PNL))  Tomorrow morning,   R        04/26/23 0912   04/27/23 0500  Ferritin  (Anemia Panel (PNL))  Tomorrow morning,  R        04/26/23 0912   04/27/23 0500  Reticulocytes  (Anemia Panel (PNL))  Tomorrow morning,   R        04/26/23 0912   04/27/23 0500  Phosphorus  Tomorrow morning,   R        04/26/23 1358   04/27/23 0500  Basic metabolic panel  Daily,   R      04/26/23 1358   04/27/23 0500  CBC with Differential/Platelet  Daily,   R      04/26/23 1358            Total time spent in review of labs and imaging, patient evaluation, formulation of plan, documentation and communication with family: 55 minutes  Signed, Lorin Glass, MD [facility] 04/26/2023

## 2023-04-26 NOTE — TOC Benefit Eligibility Note (Signed)
Patient Product/process development scientist completed.    The patient is insured through [facility]. Patient has Medicare and is not eligible for a copay card, but may be able to apply for patient assistance, if available.    Ran test claim for vancomycin 125 mg and the current 10 day co-pay is $20.37.   This test claim was processed through [facility]- copay amounts may vary at other pharmacies due to pharmacy/plan contracts, or as the patient moves through the different stages of their insurance plan.     Roland Earl, CPHT Pharmacy Technician III Certified Patient Advocate [facility] Patient Advocate Team Direct Number: [phone]  Fax: [phone]

## 2023-04-27 DIAGNOSIS — A0472 Enterocolitis due to Clostridium difficile, not specified as recurrent: Secondary | ICD-10-CM | POA: Diagnosis not present

## 2023-04-27 LAB — BASIC METABOLIC PANEL
Anion gap: 11 (ref 5–15)
BUN: 12 mg/dL (ref 8–23)
CO2: 18 mmol/L — ABNORMAL LOW (ref 22–32)
Calcium: 7.7 mg/dL — ABNORMAL LOW (ref 8.9–10.3)
Chloride: 107 mmol/L (ref 98–111)
Creatinine, Ser: 0.93 mg/dL (ref 0.44–1.00)
GFR, Estimated: >60 mL/min (ref >60)
Glucose, Bld: 88 mg/dL (ref 70–99)
Potassium: 4.2 mmol/L (ref 3.5–5.1)
Sodium: 136 mmol/L (ref 135–145)

## 2023-04-27 LAB — IRON AND TIBC
Iron: 27 ug/dL — ABNORMAL LOW (ref 28–170)
Saturation Ratios: 19 % (ref 10.4–31.8)
TIBC: 146 ug/dL — ABNORMAL LOW (ref 250–450)
UIBC: 119 ug/dL

## 2023-04-27 LAB — CBC WITH DIFFERENTIAL/PLATELET
Abs Immature Granulocytes: 0.02 10*3/uL (ref 0.00–0.07)
Basophils Absolute: 0 10*3/uL (ref 0.0–0.1)
Basophils Relative: 0 %
Eosinophils Absolute: 0.5 10*3/uL (ref 0.0–0.5)
Eosinophils Relative: 8 %
HCT: 27.5 % — ABNORMAL LOW (ref 36.0–46.0)
Hemoglobin: 9 g/dL — ABNORMAL LOW (ref 12.0–15.0)
Immature Granulocytes: 0 %
Lymphocytes Relative: 16 %
Lymphs Abs: 0.9 10*3/uL (ref 0.7–4.0)
MCH: 27.7 pg (ref 26.0–34.0)
MCHC: 32.7 g/dL (ref 30.0–36.0)
MCV: 84.6 fL (ref 80.0–100.0)
Monocytes Absolute: 0.3 10*3/uL (ref 0.1–1.0)
Monocytes Relative: 5 %
Neutro Abs: 3.7 10*3/uL (ref 1.7–7.7)
Neutrophils Relative %: 71 %
Platelets: 160 10*3/uL (ref 150–400)
RBC: 3.25 MIL/uL — ABNORMAL LOW (ref 3.87–5.11)
RDW: 13.1 % (ref 11.5–15.5)
WBC: 5.4 10*3/uL (ref 4.0–10.5)
nRBC: 0 % (ref 0.0–0.2)

## 2023-04-27 LAB — RETICULOCYTES
Immature Retic Fract: 13.8 % (ref 2.3–15.9)
RBC.: 3.25 MIL/uL — ABNORMAL LOW (ref 3.87–5.11)
Retic Count, Absolute: 23.1 10*3/uL (ref 19.0–186.0)
Retic Ct Pct: 0.7 % (ref 0.4–3.1)

## 2023-04-27 LAB — FOLATE: Folate: 7.8 ng/mL (ref >5.9)

## 2023-04-27 LAB — VITAMIN B12: Vitamin B-12: 2233 pg/mL — ABNORMAL HIGH (ref 180–914)

## 2023-04-27 LAB — PHOSPHORUS: Phosphorus: 1.7 mg/dL — ABNORMAL LOW (ref 2.5–4.6)

## 2023-04-27 LAB — FERRITIN: Ferritin: 115 ng/mL (ref 11–307)

## 2023-04-27 MED ORDER — K PHOS MONO-SOD PHOS DI & MONO 155-852-130 MG PO TABS
500.0000 mg | ORAL_TABLET | Freq: Two times a day (BID) | ORAL | Status: AC
  Administered 2023-04-27 (×2): 500 mg via ORAL
  Filled 2023-04-27 (×2): qty 2

## 2023-04-27 NOTE — Progress Notes (Signed)
PROGRESS NOTE  Jasmine Cantu  DOB: 28-Nov-1940  PCP: Laurann Montana, MD DGU:[id number]  DOA: 04/25/2023  LOS: 2 days  Hospital Day: 3  Brief narrative: Jasmine Cantu is a 82 y.o. 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 10/3, patient presented to the ED with complaint of fever, abdominal cramping, worsening of chronic diarrhea, nausea, poor appetite for 3 days leading to progressive weakness.  In the ED, patient was afebrile, heart rate normal, blood pressure in low 100s, Initial labs with WBC count 9.2, hemoglobin 12.1, sodium 133, potassium low at 2.7, BUN/creatinine 19/1.42, magnesium 1.5  CT abdomen/pelvis with and without contrast showed diffuse wall thickening along the colon with stranding consistent with diffuse colitis.  No obstruction or dilation.  Large hiatal hernia noted.  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 Stool studies obtained.  C. difficile antigen and toxin both positive.  Patient was started on oral vancomycin, IV fluid, IV electrolyte replacement Admitted to [facility]  Subjective: Patient was seen and examined this morning.   Pleasant elderly Caucasian female.   Lying on bed.  Daughter at bedside.  Continues to have diarrhea.  Had about 5-6 episodes in last 24 hours. Chart reviewed.  Remains afebrile and hemodynamically stable  Assessment and plan: C. difficile colitis Chronic diarrhea Presented with acute worsening of diarrhea that she has had for 3 months CT abdomen with diffuse colitis.  Stool assay with C. difficile antigen and toxin positive  Patient denies any recent use of antibiotics  Currently on a 10-day course of oral vancomycin  Since she continues to have 5-6 episode of diarrhea a  day, I will continue IV hydration IV Zofran for nausea Able to tolerate soft diet.   Hypokalemia/hypomagnesemia/hypophosphatemia: Low electrolytes sick secondary to GI loss.   Labs from this morning with phosphorus low at 1.7.  Replacement ordered. Recent Labs  Lab 04/25/23 0819 04/25/23 1057 04/26/23 0423 04/27/23 0728  K 2.7*  --  3.1* 4.2  MG  --  1.5* 1.9  --   PHOS  --   --   --  1.7*    Acute kidney injury Creatinine normal at baseline.  Presented with creatinine elevated 1.42 secondary to GI loss.  Improved with IV fluid.  Benazepril on hold. Recent Labs    04/25/23 0819 04/26/23 0423 04/27/23 0728  BUN 19 15 12   CREATININE 1.42* 0.98 0.93   Hypertension Benazepril on hold.  Continue to monitor blood pressure.  IV hydralazine as needed.  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   She had bowel ischemia and perforation in 2023 requiring partial resection. Doesn't f/u with vascular surgery. Not on any aspirin, statin.  I am wondering if the colitis in CT scan is actually ischemic colitis provide to high-grade IMA stenosis. Wondering if any acute intervention is required.  I sent a message to vascular surgery Dr. Lenell Antu on 10/4.  Pending response.  Chronic anemia H/o GERD, esophageal stricture H/o iron-deficiency, but B12 deficiency Baseline hemoglobin close to 10. Was hemoconcentrated at presentation.  Hemoglobin back to baseline. Anemia panel without iron or folic acid or vitamin B12 deficiency Recent Labs  04/25/23 0819 04/26/23 0423 04/27/23 0728  HGB 12.1 9.8* 9.0*  MCV 83.9 86.0 84.6  VITAMINB12  --   --  2,233*  FOLATE  --   --  7.8  FERRITIN  --   --  115  TIBC  --   --  146*  IRON  --   --  27*  RETICCTPCT  --   --  0.7   H/o invasive ductal carcinoma of right breast   s/p lumpectomy  Mobility: Encourage ambulation.  PT eval ordered  Goals of care   Code Status: Full Code     DVT prophylaxis:  heparin injection 5,000 Units Start: 04/25/23 2200   Antimicrobials: Oral vancomycin Fluid: NS at 100 mL to continue Consultants: Vascular surgery Family Communication: Daughter at bedside  Status: Inpatient Level of care:  Telemetry Medical   Patient is from: Home Needs to continue in-hospital care: Oral vancomycin, monitor for diarrhea frequency Anticipated d/c to: Pending clinical course    Diet:  Diet Order             DIET SOFT Room service appropriate? Yes; Fluid consistency: Thin  Diet effective now                   Scheduled Meds:  acetaminophen  1,000 mg Oral Once   heparin  5,000 Units Subcutaneous Q8H   phosphorus  500 mg Oral BID   sodium chloride flush  3 mL Intravenous Q12H   vancomycin  125 mg Oral QID    PRN meds: sodium chloride, acetaminophen **OR** acetaminophen, ondansetron **OR** ondansetron (ZOFRAN) IV   Infusions:   sodium chloride 10 mL/hr at 04/26/23 0946   sodium chloride 100 mL/hr at 04/26/23 1545    Antimicrobials: Anti-infectives (From admission, onward)    Start     Dose/Rate Route Frequency Ordered Stop   04/25/23 1815  vancomycin (VANCOCIN) capsule 125 mg        125 mg Oral 4 times daily 04/25/23 1727 05/05/23 1759   04/25/23 1600  piperacillin-tazobactam (ZOSYN) IVPB 3.375 g  Status:  Discontinued        3.375 g 12.5 mL/hr over 240 Minutes Intravenous Every 8 hours 04/25/23 1353 04/25/23 1646   04/25/23 0845  piperacillin-tazobactam (ZOSYN) IVPB 3.375 g        3.375 g 100 mL/hr over 30 Minutes Intravenous  Once 04/25/23 0839 04/25/23 1006       Objective: Vitals:   04/27/23 0300 04/27/23 0749  BP: (!) 127/50 (!) 132/48  Pulse: 61 62  Resp: 19 16  Temp: 98.2 F (36.8 C) 98.8 F (37.1 C)  SpO2: 98% 100%    Intake/Output Summary (Last 24 hours) at 04/27/2023 1158 Last data  filed at 04/27/2023 1059 Gross per 24 hour  Intake 324.47 ml  Output --  Net 324.47 ml   Filed Weights   04/25/23 0804  Weight: 60.6 kg   Weight change:  Body mass index is 23.68 kg/m.   Physical Exam: General exam: Pleasant, elderly Caucasian female.  Not in physical distress Skin: No rashes, lesions or ulcers. HEENT: Atraumatic, normocephalic, no obvious bleeding Lungs: Clear to auscultation laterally CVS: Regular rate and rhythm, no murmur GI/Abd soft, mild diffuse tenderness present, nondistended, bowel sound present CNS: Alert, awake, oriented x 3 Psychiatry: Mood appropriate Extremities: No pedal edema, no calf tenderness  Data Review: I have personally reviewed the laboratory data and studies available.  F/u labs ordered Unresulted Labs (From admission, onward)  Start     Ordered   04/27/23 0500  Basic metabolic panel  Daily,   R      04/26/23 1358   04/27/23 0500  CBC with Differential/Platelet  Daily,   R      04/26/23 1358            Total time spent in review of labs and imaging, patient evaluation, formulation of plan, documentation and communication with family: 45 minutes  Signed, Lorin Glass, MD [facility] 04/27/2023

## 2023-04-27 NOTE — Evaluation (Signed)
Physical Therapy Evaluation Patient Details Name: Jasmine Cantu MRN: [id number] DOB: [birth year] Today's Date: 04/27/2023  History of Present Illness  Pt is a 82 y/o female presenting on 10/3 with diarrhea, fatigue. Found with c difficile colitis, AKI. PMH includes: HTN, R breast cancer s/p lumpectomy, bowel ischemia with perforation s/p ex lap and small bowel resection 6/23, iron deficiency anemia.  Clinical Impression   Pt presents with [facility] strength, balance, gait, and activity tolerance for tasks assessed today. Pt ambulated >500 ft without AD and no physical assist, states she feels she is at baseline. PT encouraging mobility specialists to see pt while acute to maintain mobility, but no further acute or post-acute PT needs at this time. PT to sign off.           If plan is discharge home, recommend the following:     Can travel by private vehicle        Equipment Recommendations None recommended by PT  Recommendations for Other Services       Functional Status Assessment Patient has not had a recent decline in their functional status     Precautions / Restrictions Precautions Precautions: Fall Restrictions Weight Bearing Restrictions: No      Mobility  Bed Mobility Overal bed mobility: Needs Assistance Bed Mobility: Supine to Sit, Sit to Supine     Supine to sit: Supervision Sit to supine: Supervision   General bed mobility comments: for safety, increased time and PT watching lines/leads    Transfers Overall transfer level: Needs assistance Equipment used: None Transfers: Sit to/from Stand Sit to Stand: Supervision           General transfer comment: x2, from EOB and toilet. no physical assist    Ambulation/Gait Ambulation/Gait assistance: Supervision Gait Distance (Feet): 550 Feet Assistive device: None Gait Pattern/deviations: WFL(Within Functional Limits), Step-through pattern Gait velocity: wfl     General Gait Details: WFL speed and  balance  Stairs            Wheelchair Mobility     Tilt Bed    Modified Rankin (Stroke Patients Only)       Balance Overall balance assessment: Modified Independent                                           Pertinent Vitals/Pain Pain Assessment Pain Assessment: No/denies pain    Home Living Family/patient expects to be discharged to:: Private residence Living Arrangements: Alone Available Help at Discharge: Family Type of Home: House Home Access: Stairs to enter Entrance Stairs-Rails: Left Entrance Stairs-Number of Steps: 2   Home Layout: One level Home Equipment: Grab bars - toilet Additional Comments: son lives across the street, could have 24/7 support if needed from family    Prior Function Prior Level of Function : Independent/Modified Independent;Driving             Mobility Comments: no AD ADLs Comments: independent ADLs, IADLs, driving (family assists as needed/checks behind her)     Extremity/Trunk Assessment   Upper Extremity Assessment Upper Extremity Assessment: Defer to OT evaluation    Lower Extremity Assessment Lower Extremity Assessment: Overall WFL for tasks assessed    Cervical / Trunk Assessment Cervical / Trunk Assessment: Normal  Communication   Communication Communication: No apparent difficulties Cueing Techniques: Verbal cues;Gestural cues  Cognition Arousal: Alert Behavior During Therapy: WFL for tasks assessed/performed Overall Cognitive Status: Within  Functional Limits for tasks assessed                                          General Comments General comments (skin integrity, edema, etc.): daughter and son present    Exercises     Assessment/Plan    PT Assessment Patient does not need any further PT services  PT Problem List         PT Treatment Interventions      PT Goals (Current goals can be found in the Care Plan section)  Acute Rehab PT Goals PT Goal  Formulation: All assessment and education complete, DC therapy Time For Goal Achievement: 04/27/23 Potential to Achieve Goals: Good    Frequency       Co-evaluation               AM-PAC PT "6 Clicks" Mobility  Outcome Measure Help needed turning from your back to your side while in a flat bed without using bedrails?: None Help needed moving from lying on your back to sitting on the side of a flat bed without using bedrails?: None Help needed moving to and from a bed to a chair (including a wheelchair)?: None Help needed standing up from a chair using your arms (e.g., wheelchair or bedside chair)?: None Help needed to walk in hospital room?: A Little Help needed climbing 3-5 steps with a railing? : A Little 6 Click Score: 22    End of Session   Activity Tolerance: Patient tolerated treatment well Patient left: in bed;with call bell/phone within reach;with bed alarm set Nurse Communication: Mobility status PT Visit Diagnosis: Other abnormalities of gait and mobility (R26.89);Muscle weakness (generalized) (M62.81)    Time: 1205-1220 PT Time Calculation (min) (ACUTE ONLY): 15 min   Charges:   PT Evaluation $PT Eval Low Complexity: 1 Low   PT General Charges $$ ACUTE PT VISIT: 1 Visit         Marye Round, PT DPT Acute Rehabilitation Services Secure Chat Preferred  Office [phone]   Calab Sachse Sheliah Plane 04/27/2023, 12:48 PM

## 2023-04-28 DIAGNOSIS — A0472 Enterocolitis due to Clostridium difficile, not specified as recurrent: Secondary | ICD-10-CM | POA: Diagnosis not present

## 2023-04-28 LAB — BASIC METABOLIC PANEL
Anion gap: 10 (ref 5–15)
BUN: 7 mg/dL — ABNORMAL LOW (ref 8–23)
CO2: 18 mmol/L — ABNORMAL LOW (ref 22–32)
Calcium: 7.4 mg/dL — ABNORMAL LOW (ref 8.9–10.3)
Chloride: 108 mmol/L (ref 98–111)
Creatinine, Ser: 1.04 mg/dL — ABNORMAL HIGH (ref 0.44–1.00)
GFR, Estimated: 53 mL/min — ABNORMAL LOW (ref >60)
Glucose, Bld: 105 mg/dL — ABNORMAL HIGH (ref 70–99)
Potassium: 3.3 mmol/L — ABNORMAL LOW (ref 3.5–5.1)
Sodium: 136 mmol/L (ref 135–145)

## 2023-04-28 LAB — CBC WITH DIFFERENTIAL/PLATELET
Abs Immature Granulocytes: 0.03 10*3/uL (ref 0.00–0.07)
Basophils Absolute: 0 10*3/uL (ref 0.0–0.1)
Basophils Relative: 1 %
Eosinophils Absolute: 0.2 10*3/uL (ref 0.0–0.5)
Eosinophils Relative: 5 %
HCT: 27.7 % — ABNORMAL LOW (ref 36.0–46.0)
Hemoglobin: 9.2 g/dL — ABNORMAL LOW (ref 12.0–15.0)
Immature Granulocytes: 1 %
Lymphocytes Relative: 24 %
Lymphs Abs: 1 10*3/uL (ref 0.7–4.0)
MCH: 28.1 pg (ref 26.0–34.0)
MCHC: 33.2 g/dL (ref 30.0–36.0)
MCV: 84.7 fL (ref 80.0–100.0)
Monocytes Absolute: 0.3 10*3/uL (ref 0.1–1.0)
Monocytes Relative: 6 %
Neutro Abs: 2.6 10*3/uL (ref 1.7–7.7)
Neutrophils Relative %: 63 %
Platelets: 151 10*3/uL (ref 150–400)
RBC: 3.27 MIL/uL — ABNORMAL LOW (ref 3.87–5.11)
RDW: 13.1 % (ref 11.5–15.5)
WBC: 4.1 10*3/uL (ref 4.0–10.5)
nRBC: 0 % (ref 0.0–0.2)

## 2023-04-28 MED ORDER — MELATONIN 5 MG PO TABS: 5.0000 mg | ORAL_TABLET | Freq: Every evening | ORAL | Status: DC | PRN

## 2023-04-28 MED ORDER — POTASSIUM CHLORIDE CRYS ER 20 MEQ PO TBCR
40.0000 meq | EXTENDED_RELEASE_TABLET | Freq: Once | ORAL | Status: AC
  Administered 2023-04-28: 40 meq via ORAL
  Filled 2023-04-28: qty 2

## 2023-04-28 MED ORDER — CHOLESTYRAMINE LIGHT 4 G PO PACK
4.0000 g | PACK | Freq: Three times a day (TID) | ORAL | Status: DC
  Administered 2023-04-28 – 2023-04-29 (×4): 4 g via ORAL
  Filled 2023-04-28 (×6): qty 1

## 2023-04-28 MED ORDER — LOPERAMIDE HCL 2 MG PO CAPS: 2.0000 mg | ORAL_CAPSULE | Freq: Four times a day (QID) | ORAL | Status: DC | PRN

## 2023-04-28 NOTE — Progress Notes (Signed)
PROGRESS NOTE  Jasmine Cantu  DOB: [birth year]  PCP: Laurann Montana, MD WUJ:[id number]  DOA: 04/25/2023  LOS: 3 days  Hospital Day: 4  Brief narrative: Jasmine Cantu is a 82 y.o. 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 10/3, patient presented to the ED with complaint of fever, abdominal cramping, worsening of chronic diarrhea, nausea, poor appetite for 3 days leading to progressive weakness.  In the ED, patient was afebrile, heart rate normal, blood pressure in low 100s, Initial labs with WBC count 9.2, hemoglobin 12.1, sodium 133, potassium low at 2.7, BUN/creatinine 19/1.42, magnesium 1.5  CT abdomen/pelvis with and without contrast showed diffuse wall thickening along the colon with stranding consistent with diffuse colitis.  No obstruction or dilation.  Large hiatal hernia noted.  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 Stool studies obtained.  C. difficile antigen and toxin both positive.  Patient was started on oral vancomycin, IV fluid, IV electrolyte replacement Admitted to [facility]  Subjective: Patient was seen and examined this morning.   Pleasant elderly Caucasian female.   Lying on bed.  Continues to have abdominal tenderness.  Had 4-5 episodes of loose bowel movement in last 24 hours. Daughter at bedside.  Blood pressure stable.  WBC count normal.  No fever  Assessment and plan: C. difficile colitis Chronic diarrhea Presented with acute worsening of diarrhea that she has had for 3 months CT abdomen with diffuse colitis.  Stool assay with C. difficile antigen and toxin positive  Patient denies any recent use of antibiotics  Currently on a 10-day course of oral vancomycin  Vancomycin for last 2 days,  patient continues to have multiple episodes of loose motion daily.  No fever, WBC count normal I will add scheduled Questran and as needed Imodium today Continue IV hydration.  Does not seem to have evidence of volume overload IV Zofran for nausea Able to tolerate soft diet.   Hypokalemia/hypomagnesemia/hypophosphatemia: Low electrolytes sick secondary to GI loss.   Labs from this morning with phosphorus low at 1.7.  Replacement ordered. Recent Labs  Lab 04/25/23 0819 04/25/23 1057 04/26/23 0423 04/27/23 0728  K 2.7*  --  3.1* 4.2  MG  --  1.5* 1.9  --   PHOS  --   --   --  1.7*    Acute kidney injury Creatinine normal at baseline.  Presented with creatinine elevated 1.42 secondary to GI loss.  Improved with IV fluid.  Benazepril on hold. Recent Labs    04/25/23 0819 04/26/23 0423 04/27/23 0728  BUN 19 15 12   CREATININE 1.42* 0.98 0.93   Hypertension Benazepril on hold.  Continue to monitor blood pressure.  IV hydralazine as needed.  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   She had bowel ischemia and perforation in 2023 requiring partial resection. Doesn't f/u with vascular surgery. Not on any aspirin, statin.  I am wondering if the colitis in CT scan is actually ischemic colitis provide to high-grade IMA stenosis. Wondering if any acute intervention is required.  I sent a message to vascular surgery Dr. Lenell Antu on 10/4.  Pending response.  Chronic anemia H/o GERD, esophageal stricture  H/o iron-deficiency, but B12 deficiency Baseline hemoglobin close to 10. Was hemoconcentrated at presentation.  Hemoglobin back to baseline. Anemia panel without iron or folic acid or vitamin B12 deficiency Recent Labs    04/25/23 0819 04/26/23 0423 04/27/23 0728  HGB 12.1 9.8* 9.0*  MCV 83.9 86.0 84.6   VITAMINB12  --   --  2,233*  FOLATE  --   --  7.8  FERRITIN  --   --  115  TIBC  --   --  146*  IRON  --   --  27*  RETICCTPCT  --   --  0.7   H/o invasive ductal carcinoma of right breast  s/p lumpectomy  Mobility: Encourage ambulation.  PT eval obtained.  No follow-up recommended  Goals of care   Code Status: Full Code     DVT prophylaxis:  heparin injection 5,000 Units Start: 04/25/23 2200   Antimicrobials: Oral vancomycin Fluid: NS at 100 mL to continue Consultants: Vascular surgery paged Family Communication: Daughter at bedside  Status: Inpatient Level of care:  Telemetry Medical   Patient is from: Home Needs to continue in-hospital care: Oral vancomycin, monitor for diarrhea frequency Anticipated d/c to: Pending clinical course    Diet:  Diet Order             DIET SOFT Room service appropriate? Yes; Fluid consistency: Thin  Diet effective now                   Scheduled Meds:  acetaminophen  1,000 mg Oral Once   cholestyramine light  4 g Oral TID   heparin  5,000 Units Subcutaneous Q8H   sodium chloride flush  3 mL Intravenous Q12H   vancomycin  125 mg Oral QID    PRN meds: sodium chloride, acetaminophen **OR** acetaminophen, loperamide, ondansetron **OR** ondansetron (ZOFRAN) IV   Infusions:   sodium chloride 10 mL/hr at 04/26/23 0946   sodium chloride 100 mL/hr at 04/26/23 1545    Antimicrobials: Anti-infectives (From admission, onward)    Start     Dose/Rate Route Frequency Ordered Stop   04/25/23 1815  vancomycin (VANCOCIN) capsule 125 mg        125 mg Oral 4 times daily 04/25/23 1727 05/05/23 1759   04/25/23 1600  piperacillin-tazobactam (ZOSYN) IVPB 3.375 g  Status:  Discontinued        3.375 g 12.5 mL/hr over 240 Minutes Intravenous Every 8 hours 04/25/23 1353 04/25/23 1646   04/25/23 0845  piperacillin-tazobactam (ZOSYN) IVPB 3.375 g        3.375 g 100 mL/hr over 30 Minutes Intravenous  Once 04/25/23 0839 04/25/23 1006        Objective: Vitals:   04/28/23 0638 04/28/23 0737  BP: (!) 139/50 (!) 132/49  Pulse: (!) 56 60  Resp:  17  Temp: 98.1 F (36.7 C) 98.2 F (36.8 C)  SpO2: 97% 98%    Intake/Output Summary (Last 24 hours) at 04/28/2023 1055 Last data filed at 04/28/2023 0900 Gross per 24 hour  Intake 243 ml  Output --  Net 243 ml   Filed Weights   04/25/23 0804  Weight: 60.6 kg   Weight change:  Body mass index is 23.68 kg/m.   Physical Exam: General exam: Pleasant, elderly Caucasian female.  Not in physical distress Skin: No rashes, lesions or ulcers. HEENT: Atraumatic, normocephalic, no obvious bleeding Lungs: Clear to auscultation laterally CVS: Regular rate and rhythm, no murmur GI/Abd soft, mild diffuse tenderness present, nondistended, bowel sound  present CNS: Alert, awake, oriented x 3 Psychiatry: Mood appropriate Extremities: No pedal edema, no calf tenderness.  No evidence of volume overload  Data Review: I have personally reviewed the laboratory data and studies available.  F/u labs ordered Unresulted Labs (From admission, onward)     Start     Ordered   04/29/23 0500  Lipid panel  Tomorrow morning,   R        04/28/23 0843   04/27/23 0500  Basic metabolic panel  Daily,   R      04/26/23 1358   04/27/23 0500  CBC with Differential/Platelet  Daily,   R      04/26/23 1358            Total time spent in review of labs and imaging, patient evaluation, formulation of plan, documentation and communication with family: 45 minutes  Signed, Lorin Glass, MD [facility] 04/28/2023

## 2023-04-29 ENCOUNTER — Other Ambulatory Visit (HOSPITAL_COMMUNITY): Payer: Self-pay

## 2023-04-29 DIAGNOSIS — A0472 Enterocolitis due to Clostridium difficile, not specified as recurrent: Secondary | ICD-10-CM | POA: Diagnosis not present

## 2023-04-29 LAB — CBC WITH DIFFERENTIAL/PLATELET
Abs Immature Granulocytes: 0.07 10*3/uL (ref 0.00–0.07)
Basophils Absolute: 0 10*3/uL (ref 0.0–0.1)
Basophils Relative: 1 %
Eosinophils Absolute: 0.2 10*3/uL (ref 0.0–0.5)
Eosinophils Relative: 6 %
HCT: 25.9 % — ABNORMAL LOW (ref 36.0–46.0)
Hemoglobin: 8.6 g/dL — ABNORMAL LOW (ref 12.0–15.0)
Immature Granulocytes: 2 %
Lymphocytes Relative: 23 %
Lymphs Abs: 1 10*3/uL (ref 0.7–4.0)
MCH: 28.8 pg (ref 26.0–34.0)
MCHC: 33.2 g/dL (ref 30.0–36.0)
MCV: 86.6 fL (ref 80.0–100.0)
Monocytes Absolute: 0.3 10*3/uL (ref 0.1–1.0)
Monocytes Relative: 7 %
Neutro Abs: 2.6 10*3/uL (ref 1.7–7.7)
Neutrophils Relative %: 61 %
Platelets: 151 10*3/uL (ref 150–400)
RBC: 2.99 MIL/uL — ABNORMAL LOW (ref 3.87–5.11)
RDW: 13.3 % (ref 11.5–15.5)
WBC: 4.2 10*3/uL (ref 4.0–10.5)
nRBC: 0 % (ref 0.0–0.2)

## 2023-04-29 LAB — BASIC METABOLIC PANEL
Anion gap: 8 (ref 5–15)
BUN: 8 mg/dL (ref 8–23)
CO2: 17 mmol/L — ABNORMAL LOW (ref 22–32)
Calcium: 7.5 mg/dL — ABNORMAL LOW (ref 8.9–10.3)
Chloride: 112 mmol/L — ABNORMAL HIGH (ref 98–111)
Creatinine, Ser: 1.06 mg/dL — ABNORMAL HIGH (ref 0.44–1.00)
GFR, Estimated: 52 mL/min — ABNORMAL LOW (ref >60)
Glucose, Bld: 98 mg/dL (ref 70–99)
Potassium: 3.8 mmol/L (ref 3.5–5.1)
Sodium: 137 mmol/L (ref 135–145)

## 2023-04-29 LAB — LIPID PANEL
Cholesterol: 98 mg/dL (ref 0–200)
HDL: 30 mg/dL — ABNORMAL LOW (ref >40)
LDL Cholesterol: 47 mg/dL (ref 0–99)
Total CHOL/HDL Ratio: 3.3 {ratio}
Triglycerides: 106 mg/dL (ref <150)
VLDL: 21 mg/dL (ref 0–40)

## 2023-04-29 MED ORDER — CHOLESTYRAMINE 4 G PO PACK
4.0000 g | PACK | Freq: Three times a day (TID) | ORAL | 0 refills | Status: DC
Start: 2023-04-29 — End: 2023-08-08
  Filled 2023-04-29 (×2): qty 21, 7d supply, fill #0

## 2023-04-29 MED ORDER — VANCOMYCIN HCL 125 MG PO CAPS
125.0000 mg | ORAL_CAPSULE | Freq: Four times a day (QID) | ORAL | 0 refills | Status: AC
  Filled 2023-04-29: qty 24, 6d supply, fill #0

## 2023-04-29 MED ORDER — ASPIRIN 81 MG PO CHEW
81.0000 mg | CHEWABLE_TABLET | Freq: Every day | ORAL | Status: DC
  Administered 2023-04-29: 81 mg via ORAL
  Filled 2023-04-29: qty 1

## 2023-04-29 MED ORDER — ASPIRIN 81 MG PO CHEW
81.0000 mg | CHEWABLE_TABLET | Freq: Every day | ORAL | 0 refills | Status: AC
  Filled 2023-04-29: qty 30, 30d supply, fill #0

## 2023-04-29 NOTE — Discharge Summary (Signed)
Physician Discharge Summary  Jasmine Cantu NFA:[id number] DOB: 04-29-1941 DOA: 04/25/2023  PCP: Laurann Montana, MD  Admit date: 04/25/2023 Discharge date: 04/29/2023  Admitted From: Home Discharge disposition: Home  Recommendations at discharge:  Complete the 10 days course of vancomycin Questran for diarrhea as needed You been started on low-dose aspirin 80 mg daily Follow-up with PCP as an outpatient within 1 to 2 weeks for repeat blood work   Brief narrative: Jasmine Cantu is a 82 y.o. 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 10/3, patient presented to the ED with complaint of fever, abdominal cramping, worsening of chronic diarrhea, nausea, poor appetite for 3 days leading to progressive weakness.  In the ED, patient was afebrile, heart rate normal, blood pressure in low 100s, Initial labs with WBC count 9.2, hemoglobin 12.1, sodium 133, potassium low at 2.7, BUN/creatinine 19/1.42, magnesium 1.5  CT abdomen/pelvis with and without contrast showed diffuse wall thickening along the colon with stranding consistent with diffuse colitis.  No obstruction or dilation.  Large hiatal hernia noted.  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 Stool studies obtained.  C. difficile antigen and toxin both positive.  Patient was started on oral vancomycin, IV fluid, IV electrolyte replacement Admitted to [facility]  Subjective: Patient was seen and examined this morning.   Pleasant elderly Caucasian female.   Lying on bed.  Abdominal tenderness improving.  Bowel movement less frequent and soft.  2-3 in last 24 hours.  Multiple family members at bedside.  Patient and family feel comfortable with discharge today.  Assessment and  plan: C. difficile colitis Chronic diarrhea Presented with acute worsening of diarrhea that she has had for 3 months CT abdomen with diffuse colitis.  Stool assay with C. difficile antigen and toxin positive  Patient denies any recent use of antibiotics  Patient was started on a 10-day course of oral vancomycin. 10/6, Questran schedule was also added Abdominal tenderness improving.  Bowel movement less frequent and soft.  2-3 in last 24 hours.   No fever, WBC count normal Able to tolerate soft diet.  Advanced to regular consistency diet at home   Hypokalemia/hypomagnesemia/hypophosphatemia: Electrolytes were low because of GI loss.  Replacement given Recent Labs  Lab 04/25/23 0819 04/25/23 1057 04/26/23 0423 04/27/23 0728 04/28/23 1008 04/29/23 0731  K 2.7*  --  3.1* 4.2 3.3* 3.8  MG  --  1.5* 1.9  --   --   --   PHOS  --   --   --  1.7*  --   --     Acute kidney injury Creatinine normal at baseline.  Presented with creatinine elevated 1.42 secondary to GI loss.  Improved with IV fluid.   Recent Labs    04/25/23 0819 04/26/23 0423 04/27/23 0728 04/28/23 1008 04/29/23 0731  BUN 19 15 12  7* 8  CREATININE 1.42* 0.98 0.93 1.04* 1.06*   Hypertension Benazepril to resume postdischarge.    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   She had bowel ischemia and perforation in 2023 requiring partial resection. Doesn't f/u with vascular surgery. Not on any aspirin, statin.  10/7, I discussed the  CT scan findings with vascular surgeon Dr. Sherral Hammers.  SMA is patent.  No surgical indication for IMA stenosis.  Started the patient on aspirin 81 mg daily.  Lipid panel with well-controlled LDL at 47.  No need of statin.  Chronic anemia H/o GERD, esophageal stricture H/o iron-deficiency, but B12  deficiency Baseline hemoglobin close to 10. Was hemoconcentrated at presentation.  Hemoglobin back to baseline. Anemia panel without iron or folic acid or vitamin B12 deficiency Recent Labs    04/25/23 0819 04/26/23 0423 04/27/23 0728 04/28/23 1008 04/29/23 0731  HGB 12.1 9.8* 9.0* 9.2* 8.6*  MCV 83.9 86.0 84.6 84.7 86.6  VITAMINB12  --   --  2,233*  --   --   FOLATE  --   --  7.8  --   --   FERRITIN  --   --  115  --   --   TIBC  --   --  146*  --   --   IRON  --   --  27*  --   --   RETICCTPCT  --   --  0.7  --   --    H/o invasive ductal carcinoma of right breast  s/p lumpectomy Continue tamoxifen.  Follows up with Dr. Al Pimple.  Mobility: Encourage ambulation.  PT eval obtained.  No follow-up recommended  Goals of care   Code Status: Full Code   Wounds:  - Wound / Incision (Open or Dehisced) 04/25/23 Skin tear Hand Posterior;Right skin tear, flap present (Active)  Date First Assessed/Time First Assessed: 04/25/23 1614   Wound Type: (c) Skin tear  Location: Hand  Location Orientation: Posterior;Right  Wound Description (Comments): skin tear, flap present  Present on Admission: Yes    Assessments 04/25/2023  9:08 PM 04/29/2023  7:33 AM  Dressing Type None Foam - Lift dressing to assess site every shift  Dressing Status -- Clean, Dry, Intact  Drainage Amount None --     No associated orders.    Discharge Exam:   Vitals:   04/28/23 1639 04/28/23 2014 04/29/23 0401 04/29/23 1008  BP: (!) 147/52 (!) 144/47 (!) 159/54 (!) 150/58  Pulse: (!) 56 (!) 58 92 (!) 57  Resp: 17 16 16 17   Temp: 98.2 F (36.8 C) 98.4 F (36.9 C) 98.5 F (36.9 C) 97.7 F (36.5 C)  TempSrc:  Oral  Oral  SpO2: 99% 97% 99% 99%  Weight:      Height:        Body mass index is 23.68 kg/m.   General exam: Pleasant, elderly Caucasian female.  Not in physical distress Skin: No rashes, lesions or ulcers. HEENT: Atraumatic, normocephalic, no obvious bleeding Lungs: Clear to auscultation  laterally CVS: Regular rate and rhythm, no murmur GI/Abd soft, mild diffuse present but improving, nondistended, bowel sound present CNS: Alert, awake, oriented x 3 Psychiatry: Mood appropriate Extremities: No pedal edema, no calf tenderness.  No evidence of volume overload  Follow ups:    Follow-up Information     Laurann Montana, MD Follow up.   Specialty: Family Medicine Contact information: [phone] [place] [address] [phone]                 Discharge Instructions:   Discharge Instructions     Call MD for:  difficulty breathing, headache or visual disturbances   Complete by: As directed    Call MD for:  extreme fatigue   Complete by: As directed    Call  MD for:  hives   Complete by: As directed    Call MD for:  persistant dizziness or light-headedness   Complete by: As directed    Call MD for:  persistant nausea and vomiting   Complete by: As directed    Call MD for:  severe uncontrolled pain   Complete by: As directed    Call MD for:  temperature >100.4   Complete by: As directed    Diet general   Complete by: As directed    Discharge instructions   Complete by: As directed    Recommendations at discharge:   Complete the 10 days course of vancomycin  Questran for diarrhea as needed  You been started on low-dose aspirin 80 mg daily  Follow-up with PCP as an outpatient within 1 to 2 weeks for repeat blood work  General discharge instructions: Follow with Primary MD Laurann Montana, MD in 7 days  Please request your PCP  to go over your hospital tests, procedures, radiology results at the follow up. Please get your medicines reviewed and adjusted.  Your PCP may decide to repeat certain labs or tests as needed. Do not drive, operate heavy machinery, perform activities at heights, swimming or participation in water activities or provide baby sitting services if your were admitted for syncope or siezures until you have seen by  Primary MD or a Neurologist and advised to do so again. [place] Controlled Substance Reporting System database was reviewed. Do not drive, operate heavy machinery, perform activities at heights, swim, participate in water activities or provide baby-sitting services while on medications for pain, sleep and mood until your outpatient physician has reevaluated you and advised to do so again.  You are strongly recommended to comply with the dose, frequency and duration of prescribed medications. Activity: As tolerated with Full fall precautions use walker/cane & assistance as needed Avoid using any recreational substances like cigarette, tobacco, alcohol, or non-prescribed drug. If you experience worsening of your admission symptoms, develop shortness of breath, life threatening emergency, suicidal or homicidal thoughts you must seek medical attention immediately by calling 911 or calling your MD immediately  if symptoms less severe. You must read complete instructions/literature along with all the possible adverse reactions/side effects for all the medicines you take and that have been prescribed to you. Take any new medicine only after you have completely understood and accepted all the possible adverse reactions/side effects.  Wear Seat belts while driving. You were cared for by a hospitalist during your hospital stay. If you have any questions about your discharge medications or the care you received while you were in the hospital after you are discharged, you can call the unit and ask to speak with the hospitalist or the covering physician. Once you are discharged, your primary care physician will handle any further medical issues. Please note that NO REFILLS for any discharge medications will be authorized once you are discharged, as it is imperative that you return to your primary care physician (or establish a relationship with a primary care physician if you do not have one).   Discharge wound  care:   Complete by: As directed    Increase activity slowly   Complete by: As directed        Discharge Medications:   Allergies as of 04/29/2023       Reactions   Doxycycline Nausea And Vomiting   Codeine Other (See Comments)   GI upset   Hydrocodone Nausea Only  Medication List     TAKE these medications    acetaminophen 500 MG tablet Commonly known as: TYLENOL Take 500 mg by mouth every 6 (six) hours as needed for mild pain.   aspirin 81 MG chewable tablet Chew 1 tablet (81 mg total) by mouth daily. Start taking on: April 30, 2023   benazepril 10 MG tablet Commonly known as: LOTENSIN Take 10 mg by mouth daily.   calcium carbonate 500 MG chewable tablet Commonly known as: TUMS - dosed in mg elemental calcium Chew 1 tablet by mouth daily.   cholestyramine light 4 g packet Commonly known as: PREVALITE Take 1 packet (4 g total) by mouth 3 (three) times daily for 7 days.   cyanocobalamin 1000 MCG/ML injection Commonly known as: VITAMIN B12 Inject 1,000 mcg into the muscle every 30 (thirty) days.   tamoxifen 20 MG tablet Commonly known as: NOLVADEX Take 1 tablet (20 mg total) by mouth daily.   traMADol 50 MG tablet Commonly known as: ULTRAM Take 1-2 tablets (50-100 mg total) by mouth every 6 (six) hours as needed for moderate pain or severe pain. What changed:  how much to take Another medication with the same name was removed. Continue taking this medication, and follow the directions you see here.   vancomycin 125 MG capsule Commonly known as: VANCOCIN Take 1 capsule (125 mg total) by mouth 4 (four) times daily for 6 days.   Vitamin D3 50 MCG (2000 UT) capsule Take 200 Units by mouth daily.               Discharge Care Instructions  (From admission, onward)           Start     Ordered   04/29/23 0000  Discharge wound care:        04/29/23 1306             The results of significant diagnostics from this  hospitalization (including imaging, microbiology, ancillary and laboratory) are listed below for reference.    Procedures and Diagnostic Studies:   CT Angio Abd/Pel W and/or Wo Contrast  Result Date: 04/25/2023 CLINICAL DATA:  Diarrhea, fever nausea with decreased appetite. Cramping and bowel leakage. EXAM: CTA ABDOMEN AND PELVIS WITHOUT AND WITH CONTRAST TECHNIQUE: Multidetector CT imaging of the abdomen and pelvis was performed using the standard protocol during bolus administration of intravenous contrast. Multiplanar reconstructed images and MIPs were obtained and reviewed to evaluate the vascular anatomy. RADIATION DOSE REDUCTION: This exam was performed according to the departmental dose-optimization program which includes automated exposure control, adjustment of the mA and/or kV according to patient size and/or use of iterative reconstruction technique. CONTRAST:  75mL OMNIPAQUE IOHEXOL 350 MG/ML SOLN COMPARISON:  None Available. FINDINGS: VASCULAR Aorta: Scattered vascular calcifications. No dissection or aneurysm formation. Celiac: Mild calcified plaque at the origin without significant stenosis. Of note there is a replaced left hepatic artery from the left gastric. Tortuous course of the splenic artery. SMA: Patent without evidence of aneurysm, dissection, vasculitis or significant stenosis. Renals: Single right main renal artery with some moderate calcified plaque at the origin. There several left-sided separate renal arteries. At least 3 are identified. IMA: Ostial high-grade stenosis with some post stenotic dilatation. Inflow: Mild atherosclerotic calcified plaque along the iliac vessels. Proximal Outflow: Common femoral arteries and visualized SFA vessels has some minimal disease. Veins: No obvious venous abnormality within the limitations of this arterial phase study. Review of the MIP images confirms the above findings. NON-VASCULAR Lower chest: Large  hiatal hernia.  No pleural effusion lung  bases. Hepatobiliary: With the limits of the early phase of the arterial bolus, grossly no clear space-occupying liver lesion. Previous cholecystectomy. Pancreas: Mild global pancreatic atrophy.  No obvious mass Spleen: Normal in size without focal abnormality.  Splenule. Adrenals/Urinary Tract: Nodular thickening of the medial limb of the right adrenal gland. Is also thickening of the inferior aspect of the left adrenal gland measuring 15 x 14 mm. Not clearly an adenoma. Moderate atrophy of the right kidney with some punctate calcifications. Parapelvic left-sided renal cysts are identified. The ureters have normal course and caliber extending down to the bladder. Preserved contours of the urinary bladder. Stomach/Bowel: Again large hiatal hernia. The small bowel is nondilated. The large bowel is also nondilated but has diffuse wall thickening with stranding consistent with a pancolitis. Left-sided colonic diverticula are also seen. There are some surgical changes along loops of small bowel in the left upper abdomen. Lymphatic: No specific abnormal lymph node enlargement identified in the abdomen and pelvis. Reproductive: Status post hysterectomy. No adnexal masses. Other: No free intra-abdominal air. No rim enhancing fluid collections. Musculoskeletal: Transitional lumbosacral segment. Scattered degenerative changes of the spine and pelvis. IMPRESSION: VASCULAR Scattered vascular calcifications. 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 NON-VASCULAR Diffuse wall thickening along the colon with stranding consistent with a diffuse colitis. No obstruction or dilatation. Scattered colonic diverticula Bilateral adrenal nodularity. Not clearly adenomas. Recommend dedicated workup when appropriate. Large hiatal hernia. Electronically Signed   By: Karen Kays M.D.   On: 04/25/2023 12:31     Labs:   Basic  Metabolic Panel: Recent Labs  Lab 04/25/23 0819 04/25/23 1057 04/26/23 0423 04/27/23 0728 04/28/23 1008 04/29/23 0731  NA 133*  --  132* 136 136 137  K 2.7*  --  3.1* 4.2 3.3* 3.8  CL 101  --  103 107 108 112*  CO2 21*  --  20* 18* 18* 17*  GLUCOSE 143*  --  112* 88 105* 98  BUN 19  --  15 12 7* 8  CREATININE 1.42*  --  0.98 0.93 1.04* 1.06*  CALCIUM 8.0*  --  7.4* 7.7* 7.4* 7.5*  MG  --  1.5* 1.9  --   --   --   PHOS  --   --   --  1.7*  --   --    GFR Estimated Creatinine Clearance: 32.7 mL/min (A) (by C-G formula based on SCr of 1.06 mg/dL (H)). Liver Function Tests: Recent Labs  Lab 04/25/23 0819  AST 31  ALT 38  ALKPHOS 56  BILITOT 1.0  PROT 5.6*  ALBUMIN 2.8*   Recent Labs  Lab 04/25/23 0819  LIPASE 20   No results for input(s): "AMMONIA" in the last 168 hours. Coagulation profile No results for input(s): "INR", "PROTIME" in the last 168 hours.  CBC: Recent Labs  Lab 04/25/23 0819 04/26/23 0423 04/27/23 0728 04/28/23 1008 04/29/23 0731  WBC 9.2 6.3 5.4 4.1 4.2  NEUTROABS 8.0*  --  3.7 2.6 2.6  HGB 12.1 9.8* 9.0* 9.2* 8.6*  HCT 35.5* 29.4* 27.5* 27.7* 25.9*  MCV 83.9 86.0 84.6 84.7 86.6  PLT 205 157 160 151 151   Cardiac Enzymes: No results for input(s): "CKTOTAL", "CKMB", "CKMBINDEX", "TROPONINI" in the last 168 hours. BNP: Invalid input(s): "POCBNP" CBG: No results for input(s): "GLUCAP" in the last 168 hours. D-Dimer No results for  input(s): "DDIMER" in the last 72 hours. Hgb A1c No results for input(s): "HGBA1C" in the last 72 hours. Lipid Profile Recent Labs    04/29/23 0731  CHOL 98  HDL 30*  LDLCALC 47  TRIG 782  CHOLHDL 3.3   Thyroid function studies No results for input(s): "TSH", "T4TOTAL", "T3FREE", "THYROIDAB" in the last 72 hours.  Invalid input(s): "FREET3" Anemia work up Recent Labs    04/27/23 0728  VITAMINB12 2,233*  FOLATE 7.8  FERRITIN 115  TIBC 146*  IRON 27*  RETICCTPCT 0.7   Microbiology Recent  Results (from the past 240 hour(s))  SARS Coronavirus 2 by RT PCR (hospital order, performed in [facility] hospital lab) *cepheid single result test* Anterior Nasal Swab     Status: None   Collection Time: 04/25/23  8:19 AM   Specimen: Anterior Nasal Swab  Result Value Ref Range Status   SARS Coronavirus 2 by RT PCR NEGATIVE NEGATIVE Final    Comment: (NOTE) SARS-CoV-2 target nucleic acids are NOT DETECTED.  The SARS-CoV-2 RNA is generally detectable in upper and lower respiratory specimens during the acute phase of infection. The lowest concentration of SARS-CoV-2 viral copies this assay can detect is 250 copies / mL. A negative result does not preclude SARS-CoV-2 infection and should not be used as the sole basis for treatment or other patient management decisions.  A negative result may occur with improper specimen collection / handling, submission of specimen other than nasopharyngeal swab, presence of viral mutation(s) within the areas targeted by this assay, and inadequate number of viral copies (<250 copies / mL). A negative result must be combined with clinical observations, patient history, and epidemiological information.  Fact Sheet for Patients:   RoadLapTop.co.za  Fact Sheet for Healthcare Providers: http://kim-miller.com/  This test is not yet approved or  cleared by the [place] FDA and has been authorized for detection and/or diagnosis of SARS-CoV-2 by FDA under an Emergency Use Authorization (EUA).  This EUA will remain in effect (meaning this test can be used) for the duration of the COVID-19 declaration under Section 564(b)(1) of the Act, 21 U.S.C. section 360bbb-3(b)(1), unless the authorization is terminated or revoked sooner.  Performed at [facility], [address] [place], [place] [address]   Blood culture (routine x 2)     Status: None (Preliminary result)   Collection Time: 04/25/23  8:20  AM   Specimen: BLOOD  Result Value Ref Range Status   Specimen Description   Final    BLOOD RIGHT ANTECUBITAL Performed at [facility], [address] [place], [place] [address]    Special Requests   Final    BOTTLES DRAWN AEROBIC AND ANAEROBIC Blood Culture results may not be optimal due to an excessive volume of blood received in culture bottles Performed at [facility]: [address], [address] [place], [place] [address]    Culture   Final    NO GROWTH 4 DAYS Performed at [facility], [address] [place], [place] [address]    Report Status PENDING  Incomplete  Blood culture (routine x 2)     Status: None (Preliminary result)   Collection Time: 04/25/23  9:10 AM   Specimen: BLOOD  Result Value Ref Range Status   Specimen Description   Final    BLOOD LEFT ANTECUBITAL Performed at [facility], [address], [place], [place] [address]    Special Requests   Final  BOTTLES DRAWN AEROBIC AND ANAEROBIC Blood Culture adequate volume Performed at [facility], [address] [place], [place] [address]    Culture   Final    NO GROWTH 4 DAYS Performed at [facility], [address] [place], [place] [address]    Report Status PENDING  Incomplete  Gastrointestinal Panel by PCR , Stool     Status: None   Collection Time: 04/25/23  9:54 AM   Specimen: Stool  Result Value Ref Range Status   Campylobacter species NOT DETECTED NOT DETECTED Final   Plesimonas shigelloides NOT DETECTED NOT DETECTED Final   Salmonella species NOT DETECTED NOT DETECTED Final   Yersinia enterocolitica NOT DETECTED NOT DETECTED Final   Vibrio species NOT DETECTED NOT DETECTED Final   Vibrio cholerae NOT DETECTED NOT DETECTED Final   Enteroaggregative E coli (EAEC) NOT DETECTED NOT DETECTED Final   Enteropathogenic E coli (EPEC) NOT DETECTED NOT DETECTED Final   Enterotoxigenic E coli (ETEC) NOT DETECTED NOT DETECTED Final   Shiga like toxin  producing E coli (STEC) NOT DETECTED NOT DETECTED Final   Shigella/Enteroinvasive E coli (EIEC) NOT DETECTED NOT DETECTED Final   Cryptosporidium NOT DETECTED NOT DETECTED Final   Cyclospora cayetanensis NOT DETECTED NOT DETECTED Final   Entamoeba histolytica NOT DETECTED NOT DETECTED Final   Giardia lamblia NOT DETECTED NOT DETECTED Final   Adenovirus F40/41 NOT DETECTED NOT DETECTED Final   Astrovirus NOT DETECTED NOT DETECTED Final   Norovirus GI/GII NOT DETECTED NOT DETECTED Final   Rotavirus A NOT DETECTED NOT DETECTED Final   Sapovirus (I, II, IV, and V) NOT DETECTED NOT DETECTED Final    Comment: Performed at [facility], [address] [place], [place] [address]  C Difficile Quick Screen w PCR reflex     Status: Abnormal   Collection Time: 04/25/23  9:54 AM   Specimen: STOOL  Result Value Ref Range Status   C Diff antigen POSITIVE (A) NEGATIVE Final   C Diff toxin POSITIVE (A) NEGATIVE Final   C Diff interpretation Toxin producing C. difficile detected.  Final    Comment: Performed at [facility], [address], [place], [place] [address]    Time coordinating discharge: 45 minutes  Signed: Shellee Streng  [facility] 04/29/2023, 1:06 PM

## 2023-04-29 NOTE — Progress Notes (Signed)
Occupational Therapy Treatment Patient Details Name: Jasmine Cantu MRN: [id number] DOB: 02/27/1941 Today's Date: 04/29/2023   History of present illness Pt is a 82 y/o female presenting on 10/3 with diarrhea, fatigue. Found with c difficile colitis, AKI. PMH includes: HTN, R breast cancer s/p lumpectomy, bowel ischemia with perforation s/p ex lap and small bowel resection 6/23, iron deficiency anemia.   OT comments  Patient seated in recliner and reports plan to dc home today.  Much steadier today, ambulating around room with independence.  Completing ADLs with independence.  Discussed energy conservation and activity progression. Pt will have good support at home for IADLs.  OT will sign off.  No further needs identified.       If plan is discharge home, recommend the following:  Assist for transportation;Assistance with cooking/housework   Equipment Recommendations  None recommended by OT    Recommendations for Other Services      Precautions / Restrictions Precautions Precautions: Fall Restrictions Weight Bearing Restrictions: No       Mobility Bed Mobility               General bed mobility comments: OOB in recliner    Transfers Overall transfer level: Independent                       Balance Overall balance assessment: Modified Independent                                         ADL either performed or assessed with clinical judgement   ADL Overall ADL's : Independent                                            Extremity/Trunk Assessment              Vision       Perception     Praxis      Cognition Arousal: Alert Behavior During Therapy: WFL for tasks assessed/performed Overall Cognitive Status: Within Functional Limits for tasks assessed                                          Exercises      Shoulder Instructions       General Comments daughter at side and supportive,  discussed energy conservation and activity progression    Pertinent Vitals/ Pain       Pain Assessment Pain Assessment: No/denies pain  Home Living                                          Prior Functioning/Environment              Frequency  Min 1X/week        Progress Toward Goals  OT Goals(current goals can now be found in the care plan section)  Progress towards OT goals: Goals met/education completed, patient discharged from OT  Acute Rehab OT Goals Patient Stated Goal: home today OT Goal Formulation: With patient  Plan      Co-evaluation  AM-PAC OT "6 Clicks" Daily Activity     Outcome Measure   Help from another person eating meals?: None Help from another person taking care of personal grooming?: None Help from another person toileting, which includes using toliet, bedpan, or urinal?: None Help from another person bathing (including washing, rinsing, drying)?: None Help from another person to put on and taking off regular upper body clothing?: None Help from another person to put on and taking off regular lower body clothing?: None 6 Click Score: 24    End of Session    OT Visit Diagnosis: Other abnormalities of gait and mobility (R26.89)   Activity Tolerance Patient tolerated treatment well   Patient Left in chair;with call bell/phone within reach;with family/visitor present   Nurse Communication Mobility status        Time: 1206-1221 OT Time Calculation (min): 15 min  Charges: OT General Charges $OT Visit: 1 Visit OT Treatments $Self Care/Home Management : 8-22 mins  Barry Brunner, OT Acute Rehabilitation Services Office [phone]   Chancy Milroy 04/29/2023, 1:16 PM

## 2023-04-29 NOTE — Care Management Important Message (Signed)
Important Message  Patient Details  Name: Jasmine Cantu MRN: [id number] Date of Birth: 1940/10/04   Important Message Given:  Yes - Medicare IM     Sherilyn Banker 04/29/2023, 2:22 PM

## 2023-04-30 LAB — CULTURE, BLOOD (ROUTINE X 2)
Culture: NO GROWTH
Culture: NO GROWTH
Special Requests: ADEQUATE

## 2023-05-16 ENCOUNTER — Ambulatory Visit: Payer: Medicare Other | Admitting: Hematology and Oncology

## 2023-05-21 ENCOUNTER — Emergency Department (HOSPITAL_BASED_OUTPATIENT_CLINIC_OR_DEPARTMENT_OTHER)
Admission: EM | Admit: 2023-05-21 | Discharge: 2023-05-21 | Disposition: A | Discharge status: Home / Self Care | Payer: Medicare Other | Attending: Emergency Medicine | Admitting: Emergency Medicine

## 2023-05-21 ENCOUNTER — Other Ambulatory Visit (HOSPITAL_BASED_OUTPATIENT_CLINIC_OR_DEPARTMENT_OTHER): Payer: Self-pay

## 2023-05-21 ENCOUNTER — Emergency Department (HOSPITAL_BASED_OUTPATIENT_CLINIC_OR_DEPARTMENT_OTHER): Payer: Medicare Other

## 2023-05-21 ENCOUNTER — Encounter (HOSPITAL_BASED_OUTPATIENT_CLINIC_OR_DEPARTMENT_OTHER): Payer: Self-pay

## 2023-05-21 DIAGNOSIS — Z1152 Encounter for screening for COVID-19: Secondary | ICD-10-CM | POA: Insufficient documentation

## 2023-05-21 DIAGNOSIS — R1010 Upper abdominal pain, unspecified: Secondary | ICD-10-CM | POA: Insufficient documentation

## 2023-05-21 DIAGNOSIS — Z79899 Other long term (current) drug therapy: Secondary | ICD-10-CM | POA: Insufficient documentation

## 2023-05-21 DIAGNOSIS — A0472 Enterocolitis due to Clostridium difficile, not specified as recurrent: Secondary | ICD-10-CM | POA: Diagnosis not present

## 2023-05-21 DIAGNOSIS — R197 Diarrhea, unspecified: Secondary | ICD-10-CM | POA: Diagnosis present

## 2023-05-21 DIAGNOSIS — D649 Anemia, unspecified: Secondary | ICD-10-CM | POA: Insufficient documentation

## 2023-05-21 DIAGNOSIS — I10 Essential (primary) hypertension: Secondary | ICD-10-CM | POA: Diagnosis not present

## 2023-05-21 DIAGNOSIS — E871 Hypo-osmolality and hyponatremia: Secondary | ICD-10-CM | POA: Insufficient documentation

## 2023-05-21 DIAGNOSIS — Z7982 Long term (current) use of aspirin: Secondary | ICD-10-CM | POA: Insufficient documentation

## 2023-05-21 DIAGNOSIS — Z853 Personal history of malignant neoplasm of breast: Secondary | ICD-10-CM | POA: Diagnosis not present

## 2023-05-21 DIAGNOSIS — R0602 Shortness of breath: Secondary | ICD-10-CM | POA: Insufficient documentation

## 2023-05-21 HISTORY — DX: Enterocolitis due to Clostridium difficile, not specified as recurrent: A04.72

## 2023-05-21 LAB — CBC WITH DIFFERENTIAL/PLATELET
Abs Immature Granulocytes: 0.01 10*3/uL (ref 0.00–0.07)
Basophils Absolute: 0 10*3/uL (ref 0.0–0.1)
Basophils Relative: 1 %
Eosinophils Absolute: 0 10*3/uL (ref 0.0–0.5)
Eosinophils Relative: 0 %
HCT: 32.4 % — ABNORMAL LOW (ref 36.0–46.0)
Hemoglobin: 10.6 g/dL — ABNORMAL LOW (ref 12.0–15.0)
Immature Granulocytes: 0 %
Lymphocytes Relative: 11 %
Lymphs Abs: 0.6 10*3/uL — ABNORMAL LOW (ref 0.7–4.0)
MCH: 28.8 pg (ref 26.0–34.0)
MCHC: 32.7 g/dL (ref 30.0–36.0)
MCV: 88 fL (ref 80.0–100.0)
Monocytes Absolute: 0.3 10*3/uL (ref 0.1–1.0)
Monocytes Relative: 6 %
Neutro Abs: 4.7 10*3/uL (ref 1.7–7.7)
Neutrophils Relative %: 82 %
Platelets: 137 10*3/uL — ABNORMAL LOW (ref 150–400)
RBC: 3.68 MIL/uL — ABNORMAL LOW (ref 3.87–5.11)
RDW: 14.8 % (ref 11.5–15.5)
WBC: 5.7 10*3/uL (ref 4.0–10.5)
nRBC: 0 % (ref 0.0–0.2)

## 2023-05-21 LAB — URINALYSIS, ROUTINE W REFLEX MICROSCOPIC
Bilirubin Urine: NEGATIVE
Glucose, UA: NEGATIVE mg/dL
Ketones, ur: NEGATIVE mg/dL
Nitrite: NEGATIVE
Protein, ur: 30 mg/dL — AB
Specific Gravity, Urine: 1.01 (ref 1.005–1.030)
pH: 5.5 (ref 5.0–8.0)

## 2023-05-21 LAB — RESP PANEL BY RT-PCR (RSV, FLU A&B, COVID)  RVPGX2
Influenza A by PCR: NEGATIVE
Influenza B by PCR: NEGATIVE
Resp Syncytial Virus by PCR: NEGATIVE
SARS Coronavirus 2 by RT PCR: NEGATIVE

## 2023-05-21 LAB — COMPREHENSIVE METABOLIC PANEL
ALT: 92 U/L — ABNORMAL HIGH (ref 0–44)
AST: 92 U/L — ABNORMAL HIGH (ref 15–41)
Albumin: 3.2 g/dL — ABNORMAL LOW (ref 3.5–5.0)
Alkaline Phosphatase: 59 U/L (ref 38–126)
Anion gap: 8 (ref 5–15)
BUN: 20 mg/dL (ref 8–23)
CO2: 20 mmol/L — ABNORMAL LOW (ref 22–32)
Calcium: 8.1 mg/dL — ABNORMAL LOW (ref 8.9–10.3)
Chloride: 103 mmol/L (ref 98–111)
Creatinine, Ser: 1.13 mg/dL — ABNORMAL HIGH (ref 0.44–1.00)
GFR, Estimated: 48 mL/min — ABNORMAL LOW (ref >60)
Glucose, Bld: 132 mg/dL — ABNORMAL HIGH (ref 70–99)
Potassium: 3.9 mmol/L (ref 3.5–5.1)
Sodium: 131 mmol/L — ABNORMAL LOW (ref 135–145)
Total Bilirubin: 0.8 mg/dL (ref 0.3–1.2)
Total Protein: 6 g/dL — ABNORMAL LOW (ref 6.5–8.1)

## 2023-05-21 LAB — URINALYSIS, MICROSCOPIC (REFLEX)

## 2023-05-21 LAB — MAGNESIUM: Magnesium: 1.8 mg/dL (ref 1.7–2.4)

## 2023-05-21 LAB — LIPASE, BLOOD: Lipase: 24 U/L (ref 11–51)

## 2023-05-21 LAB — LACTIC ACID, PLASMA: Lactic Acid, Venous: 1.3 mmol/L (ref 0.5–1.9)

## 2023-05-21 MED ORDER — VANCOMYCIN HCL 125 MG PO CAPS: 125.0000 mg | ORAL_CAPSULE | Freq: Once | ORAL | Status: DC

## 2023-05-21 MED ORDER — LACTATED RINGERS IV BOLUS
1000.0000 mL | Freq: Once | INTRAVENOUS | Status: AC
  Administered 2023-05-21: 1000 mL via INTRAVENOUS

## 2023-05-21 MED ORDER — VANCOMYCIN HCL 125 MG PO CAPS: 125.0000 mg | ORAL_CAPSULE | Freq: Four times a day (QID) | ORAL | 0 refills | Status: AC

## 2023-05-21 MED ORDER — IOHEXOL 350 MG/ML SOLN
100.0000 mL | Freq: Once | INTRAVENOUS | Status: AC | PRN
  Administered 2023-05-21: 75 mL via INTRAVENOUS

## 2023-05-21 MED ORDER — ONDANSETRON HCL 4 MG/2ML IJ SOLN
4.0000 mg | Freq: Once | INTRAMUSCULAR | Status: AC
  Administered 2023-05-21: 4 mg via INTRAVENOUS
  Filled 2023-05-21: qty 2

## 2023-05-21 MED ORDER — VANCOMYCIN HCL 125 MG PO CAPS
125.0000 mg | ORAL_CAPSULE | Freq: Four times a day (QID) | ORAL | 0 refills | Status: DC
  Filled 2023-05-21: qty 40, 10d supply, fill #0

## 2023-05-21 NOTE — ED Provider Notes (Signed)
[facility] Provider Note   CSN: [id number] Arrival date & time: 05/21/23  3235     History  Chief Complaint  Patient presents with   Diarrhea    Jasmine Cantu is a 82 y.o. female with past medical history significant for GERD, IBS, H. pylori infection, C. difficile colitis, hypertension, stenosis of IMA, small bowel ischemia, breast cancer presents to the ED with her daughter due to diarrhea.  Patient's daughter states that patient seemed mildly confused on Saturday and took her scheduled meds twice.  She reports they did call poison control and were told to just keep the patient hydrated.  Patient reports her diarrhea began again yesterday and she was in and out of the bathroom all night.  Daughter observe the stool this morning and states that it was dark green and then shifted to dark brown.  Patient seemed very fatigued and weak this morning.  She was also reported to be pale and mildly confused, but is mentating at her baseline now.  Patient was started on iron on the 18th.  She was hospitalized earlier this month for C. difficile colitis and completed her antibiotic treatment.  She states she is not currently having abdominal pain, but does occasionally get cramping or burning pain in her upper abdomen.  Denies nausea, vomiting, fever, chills, blood in stool, lightheadedness, syncope.        Home Medications Prior to Admission medications   Medication Sig Start Date End Date Taking? Authorizing Provider  aspirin 81 MG chewable tablet Chew 1 tablet (81 mg total) by mouth daily. 04/30/23 05/30/23 Yes Dahal, Melina Schools, MD  benazepril (LOTENSIN) 10 MG tablet Take 10 mg by mouth daily.   Yes [provider]  ferrous sulfate 325 (65 FE) MG EC tablet Take 325 mg by mouth daily with breakfast.   Yes [provider]  tamoxifen (NOLVADEX) 20 MG tablet Take 1 tablet (20 mg total) by mouth daily. 03/07/23  Yes Causey, Larna Daughters, NP   vancomycin (VANCOCIN) 125 MG capsule Take 1 capsule (125 mg total) by mouth 4 (four) times daily for 10 days. 05/21/23 05/31/23 Yes Lillis Nuttle R, PA-C  acetaminophen (TYLENOL) 500 MG tablet Take 500 mg by mouth every 6 (six) hours as needed for mild pain.    [provider]  calcium carbonate (TUMS - DOSED IN MG ELEMENTAL CALCIUM) 500 MG chewable tablet Chew 1 tablet by mouth daily.    [provider]  Cholecalciferol (VITAMIN D3) 50 MCG (2000 UT) capsule Take 200 Units by mouth daily.    [provider]  cholestyramine (QUESTRAN) 4 g packet Take 1 packet (4 g total) by mouth 3 (three) times daily for 7 days. 04/29/23 05/06/23  Lorin Glass, MD  cyanocobalamin (,VITAMIN B-12,) 1000 MCG/ML injection Inject 1,000 mcg into the muscle every 30 (thirty) days. 11/28/21   [provider]  traMADol (ULTRAM) 50 MG tablet Take 1-2 tablets (50-100 mg total) by mouth every 6 (six) hours as needed for moderate pain or severe pain. Patient taking differently: Take 50 mg by mouth every 6 (six) hours as needed for moderate pain or severe pain. 01/28/23   Almond Lint, MD  ipratropium (ATROVENT) 0.06 % nasal spray Place 2 sprays into both nostrils 4 (four) times daily. 08/05/19 11/19/19  Belinda Fisher, PA-C      Allergies    Doxycycline, Codeine, and Hydrocodone    Review of Systems   Review of Systems  Constitutional:  Positive for fatigue. Negative for fever.  Gastrointestinal:  Positive for abdominal pain (intermittent) and diarrhea. Negative for blood in stool, nausea and vomiting.  Neurological:  Positive for weakness. Negative for syncope and light-headedness.  Psychiatric/Behavioral:  Positive for confusion (resolved).     Physical Exam Updated Vital Signs BP (!) 115/42   Pulse (!) 57   Temp 97.6 F (36.4 C)   Resp 15   Wt 57.6 kg   SpO2 99%   BMI 22.50 kg/m  Physical Exam Vitals and nursing note reviewed.  Constitutional:      General: She is not in acute  distress.    Appearance: Normal appearance. She is ill-appearing. She is not diaphoretic.  HENT:     Mouth/Throat:     Lips: Pink.     Mouth: Mucous membranes are dry.     Pharynx: Oropharynx is clear.  Cardiovascular:     Rate and Rhythm: Normal rate and regular rhythm.  Pulmonary:     Effort: Pulmonary effort is normal.  Abdominal:     General: Abdomen is flat. Bowel sounds are normal.     Palpations: Abdomen is soft.     Tenderness: There is no abdominal tenderness.  Skin:    General: Skin is warm and dry.     Capillary Refill: Capillary refill takes less than 2 seconds.     Coloration: Skin is pale.  Neurological:     Mental Status: She is alert and oriented to person, place, and time. Mental status is at baseline.     GCS: GCS eye subscore is 4. GCS verbal subscore is 5. GCS motor subscore is 6.  Psychiatric:        Mood and Affect: Mood normal.        Behavior: Behavior normal.     ED Results / Procedures / Treatments   Labs (all labs ordered are listed, but only abnormal results are displayed) Labs Reviewed  COMPREHENSIVE METABOLIC PANEL - Abnormal; Notable for the following components:      Result Value   Sodium 131 (*)    CO2 20 (*)    Glucose, Bld 132 (*)    Creatinine, Ser 1.13 (*)    Calcium 8.1 (*)    Total Protein 6.0 (*)    Albumin 3.2 (*)    AST 92 (*)    ALT 92 (*)    GFR, Estimated 48 (*)    All other components within normal limits  CBC WITH DIFFERENTIAL/PLATELET - Abnormal; Notable for the following components:   RBC 3.68 (*)    Hemoglobin 10.6 (*)    HCT 32.4 (*)    Platelets 137 (*)    Lymphs Abs 0.6 (*)    All other components within normal limits  URINALYSIS, ROUTINE W REFLEX MICROSCOPIC - Abnormal; Notable for the following components:   Hgb urine dipstick SMALL (*)    Protein, ur 30 (*)    Leukocytes,Ua SMALL (*)    All other components within normal limits  URINALYSIS, MICROSCOPIC (REFLEX) - Abnormal; Notable for the following  components:   Bacteria, UA RARE (*)    All other components within normal limits  RESP PANEL BY RT-PCR (RSV, FLU A&B, COVID)  RVPGX2  LIPASE, BLOOD  MAGNESIUM  LACTIC ACID, PLASMA    EKG None  Radiology CT Angio Abd/Pel W and/or Wo Contrast  Result Date: 05/21/2023 CLINICAL DATA:  Diarrhea, history of colitis EXAM: CTA ABDOMEN AND PELVIS WITH CONTRAST TECHNIQUE: Multidetector CT imaging of the abdomen  and pelvis was performed using the standard protocol during bolus administration of intravenous contrast. Multiplanar reconstructed images and MIPs were obtained and reviewed to evaluate the vascular anatomy. RADIATION DOSE REDUCTION: This exam was performed according to the departmental dose-optimization program which includes automated exposure control, adjustment of the mA and/or kV according to patient size and/or use of iterative reconstruction technique. CONTRAST:  75mL OMNIPAQUE IOHEXOL 350 MG/ML SOLN COMPARISON:  04/25/2023 FINDINGS: VASCULAR Aorta: Normal caliber aorta without aneurysm, dissection, vasculitis or significant stenosis. Stable diffuse atherosclerosis. Celiac: Patent without evidence of aneurysm, dissection, vasculitis or significant stenosis. SMA: Patent without evidence of aneurysm, dissection, vasculitis or significant stenosis. Renals: Single right renal artery and 3 left renal arteries identified. Bilateral renal arteries are patent without evidence of aneurysm, dissection, vasculitis, fibromuscular dysplasia, or significant stenosis. 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 No evidence of aneurysm, dissection, or vasculitis. Inflow: Patent without evidence of aneurysm, dissection, vasculitis or significant stenosis. Proximal Outflow: Bilateral common femoral and visualized portions of the superficial and profunda femoral arteries are patent without evidence of aneurysm, dissection, vasculitis or significant stenosis. Veins: No obvious venous abnormality  within the limitations of this arterial phase study. Review of the MIP images confirms the above findings. NON-VASCULAR Lower chest: No acute pleural or parenchymal lung disease. Large hiatal hernia. Hepatobiliary: No focal liver abnormality is seen. Status post cholecystectomy. No biliary dilatation. Pancreas: Unremarkable. No pancreatic ductal dilatation or surrounding inflammatory changes. Spleen: Normal in size without focal abnormality. Adrenals/Urinary Tract: The adrenals are stable. Scattered right renal cortical scarring again noted. Otherwise the kidneys enhance normally. No urinary tract calculi or obstruction. The bladder is decompressed, limiting its evaluation. Stomach/Bowel: There is persistent wall thickening involving the cecum, ascending colon, and proximal transverse colon. The mural thickening involving the distal transverse colon, descending, and sigmoid colon on prior study has improved in the interim. No bowel obstruction or ileus. Distal colonic diverticulosis again noted. Large hiatal hernia. Evidence of prior small bowel resection and reanastomosis. Lymphatic: No pathologic adenopathy within the abdomen or pelvis. Reproductive: Status post hysterectomy. No adnexal masses. Other: No free fluid or free intraperitoneal gas. No abdominal wall hernia. Musculoskeletal: No acute or destructive bony abnormalities. Reconstructed images demonstrate no additional findings. IMPRESSION: VASCULAR 1.  Aortic Atherosclerosis (ICD10-I70.0). 2. 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 NON-VASCULAR 1. Persistent colonic wall thickening from the cecum through the mid transverse colon. The distal colonic wall thickening seen previously has resolved in the interim. Findings are consistent with residual infectious colitis given recent history of C difficile colitis. 2. Large hiatal hernia. 3. Distal colonic diverticulosis. Electronically Signed   By: Sharlet Salina M.D.   On: 05/21/2023 16:26     Procedures Procedures    Medications Ordered in ED Medications  lactated ringers bolus 1,000 mL (0 mLs Intravenous Stopped 05/21/23 1218)  ondansetron (ZOFRAN) injection 4 mg (4 mg Intravenous Given 05/21/23 1135)  iohexol (OMNIPAQUE) 350 MG/ML injection 100 mL (75 mLs Intravenous Contrast Given 05/21/23 1243)    ED Course/ Medical Decision Making/ A&P                                 Medical Decision Making Amount and/or Complexity of Data Reviewed Labs: ordered. Radiology: ordered.  Risk Prescription drug management.   This patient presents to the ED with chief complaint(s) of diarrhea, weakness, fatigue with pertinent past medical history of C. Difficile  colitis, stenosis of IMA.  The complaint involves an extensive differential diagnosis and also carries with it a high risk of complications and morbidity.    The differential diagnosis includes C. Difficile colitis, electrolyte disturbance, dehydration   The initial plan is to obtain labs  Additional history obtained: Additional history obtained from family Records reviewed previous admission documents  Initial Assessment:   Exam significant for ill-appearing and pale patient who does not appear to be in acute distress.  Mucous membranes are mildly dry.  Abdomen is soft with no tenderness to palpation.  No guarding or overlying skin changes.  Skin is dry.  She is alert and oriented.  Patient is answering all questions appropriately.  Patient provides her own HPI.  Independent ECG/labs interpretation:  The following labs were independently interpreted:  Negative for COVID, influenza, RSV.  Lactic acid normal.  CBC without leukocytosis, there is mild anemia, however appears improved from previous.  Metabolic panel with mild hyponatremia, no other major electrolyte disturbance.  Renal function is slightly worse than baseline, but not AKI.  Lipase unremarkable.  Magnesium normal.  UA without evidence of  infection.  Independent visualization and interpretation of imaging: I independently visualized the following imaging with scope of interpretation limited to determining acute life threatening conditions related to emergency care: CT angio abdomen/pelvis, which revealed stable high grade stenosis at origin of IMA, persistent colonic wall thickening from cecum through the mid transverse colon.  Distal colonic wall thickening seen previously has resolved.  Findings are consistent with residual infectious colitis.  She has a large hiatal hernia and distal colonic diverticulosis.  Treatment and Reassessment: Patient given IV fluid bolus with improvement in symptoms.  She did have some burning upper abdominal pain and nausea which was relieved with Zofran.    Disposition:   Patient has workup consistent with residual C. difficile colitis.  Will treat her with another 10 days of vancomycin.  Advised patient and daughter to follow-up with primary care provider at the end of this week or early next week.  They do have another PCP appointment scheduled on November 18.    The patient has been appropriately medically screened and/or stabilized in the ED. I have low suspicion for any other emergent medical condition which would require further screening, evaluation or treatment in the ED or require inpatient management. At time of discharge the patient is hemodynamically stable and in no acute distress. I have discussed work-up results and diagnosis with patient and answered all questions. Patient is agreeable with discharge plan. We discussed strict return precautions for returning to the emergency department and they verbalized understanding.           Final Clinical Impression(s) / ED Diagnoses Final diagnoses:  Colitis due to Clostridioides difficile    Rx / DC Orders ED Discharge Orders          Ordered    vancomycin (VANCOCIN) 125 MG capsule  4 times daily        05/21/23 [address] [place] R, PA-C 05/21/23 1717    Ernie Avena, MD 05/22/23 1353

## 2023-05-21 NOTE — ED Notes (Signed)
Pt unable to urinate at this time.  

## 2023-05-21 NOTE — ED Triage Notes (Addendum)
Pt brought in by daughter. Daughter reports confusion on Saturday and took scheduled meds x 2. Called poison control and just kept hydrated. Started with diarrhea yesterday and in and out of BR all night . Daughter observed stool this morning states dark green then dark brown Increase confusion, weak and pale. SOB Started on iron on the 18th. Recently hospitalized with CDiff Completed meds

## 2023-05-21 NOTE — Discharge Instructions (Addendum)
Thank you for allowing [place] to be a part of your care today.  Your evaluated in the ED for ongoing diarrhea.  Your workup is consistent with a persistent C. difficile colitis.  We prescribed another 10 days of vancomycin to take 4 times per day.  I recommend taking this at breakfast, lunch, dinner, and bedtime.  Be sure to increase your fluid intake as best as possible to stay well-hydrated as diarrhea is very dehydrating.  Your kidney function was mildly reduced today, likely due to dehydration.  Your electrolytes were okay.  Schedule a follow-up appointment with your primary care doctor at the end of this week or early next week to ensure that your symptoms are improving.  Return to the ED if you develop sudden worsening of your symptoms or if you have new concerns.

## 2023-05-21 NOTE — ED Notes (Signed)
Pt advised her RLQ and RUQ abd are hurting her. Describes it as burning. Just started about 10 minutes prior.

## 2023-05-22 ENCOUNTER — Other Ambulatory Visit: Payer: Self-pay

## 2023-05-22 ENCOUNTER — Other Ambulatory Visit (HOSPITAL_BASED_OUTPATIENT_CLINIC_OR_DEPARTMENT_OTHER): Payer: Self-pay

## 2023-05-29 ENCOUNTER — Telehealth: Payer: Self-pay

## 2023-05-29 ENCOUNTER — Other Ambulatory Visit: Payer: Self-pay

## 2023-05-29 NOTE — Telephone Encounter (Signed)
Auth Submission: NO AUTH NEEDED Site of care: Site of care: [facility] Payer: Medicare A/B with A[employer] supplement Medication & CPT/J Code(s) submitted: Bezlotoxumab Route of submission (phone, fax, portal):  Phone # Fax # Auth type: Buy/Bill PB Units/visits requested: 580mg  x 1 dose Reference number:  Approval from: 05/29/23 to 09/19/22    Medicare A/B will cover 80%, AARP supplement will cover the remaining 20%.

## 2023-05-30 ENCOUNTER — Other Ambulatory Visit (HOSPITAL_COMMUNITY): Payer: Self-pay | Admitting: Pharmacy Technician

## 2023-05-30 DIAGNOSIS — A0471 Enterocolitis due to Clostridium difficile, recurrent: Secondary | ICD-10-CM

## 2023-06-05 ENCOUNTER — Ambulatory Visit (HOSPITAL_COMMUNITY)
Admission: RE | Admit: 2023-06-05 | Discharge: 2023-06-05 | Disposition: A | Discharge status: Home / Self Care | Payer: Medicare Other | Source: Ambulatory Visit | Attending: Family Medicine | Admitting: Family Medicine

## 2023-06-05 DIAGNOSIS — A0471 Enterocolitis due to Clostridium difficile, recurrent: Secondary | ICD-10-CM | POA: Diagnosis present

## 2023-06-05 MED ORDER — SODIUM CHLORIDE 0.9 % IV SOLN
10.0000 mg/kg | Freq: Once | INTRAVENOUS | Status: AC
  Administered 2023-06-05: 576 mg via INTRAVENOUS
  Filled 2023-06-05: qty 23.04

## 2023-06-07 ENCOUNTER — Encounter: Payer: Medicare Other | Admitting: Adult Health

## 2023-06-07 ENCOUNTER — Encounter: Payer: Self-pay | Admitting: Adult Health

## 2023-06-07 ENCOUNTER — Inpatient Hospital Stay: Payer: Medicare Other | Attending: Hematology and Oncology | Admitting: Adult Health

## 2023-06-07 VITALS — BP 143/53 | HR 70 | Temp 98.1°F | Wt 125.5 lb

## 2023-06-07 DIAGNOSIS — Z800 Family history of malignant neoplasm of digestive organs: Secondary | ICD-10-CM | POA: Diagnosis not present

## 2023-06-07 DIAGNOSIS — M8580 Other specified disorders of bone density and structure, unspecified site: Secondary | ICD-10-CM | POA: Diagnosis not present

## 2023-06-07 DIAGNOSIS — Z809 Family history of malignant neoplasm, unspecified: Secondary | ICD-10-CM | POA: Insufficient documentation

## 2023-06-07 DIAGNOSIS — C50411 Malignant neoplasm of upper-outer quadrant of right female breast: Secondary | ICD-10-CM | POA: Diagnosis not present

## 2023-06-07 DIAGNOSIS — Z79899 Other long term (current) drug therapy: Secondary | ICD-10-CM | POA: Diagnosis not present

## 2023-06-07 DIAGNOSIS — Z170 Estrogen receptor positive status [ER+]: Secondary | ICD-10-CM | POA: Insufficient documentation

## 2023-06-07 NOTE — Progress Notes (Signed)
SURVIVORSHIP VISIT:  BRIEF ONCOLOGIC HISTORY:  Oncology History  Malignant neoplasm of upper-outer quadrant of right breast in female, estrogen receptor positive (HCC)  12/20/2022 Mammogram   Possible mass in the right breast noted on screening mammogram.Highly suspicious irregular mass in the right breast at 11 o'clock. No axillary adenopathy. Surrounding fibrocystic changes. Recommend ultrasound-guided biopsy of the 11 o'clock right breast mass.   01/10/2023 Pathology Results   Right breast needle core biopsy at 11 0 clock 2 cm from nipple show grade 1 IDC, focal DCIS, cribriform type. ER pos 100% strong staining, PR positive 100% strong staining intensity Ki 67 5%   01/28/2023 Surgery   Right breast lumpectomy: IDC, 1.3 cm, grade 1, margins negative, 0 SLN biopsied   03/07/2023 -  Anti-estrogen oral therapy   20 mg Tamoxifen daily     INTERVAL HISTORY:  Jasmine Cantu to review her survivorship care plan detailing her treatment course for breast cancer, as well as monitoring long-term side effects of that treatment, education regarding health maintenance, screening, and overall wellness and health promotion.     Overall, Jasmine Cantu reports feeling quite well.  The patient, diagnosed with estrogen positive stage 1A breast cancer, has been on tamoxifen since August. She reports no hot flashes or vaginal wetness, common side effects of tamoxifen. She has not experienced any other issues such as burning sensations, pain, or nausea. However, she has been on antibiotics for Cdiff, which required a hospital stay of about five days.  All of her symptoms related to C. difficile have resolved and did so approximately 2 weeks ago.  REVIEW OF SYSTEMS:  Review of Systems  Constitutional:  Negative for appetite change, chills, fatigue, fever and unexpected weight change.  HENT:   Negative for hearing loss, lump/mass and trouble swallowing.   Eyes:  Negative for eye problems and icterus.  Respiratory:   Negative for chest tightness, cough and shortness of breath.   Cardiovascular:  Negative for chest pain, leg swelling and palpitations.  Gastrointestinal:  Negative for abdominal distention, abdominal pain, constipation, diarrhea, nausea and vomiting.  Endocrine: Negative for hot flashes.  Genitourinary:  Negative for difficulty urinating.   Musculoskeletal:  Negative for arthralgias.  Skin:  Negative for itching and rash.  Neurological:  Negative for dizziness, extremity weakness, headaches and numbness.  Hematological:  Negative for adenopathy. Does not bruise/bleed easily.  Psychiatric/Behavioral:  Negative for depression. The patient is not nervous/anxious.    Breast: Denies any new nodularity, masses, tenderness, nipple changes, or nipple discharge.       PAST MEDICAL/SURGICAL HISTORY:  Past Medical History:  Diagnosis Date   Allergy    Anemia    C. difficile diarrhea    Cataract    Collagenous colitis 2004   Diverticulosis    Esophageal stricture 1988   GERD (gastroesophageal reflux disease)    H. pylori infection    Hiatal hernia    Hypertension    IBS (irritable bowel syndrome)    Iron deficiency anemia    Tubular adenoma of colon    Ulcer    Vitamin B12 deficiency    Past Surgical History:  Procedure Laterality Date   APPENDECTOMY     BLADDER SUSPENSION     BREAST BIOPSY Right 01/10/2023   [place] RT BREAST BX W LOC DEV 1ST LESION IMG BX SPEC US GUIDE 01/10/2023 [facility]   BREAST BIOPSY  01/25/2023   [place] RT RADIOACTIVE SEED LOC 01/25/2023 [facility]   BREAST LUMPECTOMY WITH RADIOACTIVE SEED LOCALIZATION  Right 01/28/2023   Procedure: RIGHT BREAST LUMPECTOMY WITH RADIOACTIVE SEED LOCALIZATION;  Surgeon: Almond Lint, MD;  Location: [facility];  Service: General;  Laterality: Right;   CHOLECYSTECTOMY     EYE SURGERY     LAPAROTOMY N/A 01/08/2022   Procedure: EXPLORATORY LAPAROTOMY, SMALL BOWEL RESECTION;  Surgeon: Emelia Loron, MD;   Location: [facility];  Service: General;  Laterality: N/A;   VAGINAL HYSTERECTOMY       ALLERGIES:  Allergies  Allergen Reactions   Doxycycline Nausea And Vomiting   Codeine Other (See Comments)    GI upset   Hydrocodone Nausea Only     CURRENT MEDICATIONS:  Outpatient Encounter Medications as of 06/07/2023  Medication Sig Note   acetaminophen (TYLENOL) 500 MG tablet Take 500 mg by mouth every 6 (six) hours as needed for mild pain.    cyanocobalamin (,VITAMIN B-12,) 1000 MCG/ML injection Inject 1,000 mcg into the muscle every 30 (thirty) days. 04/25/2023: Patient states she has taken couple weeks ago from today (04-25-23) unsure of exact date per patient    ferrous sulfate 325 (65 FE) MG EC tablet Take 325 mg by mouth daily with breakfast.    hydrocortisone (ANUSOL-HC) 2.5 % rectal cream Apply 1 Application topically 2 (two) times daily.    tamoxifen (NOLVADEX) 20 MG tablet Take 1 tablet (20 mg total) by mouth daily.    vancomycin (VANCOCIN) 125 MG capsule Take by mouth.    benazepril (LOTENSIN) 10 MG tablet Take 10 mg by mouth daily. (Patient not taking: Reported on 06/07/2023)    calcium carbonate (TUMS - DOSED IN MG ELEMENTAL CALCIUM) 500 MG chewable tablet Chew 1 tablet by mouth daily. (Patient not taking: Reported on 06/07/2023)    Cholecalciferol (VITAMIN D3) 50 MCG (2000 UT) capsule Take 200 Units by mouth daily. (Patient not taking: Reported on 06/07/2023)    cholestyramine (QUESTRAN) 4 g packet Take 1 packet (4 g total) by mouth 3 (three) times daily for 7 days.    traMADol (ULTRAM) 50 MG tablet Take 1-2 tablets (50-100 mg total) by mouth every 6 (six) hours as needed for moderate pain or severe pain. (Patient not taking: Reported on 06/07/2023)    [DISCONTINUED] ipratropium (ATROVENT) 0.06 % nasal spray Place 2 sprays into both nostrils 4 (four) times daily.    No facility-administered encounter medications on file as of 06/07/2023.     ONCOLOGIC FAMILY HISTORY:  Family  History  Problem Relation Age of Onset   Diabetes Mother    Colon cancer Mother    Stroke Father    Cancer Sister    Cancer Brother    Heart attack Brother    Cancer Brother    Heart attack Brother      SOCIAL HISTORY:  Social History   Socioeconomic History   Marital status: Single    Spouse name: Not on file   Number of children: Not on file   Years of education: Not on file   Highest education level: Not on file  Occupational History   Not on file  Tobacco Use   Smoking status: Never   Smokeless tobacco: Never  Vaping Use   Vaping status: Never Used  Substance and Sexual Activity   Alcohol use: No   Drug use: No   Sexual activity: Yes    Birth control/protection: Surgical  Other Topics Concern   Not on file  Social History Narrative   Not on file   Social Determinants of Corporate investment banker  Strain: Not on file  Food Insecurity: No Food Insecurity (04/25/2023)   Hunger Vital Sign    Worried About Running Out of Food in the Last Year: Never true    Ran Out of Food in the Last Year: Never true  Transportation Needs: No Transportation Needs (04/25/2023)   PRAPARE - Administrator, Civil Service (Medical): No    Lack of Transportation (Non-Medical): No  Physical Activity: Not on file  Stress: Not on file  Social Connections: Not on file  Intimate Partner Violence: Not At Risk (04/25/2023)   Humiliation, Afraid, Rape, and Kick questionnaire    Fear of Current or Ex-Partner: No    Emotionally Abused: No    Physically Abused: No    Sexually Abused: No     OBSERVATIONS/OBJECTIVE:  BP (!) 143/53 (BP Location: Left Arm, Patient Position: Sitting) Comment: provider notified  Pulse 70   Temp 98.1 F (36.7 C) (Temporal)   Wt 125 lb 8 oz (56.9 kg)   SpO2 100%   BMI 22.23 kg/m  GENERAL: Patient is a well appearing female in no acute distress HEENT:  Sclerae anicteric.  Oropharynx clear and moist. No ulcerations or evidence of oropharyngeal  candidiasis. Neck is supple.  NODES:  No cervical, supraclavicular, or axillary lymphadenopathy palpated.  BREAST EXAM: Right breast status postlumpectomy, there is scar tissue present adjacent to the lumpectomy site, otherwise benign right axilla is benign, left breast is benign. LUNGS:  Clear to auscultation bilaterally.  No wheezes or rhonchi. HEART:  Regular rate and rhythm. No murmur appreciated. ABDOMEN:  Soft, nontender.  Positive, normoactive bowel sounds. No organomegaly palpated. MSK:  No focal spinal tenderness to palpation. Full range of motion bilaterally in the upper extremities. EXTREMITIES:  No peripheral edema.   SKIN:  Clear with no obvious rashes or skin changes. No nail dyscrasia. NEURO:  Nonfocal. Well oriented.  Appropriate affect.   LABORATORY DATA:  None for this visit.  DIAGNOSTIC IMAGING:  None for this visit.      ASSESSMENT AND PLAN:  Ms.. Cantu is a pleasant 82 y.o. female with Stage IA right breast invasive ductal carcinoma, ER+/PR+/HER2-, diagnosed in 01/2023, treated with lumpectomy and anti-estrogen therapy with Tamoxifen beginning in 02/2023.  She presents to the Survivorship [facility] for our initial meeting and routine follow-up post-completion of treatment for breast cancer.    1. Stage IA right breast cancer:  Ms. Strope is continuing to recover from definitive treatment for breast cancer. She will follow-up with her medical oncologist, Dr.  Al Pimple in 6 months with history and physical exam per surveillance protocol.  She will continue her anti-estrogen therapy with Tamoxifen. Thus far, she is tolerating the Tamoxifen well, with minimal side effects. Her mammogram is due 11/2023; orders placed today.   Today, a comprehensive survivorship care plan and treatment summary was reviewed with the patient today detailing her breast cancer diagnosis, treatment course, potential late/long-term effects of treatment, appropriate follow-up care with recommendations for the  future, and patient education resources.  A copy of this summary, along with a letter will be sent to the patient's primary care provider via mail/fax/In Basket message after today's visit.    2. Bone health: Her most recent bone density was consistent with osteopenia.  I reviewed with her that tamoxifen has a protective effect on the bones.  He is due for repeat bone density testing in May 2026.  She was given education on specific activities to promote bone health.  3. Cancer screening:  Due to Ms. Rigel's history and her age, she should receive screening for skin cancers.  The information and recommendations are listed on the patient's comprehensive care plan/treatment summary and were reviewed in detail with the patient.    4. Health maintenance and wellness promotion: Ms. Duchemin was encouraged to consume 5-7 servings of fruits and vegetables per day. We reviewed the "Nutrition Rainbow" handout.  She was also encouraged to engage in moderate to vigorous exercise for 30 minutes per day most days of the week.  She was instructed to limit her alcohol consumption and continue to abstain from tobacco use.     5. Support services/counseling: It is not uncommon for this period of the patient's cancer care trajectory to be one of many emotions and stressors.   She was given information regarding our available services and encouraged to contact me with any questions or for help enrolling in any of our support group/programs.    Follow up instructions:    -Return to cancer center in 6 months for follow-up with Dr. Al Pimple -Mammogram due in 11/2023 -DEXA in 11/2024 -She is welcome to return back to the Survivorship Clinic at any time; no additional follow-up needed at this time.  -Consider referral back to survivorship as a long-term survivor for continued surveillance  The patient was provided an opportunity to ask questions and all were answered. The patient agreed with the plan and demonstrated an  understanding of the instructions.   Total encounter time:30 minutes*in face-to-face visit time, chart review, lab review, care coordination, order entry, and documentation of the encounter time.    Lillard Anes, NP 06/07/23 9:08 AM Medical Oncology and Hematology [facility] [address] [place], [place] [address] Tel. [phone]    Fax. [phone]  *Total Encounter Time as defined by the Centers for Medicare and Medicaid Services includes, in addition to the face-to-face time of a patient visit (documented in the note above) non-face-to-face time: obtaining and reviewing outside history, ordering and reviewing medications, tests or procedures, care coordination (communications with other health care professionals or caregivers) and documentation in the medical record.

## 2023-06-27 IMAGING — CT CT CTA ABD/PEL W/CM AND/OR W/O CM
2 of 9 series · 9 of 46 positions shown, 15 images · IV contrast (Omnipaque)
Comparison: No comparison

CLINICAL DATA: 81-year-old female with GI bleeding

EXAM:
CTA ABDOMEN AND PELVIS WITHOUT AND WITH CONTRAST
TECHNIQUE: Multidetector CT imaging of the abdomen and pelvis was performed
using the standard protocol during bolus administration of
intravenous contrast. Multiplanar reconstructed images and MIPs were
obtained and reviewed to evaluate the vascular anatomy.

[Series 7: coronals · coronal · 0.76mm/px · 2 of 92 slices shown, 3 images]
[im 31/92  soft-tissue]
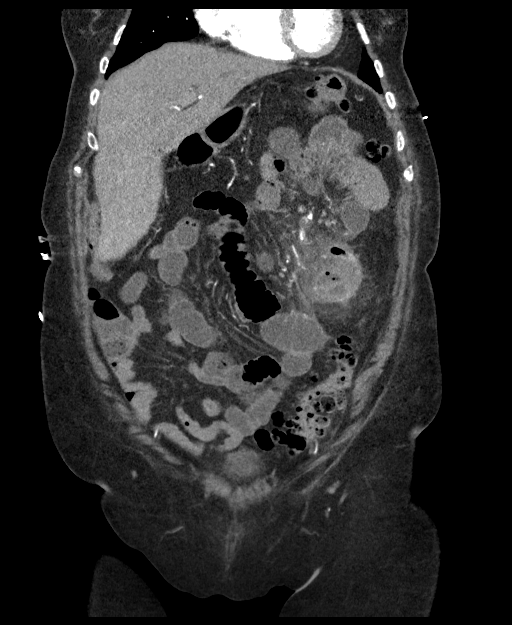
[im 31/92  bone]
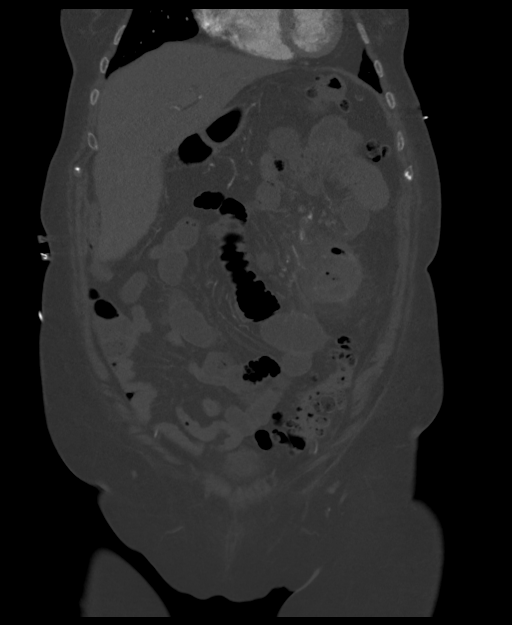
[im 61/92  soft-tissue]
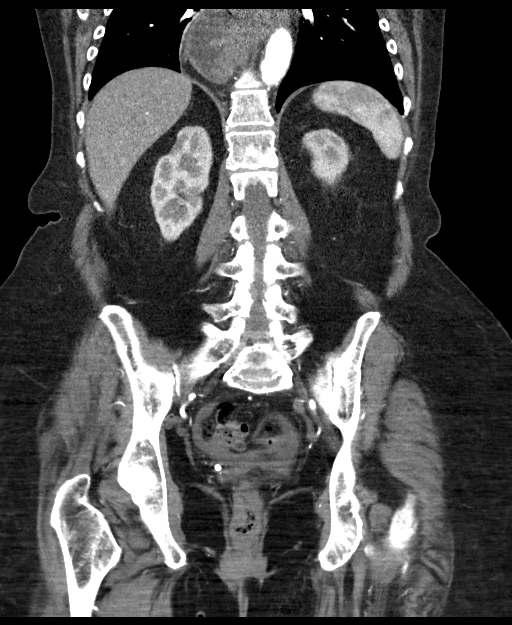

[Series 11: axial venous · axial · portal-venous · 0.98mm/px · z∈[-303,+42]mm · 7 of 93 slices shown, 12 images]
[im 12/93  soft-tissue]
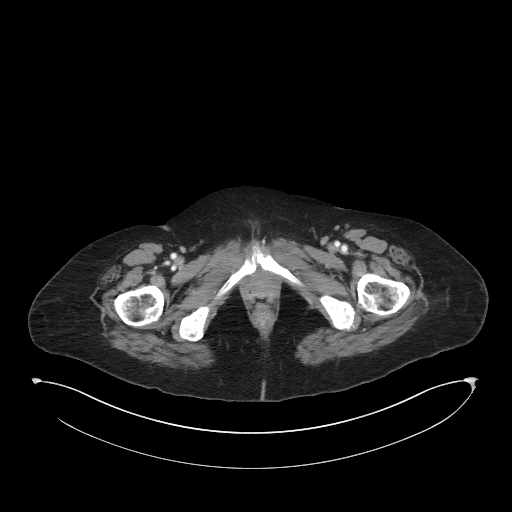
[im 12/93  bone]
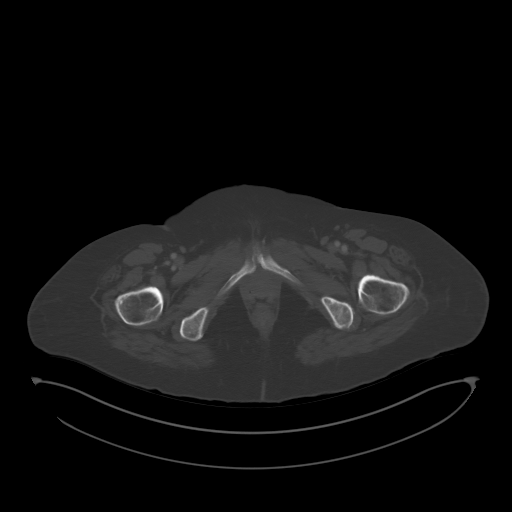
[im 24/93  soft-tissue]
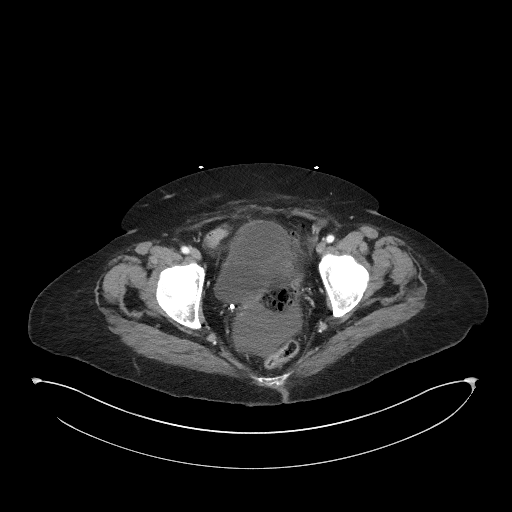
[im 35/93  soft-tissue]
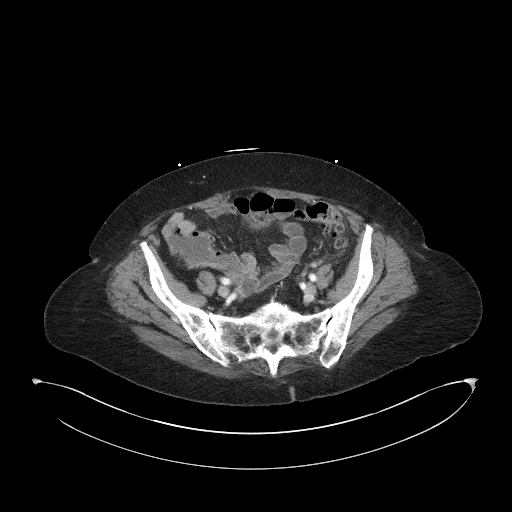
[im 47/93  soft-tissue]
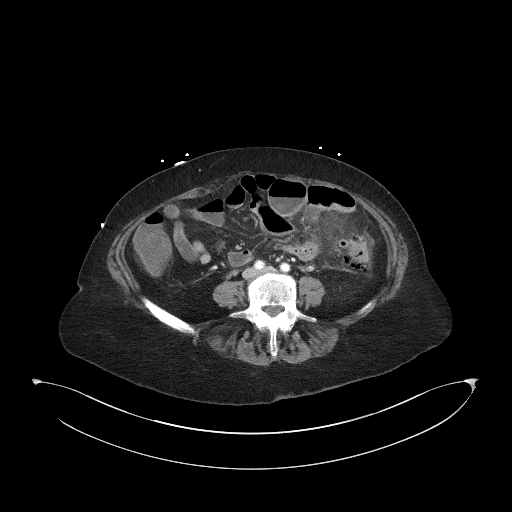
[im 47/93  lung]
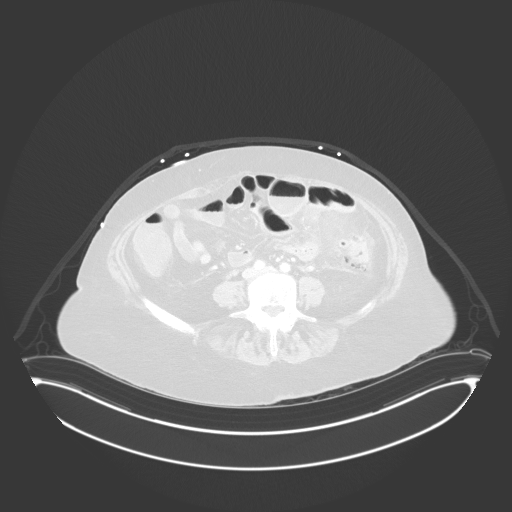
[im 58/93  soft-tissue]
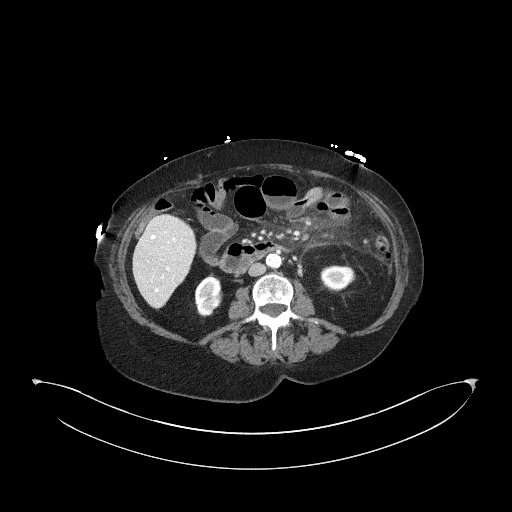
[im 58/93  lung]
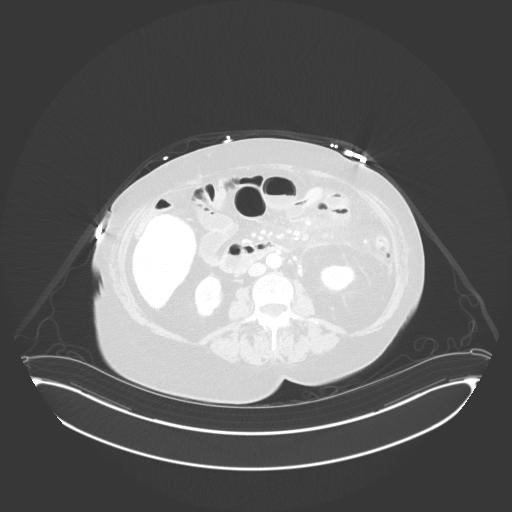
[im 70/93  soft-tissue]
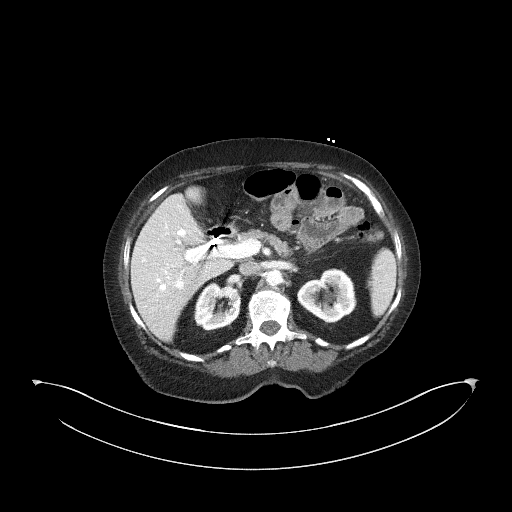
[im 70/93  lung]
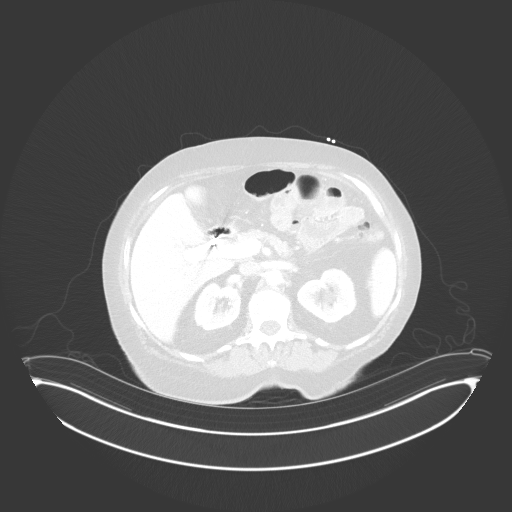
[im 81/93  soft-tissue]
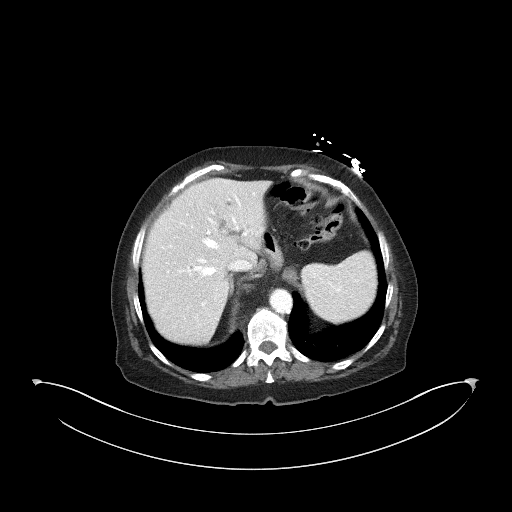
[im 81/93  lung]
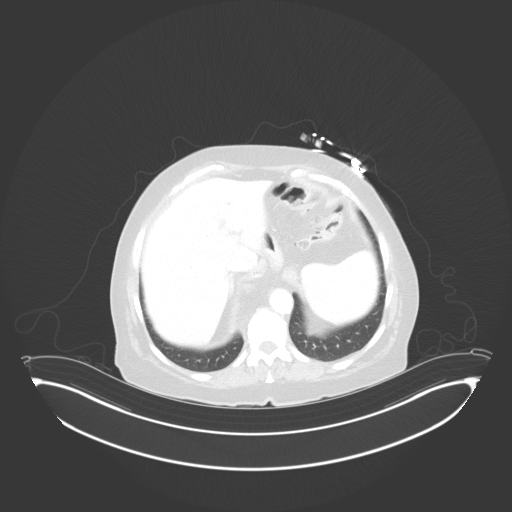

[9 of 46 positions shown; findings below may reference images not displayed]

RADIATION DOSE REDUCTION: This exam was performed according to the
departmental dose-optimization program which includes automated
exposure control, adjustment of the mA and/or kV according to
patient size and/or use of iterative reconstruction technique.

CONTRAST:  100mL OMNIPAQUE IOHEXOL 350 MG/ML SOLN
FINDINGS: VASCULAR

Aorta: Unremarkable course, caliber, contour of the abdominal aorta.
No dissection, aneurysm, or periaortic fluid. Mild atherosclerotic
changes of the lower thoracic and the abdominal aorta.

Celiac: Mild atherosclerotic changes without high-grade stenosis or
occlusion. Branches are patent.

SMA: Mild atherosclerotic changes without high-grade stenosis or
occlusion. Branches are patent.

Renals:

- Right: Single right renal artery. Atherosclerotic changes at the
origin without high-grade stenosis.

- Left: 3 left renal arteries of small caliber without significant
atherosclerotic changes at the origin.

IMA: Inferior mesenteric artery is patent.

Right lower extremity:

Unremarkable course, caliber, and contour of the right iliac system.
No aneurysm, dissection, or occlusion. Hypogastric artery is patent.
Common femoral artery patent. Proximal SFA and profunda femoris
patent.

Left lower extremity:

Unremarkable course, caliber, and contour of the left iliac system.
No aneurysm, dissection, or occlusion. Hypogastric artery is patent.
Common femoral artery patent. Proximal SFA and profunda femoris
patent.

Veins: Unremarkable appearance of the venous system.

Review of the MIP images confirms the above findings.

NON-VASCULAR

Lower chest: No acute finding of the lower chest.

Hepatobiliary: Unremarkable appearance of the liver. Cholecystectomy

Pancreas: Unremarkable.

Spleen: Unremarkable.

Adrenals/Urinary Tract:

- Right adrenal gland: Unremarkable

- Left adrenal gland: Small nodule on the medial limb of the left
adrenal gland, less than 10 mm, indeterminate on the current CT
protocol. Most likely benign, with no further imaging warranted.

- Right kidney: No hydronephrosis, nephrolithiasis, inflammation, or
ureteral dilation. No focal lesion.

- Left Kidney: No hydronephrosis, nephrolithiasis, inflammation, or
ureteral dilation. No focal lesion.

- Urinary Bladder: Unremarkable.

Stomach/Bowel:

- Stomach: Hiatal hernia with essentially intrathoracic stomach. No
comparison. Small rounded nodule within the adjacent fat measuring
13 mm. This nodule has enhancement pattern that follows the spleen
parenchyma.

- Small bowel: Dilated proximal small bowel loops. Abnormal small
bowel within the left abdomen with circumferential wall thickening
within a short segment loop without enhancement/hypoenhancement.
Significant edema within the adjacent fat. There are a few small
foci of extraluminal gas in air within the mesenteric loops
indicating perforation. Small lymph nodes within the mesentery.

- Appendix: Appendix is not visualized, however, no inflammatory
changes are present adjacent to the cecum to indicate an
appendicitis.

- Colon: Decompressed, with fluid-filled right-sided colon. Advanced
diverticular disease of the left colon and sigmoid colon.

Lymphatic: Small lymph nodes within the small bowel mesentery.

Mesenteric: Reactive free fluid within the pelvis.

Reproductive: Hysterectomy

Other: No hernia.

Musculoskeletal: No displaced fracture.
IMPRESSION: Short segment of ischemic small bowel/jejunum in the left abdomen
with perforation and small multifocal fluid and gas collections in
the mesentery, potentially secondary to a closed loop. Emergency
surgical consultation is indicated.

Developing small bowel obstruction secondary to the site of ischemic
small bowel.

These above preliminary results were discussed by telephone at the
time of interpretation on 01/08/2022 at [DATE] with Dr. TIGER
JEANS.

Reactive free fluid layering within the anatomic pelvis.

Atherosclerosis of the abdominal aorta without significant
mesenteric arterial disease or renal artery disease. Aortic
Atherosclerosis ([id number]).

Rounded nodule within the fat adjacent to hiatal hernia. This is
favored to represent a splenule rather than a lymph node. If
confirmation is warranted, consider MRI spleen protocol with
inclusion of this site on the forthcoming study.

Additional ancillary findings as above.

## 2023-06-28 IMAGING — DX DG ABD PORTABLE 1V
2 series · 2 of 2 positions shown · non-contrast
Comparison: Correlation is made with a CT of the abdomen pelvis
from yesterday

CLINICAL DATA: [DATE]-year-old female with status post
laparotomy and small-bowel resection on 01/08/2022. Patient with
nausea and vomiting.

EXAM:
PORTABLE ABDOMEN - 1 VIEW, 2 images

[abdomen kub (1 of 2)]
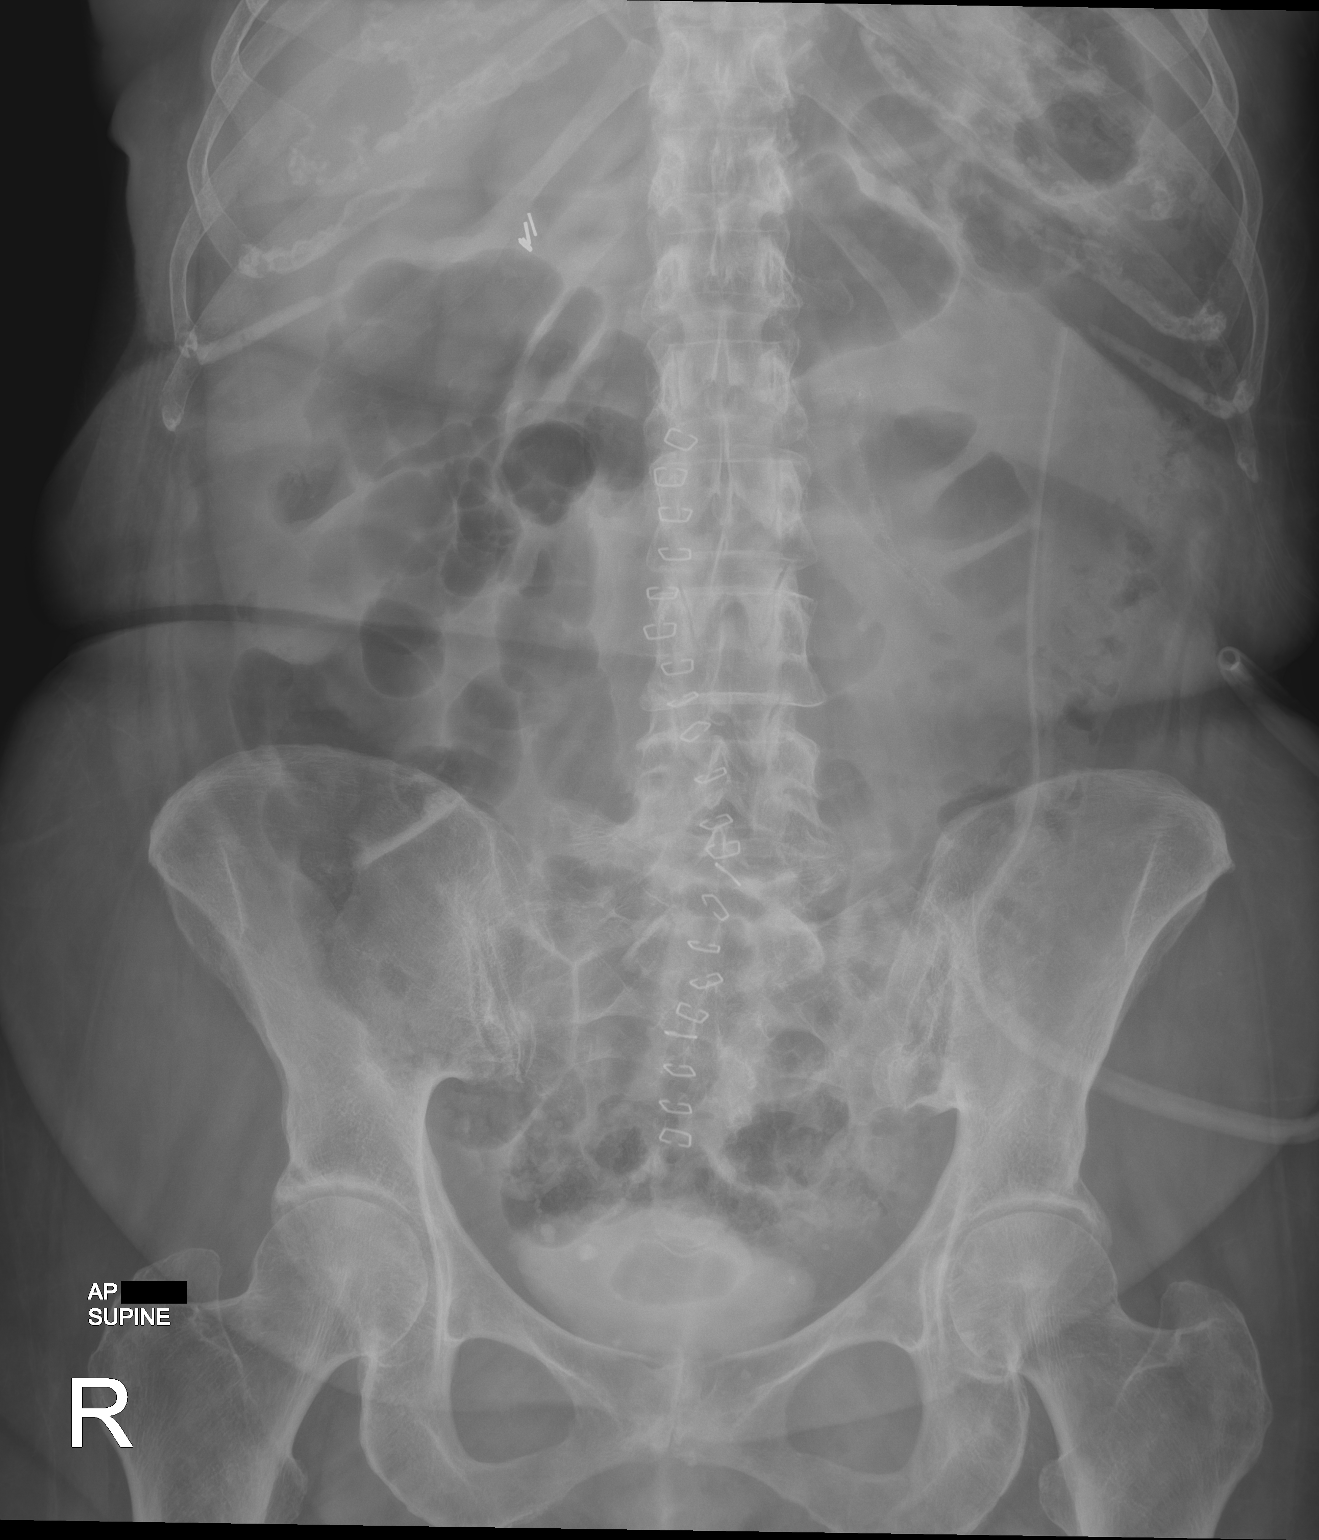

[abdomen kub (2 of 2)]
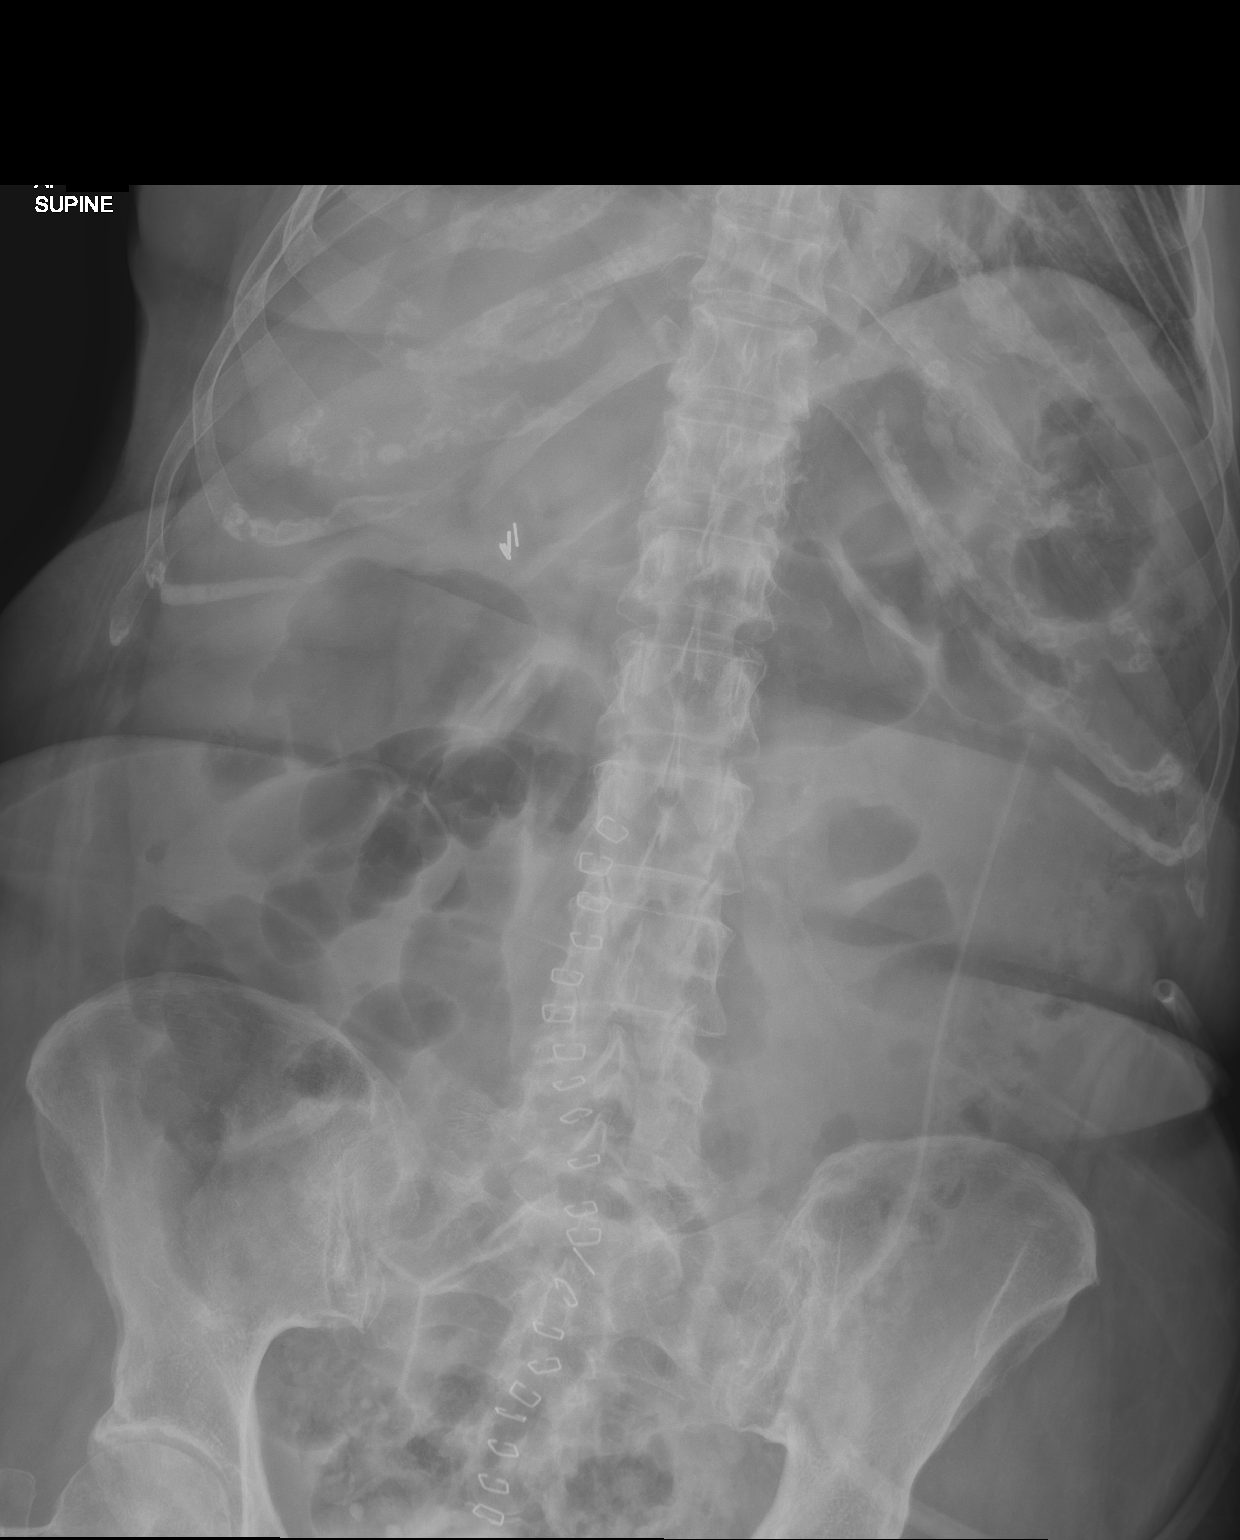

[2 of 2 positions shown; findings below may reference images not displayed]

FINDINGS: The bowel gas pattern is normal. No radio-opaque calculi or other
significant radiographic abnormality are seen.

There are post laparotomy changes with skin staples seen in the
right paramedian aspect. Drainage catheter at the left side of the
abdomen. Mild gaseous distention of the small and large bowel loops
and is nonobstructive. No radiopaque calculus seen.
IMPRESSION: There are post laparotomy changes seen. Bowel-gas pattern is
nonspecific and nonobstructive.

## 2023-06-29 IMAGING — DX DG ABDOMEN 1V
1 series · 1 of 1 positions shown · non-contrast
Comparison: 01/09/2022

CLINICAL DATA: NG tube placement

EXAM:
ABDOMEN - 1 VIEW

[abdomen kub]
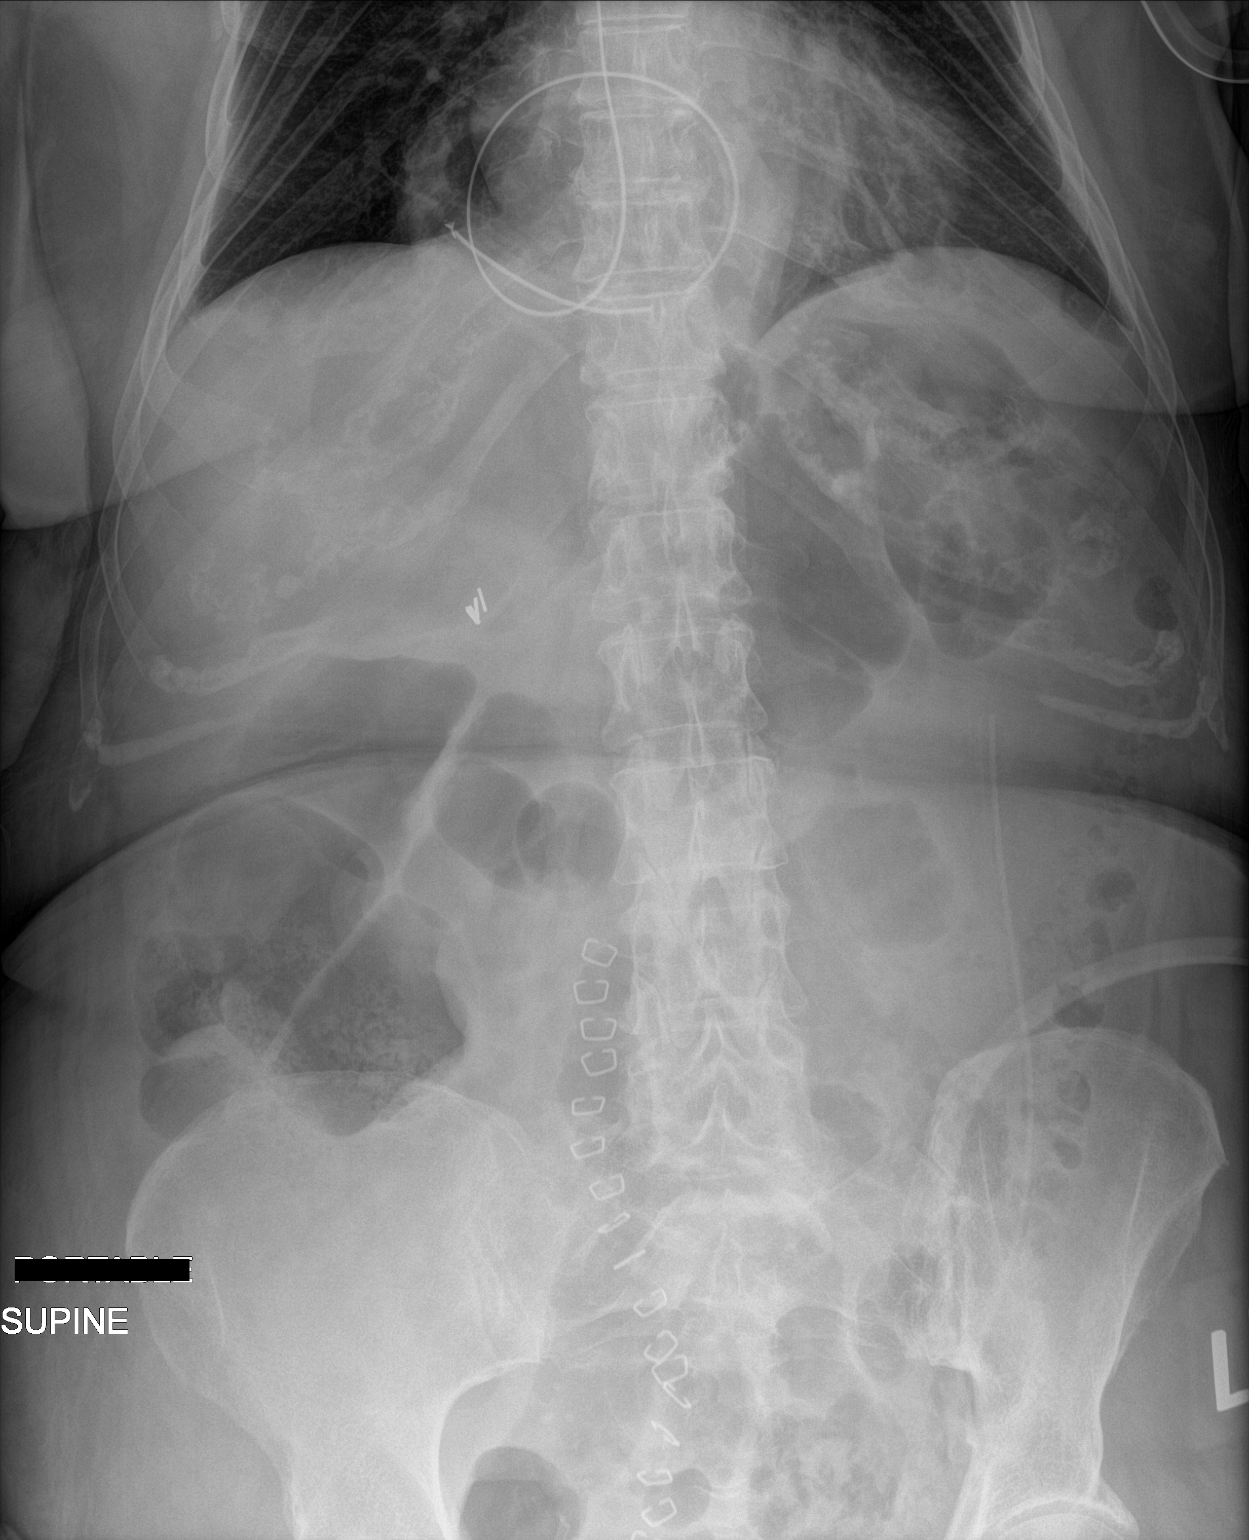

[1 of 1 positions shown; findings below may reference images not displayed]

FINDINGS: Esophagogastric tube is positioned coiled within a hiatal hernia,
tip and side port above the diaphragm. Nonobstructive pattern of
included bowel gas. Midline laparotomy incision. Surgical drain
projects over the left hemiabdomen.
IMPRESSION: Esophagogastric tube is positioned coiled within a hiatal hernia,
tip and side port above the diaphragm. Recommend repositioning and
advancement to ensure subdiaphragmatic positioning.

## 2023-07-15 ENCOUNTER — Other Ambulatory Visit: Payer: Self-pay | Admitting: Family Medicine

## 2023-07-15 DIAGNOSIS — R413 Other amnesia: Secondary | ICD-10-CM

## 2023-08-04 ENCOUNTER — Other Ambulatory Visit: Payer: Self-pay

## 2023-08-04 ENCOUNTER — Emergency Department (HOSPITAL_BASED_OUTPATIENT_CLINIC_OR_DEPARTMENT_OTHER): Payer: Medicare Other

## 2023-08-04 ENCOUNTER — Inpatient Hospital Stay (HOSPITAL_BASED_OUTPATIENT_CLINIC_OR_DEPARTMENT_OTHER)
Admission: EM | Admit: 2023-08-04 | Discharge: 2023-08-08 | DRG: 373 | Disposition: A | Discharge status: Home / Self Care | Payer: Medicare Other | Attending: Internal Medicine | Admitting: Internal Medicine

## 2023-08-04 ENCOUNTER — Encounter (HOSPITAL_BASED_OUTPATIENT_CLINIC_OR_DEPARTMENT_OTHER): Payer: Self-pay | Admitting: Emergency Medicine

## 2023-08-04 DIAGNOSIS — Z87440 Personal history of urinary (tract) infections: Secondary | ICD-10-CM

## 2023-08-04 DIAGNOSIS — D509 Iron deficiency anemia, unspecified: Secondary | ICD-10-CM | POA: Diagnosis present

## 2023-08-04 DIAGNOSIS — K219 Gastro-esophageal reflux disease without esophagitis: Secondary | ICD-10-CM | POA: Diagnosis present

## 2023-08-04 DIAGNOSIS — I10 Essential (primary) hypertension: Secondary | ICD-10-CM | POA: Diagnosis present

## 2023-08-04 DIAGNOSIS — A0471 Enterocolitis due to Clostridium difficile, recurrent: Principal | ICD-10-CM | POA: Diagnosis present

## 2023-08-04 DIAGNOSIS — N390 Urinary tract infection, site not specified: Secondary | ICD-10-CM | POA: Diagnosis present

## 2023-08-04 DIAGNOSIS — R197 Diarrhea, unspecified: Principal | ICD-10-CM

## 2023-08-04 DIAGNOSIS — Z823 Family history of stroke: Secondary | ICD-10-CM

## 2023-08-04 DIAGNOSIS — T50905A Adverse effect of unspecified drugs, medicaments and biological substances, initial encounter: Secondary | ICD-10-CM | POA: Diagnosis not present

## 2023-08-04 DIAGNOSIS — Z809 Family history of malignant neoplasm, unspecified: Secondary | ICD-10-CM

## 2023-08-04 DIAGNOSIS — N2882 Megaloureter: Secondary | ICD-10-CM | POA: Diagnosis not present

## 2023-08-04 DIAGNOSIS — R109 Unspecified abdominal pain: Secondary | ICD-10-CM

## 2023-08-04 DIAGNOSIS — Z881 Allergy status to other antibiotic agents status: Secondary | ICD-10-CM

## 2023-08-04 DIAGNOSIS — K5100 Ulcerative (chronic) pancolitis without complications: Secondary | ICD-10-CM | POA: Diagnosis present

## 2023-08-04 DIAGNOSIS — Z885 Allergy status to narcotic agent status: Secondary | ICD-10-CM

## 2023-08-04 DIAGNOSIS — Z792 Long term (current) use of antibiotics: Secondary | ICD-10-CM

## 2023-08-04 DIAGNOSIS — Z8249 Family history of ischemic heart disease and other diseases of the circulatory system: Secondary | ICD-10-CM

## 2023-08-04 DIAGNOSIS — K208 Other esophagitis without bleeding: Secondary | ICD-10-CM | POA: Diagnosis present

## 2023-08-04 DIAGNOSIS — E876 Hypokalemia: Secondary | ICD-10-CM | POA: Diagnosis not present

## 2023-08-04 DIAGNOSIS — R1032 Left lower quadrant pain: Secondary | ICD-10-CM | POA: Diagnosis not present

## 2023-08-04 DIAGNOSIS — Z90710 Acquired absence of both cervix and uterus: Secondary | ICD-10-CM

## 2023-08-04 DIAGNOSIS — Z79810 Long term (current) use of selective estrogen receptor modulators (SERMs): Secondary | ICD-10-CM

## 2023-08-04 DIAGNOSIS — Z853 Personal history of malignant neoplasm of breast: Secondary | ICD-10-CM

## 2023-08-04 DIAGNOSIS — Z833 Family history of diabetes mellitus: Secondary | ICD-10-CM

## 2023-08-04 DIAGNOSIS — Z800 Family history of malignant neoplasm of digestive organs: Secondary | ICD-10-CM

## 2023-08-04 DIAGNOSIS — K589 Irritable bowel syndrome without diarrhea: Secondary | ICD-10-CM | POA: Diagnosis present

## 2023-08-04 DIAGNOSIS — Z9049 Acquired absence of other specified parts of digestive tract: Secondary | ICD-10-CM

## 2023-08-04 DIAGNOSIS — Z860101 Personal history of adenomatous and serrated colon polyps: Secondary | ICD-10-CM

## 2023-08-04 DIAGNOSIS — E538 Deficiency of other specified B group vitamins: Secondary | ICD-10-CM | POA: Diagnosis present

## 2023-08-04 LAB — CBC WITH DIFFERENTIAL/PLATELET
Abs Immature Granulocytes: 0.03 10*3/uL (ref 0.00–0.07)
Basophils Absolute: 0 10*3/uL (ref 0.0–0.1)
Basophils Relative: 0 %
Eosinophils Absolute: 0 10*3/uL (ref 0.0–0.5)
Eosinophils Relative: 0 %
HCT: 31.6 % — ABNORMAL LOW (ref 36.0–46.0)
Hemoglobin: 10.6 g/dL — ABNORMAL LOW (ref 12.0–15.0)
Immature Granulocytes: 0 %
Lymphocytes Relative: 7 %
Lymphs Abs: 0.5 10*3/uL — ABNORMAL LOW (ref 0.7–4.0)
MCH: 28.9 pg (ref 26.0–34.0)
MCHC: 33.5 g/dL (ref 30.0–36.0)
MCV: 86.1 fL (ref 80.0–100.0)
Monocytes Absolute: 0.2 10*3/uL (ref 0.1–1.0)
Monocytes Relative: 3 %
Neutro Abs: 6.5 10*3/uL (ref 1.7–7.7)
Neutrophils Relative %: 90 %
Platelets: 162 10*3/uL (ref 150–400)
RBC: 3.67 MIL/uL — ABNORMAL LOW (ref 3.87–5.11)
RDW: 13.7 % (ref 11.5–15.5)
WBC: 7.3 10*3/uL (ref 4.0–10.5)
nRBC: 0 % (ref 0.0–0.2)

## 2023-08-04 LAB — URINALYSIS, ROUTINE W REFLEX MICROSCOPIC
Bilirubin Urine: NEGATIVE
Glucose, UA: NEGATIVE mg/dL
Hgb urine dipstick: NEGATIVE
Ketones, ur: 40 mg/dL — AB
Nitrite: NEGATIVE
Protein, ur: NEGATIVE mg/dL
Specific Gravity, Urine: <=1.005 (ref 1.005–1.030)
pH: 5.5 (ref 5.0–8.0)

## 2023-08-04 LAB — COMPREHENSIVE METABOLIC PANEL
ALT: 29 U/L (ref 0–44)
AST: 41 U/L (ref 15–41)
Albumin: 3.2 g/dL — ABNORMAL LOW (ref 3.5–5.0)
Alkaline Phosphatase: 51 U/L (ref 38–126)
Anion gap: 10 (ref 5–15)
BUN: 15 mg/dL (ref 8–23)
CO2: 20 mmol/L — ABNORMAL LOW (ref 22–32)
Calcium: 8.2 mg/dL — ABNORMAL LOW (ref 8.9–10.3)
Chloride: 104 mmol/L (ref 98–111)
Creatinine, Ser: 0.86 mg/dL (ref 0.44–1.00)
GFR, Estimated: >60 mL/min (ref >60)
Glucose, Bld: 138 mg/dL — ABNORMAL HIGH (ref 70–99)
Potassium: 3.5 mmol/L (ref 3.5–5.1)
Sodium: 134 mmol/L — ABNORMAL LOW (ref 135–145)
Total Bilirubin: 0.7 mg/dL (ref 0.0–1.2)
Total Protein: 6.5 g/dL (ref 6.5–8.1)

## 2023-08-04 LAB — URINALYSIS, MICROSCOPIC (REFLEX): RBC / HPF: NONE SEEN RBC/hpf (ref 0–5)

## 2023-08-04 LAB — C DIFFICILE QUICK SCREEN W PCR REFLEX
C Diff antigen: POSITIVE — AB
C Diff interpretation: DETECTED
C Diff toxin: POSITIVE — AB

## 2023-08-04 MED ORDER — ENOXAPARIN SODIUM 40 MG/0.4ML IJ SOSY
40.0000 mg | PREFILLED_SYRINGE | INTRAMUSCULAR | Status: DC
Start: 2023-08-04 — End: 2023-08-08
  Administered 2023-08-04 – 2023-08-07 (×4): 40 mg via SUBCUTANEOUS
  Filled 2023-08-04 (×4): qty 0.4

## 2023-08-04 MED ORDER — SENNOSIDES-DOCUSATE SODIUM 8.6-50 MG PO TABS: 1.0000 | ORAL_TABLET | Freq: Every evening | ORAL | Status: DC | PRN

## 2023-08-04 MED ORDER — ONDANSETRON HCL 4 MG/2ML IJ SOLN
4.0000 mg | Freq: Four times a day (QID) | INTRAMUSCULAR | Status: DC | PRN
  Administered 2023-08-06 – 2023-08-08 (×3): 4 mg via INTRAVENOUS
  Filled 2023-08-04 (×3): qty 2

## 2023-08-04 MED ORDER — TAMOXIFEN CITRATE 10 MG PO TABS
20.0000 mg | ORAL_TABLET | Freq: Every day | ORAL | Status: DC
  Administered 2023-08-05 – 2023-08-08 (×4): 20 mg via ORAL
  Filled 2023-08-04 (×4): qty 2

## 2023-08-04 MED ORDER — ACETAMINOPHEN 325 MG PO TABS: 650.0000 mg | ORAL_TABLET | Freq: Four times a day (QID) | ORAL | Status: DC | PRN

## 2023-08-04 MED ORDER — ACETAMINOPHEN 650 MG RE SUPP: 650.0000 mg | Freq: Four times a day (QID) | RECTAL | Status: DC | PRN

## 2023-08-04 MED ORDER — SODIUM CHLORIDE 0.9 % IV BOLUS
1000.0000 mL | Freq: Once | INTRAVENOUS | Status: AC
  Administered 2023-08-04: 1000 mL via INTRAVENOUS

## 2023-08-04 MED ORDER — ONDANSETRON HCL 4 MG PO TABS: 4.0000 mg | ORAL_TABLET | Freq: Four times a day (QID) | ORAL | Status: DC | PRN

## 2023-08-04 MED ORDER — IOHEXOL 300 MG/ML  SOLN
75.0000 mL | Freq: Once | INTRAMUSCULAR | Status: AC | PRN
  Administered 2023-08-04: 75 mL via INTRAVENOUS

## 2023-08-04 NOTE — ED Provider Notes (Signed)
 [facility] Provider Note  CSN: [id number] Arrival date & time: 08/04/23 1136  Chief Complaint(s) Diarrhea  HPI Jasmine Cantu is a 83 y.o. female with past medical history as below, significant for recurrent cdiff, htn, IBS, IDA who presents to the ED with complaint of diarrhea, abd pain.  Abd pain last 24-48 hours, copious diarrhea last 24-48 hours. Liquid, no formed stool. She recently completed oral vanco around 2 wks ago. Was recently treated for UTI with oral antibiotic. UTI symptoms have since resolved but diarrhea has returned. No vomiting, no fever, no melena or brpbr. Feels like prior bouts of cdiff per pt/daughter at bedside  Past Medical History Past Medical History:  Diagnosis Date   Allergy    Anemia    C. difficile diarrhea    Cataract    Collagenous colitis 2004   Diverticulosis    Esophageal stricture 1988   GERD (gastroesophageal reflux disease)    H. pylori infection    Hiatal hernia    Hypertension    IBS (irritable bowel syndrome)    Iron deficiency anemia    Tubular adenoma of colon    Ulcer    Vitamin B12 deficiency    Patient Active Problem List   Diagnosis Date Noted   C. 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 Authorizing Provider  acetaminophen  (TYLENOL ) 500 MG tablet Take 500 mg by mouth every 6 (six) hours as needed for mild pain.    [provider]  benazepril  (LOTENSIN ) 10 MG tablet Take 10 mg by mouth daily. Patient not taking: Reported on  06/07/2023    [provider]  calcium carbonate (TUMS - DOSED IN MG ELEMENTAL CALCIUM) 500 MG chewable tablet Chew 1 tablet by mouth daily. Patient not taking: Reported on 06/07/2023    [provider]  Cholecalciferol (VITAMIN D3) 50 MCG (2000 UT) capsule Take 200 Units by mouth daily. Patient not taking: Reported on 06/07/2023    [provider]  cholestyramine  (QUESTRAN ) 4 g packet Take 1 packet (4 g total) by mouth 3 (three) times daily for 7 days. 04/29/23 05/06/23  Arlice Reichert, MD  cyanocobalamin (,VITAMIN B-12,) 1000 MCG/ML injection Inject 1,000 mcg into the muscle every 30 (thirty) days. 11/28/21   [provider]  ferrous sulfate 325 (65 FE) MG EC tablet Take 325 mg by mouth daily with breakfast.    [provider]  hydrocortisone (ANUSOL-HC) 2.5 % rectal cream Apply 1 Application topically 2 (two) times daily. 05/24/23   [provider]  tamoxifen  (NOLVADEX ) 20 MG tablet Take 1 tablet (20 mg total) by mouth daily. 03/07/23   Crawford Morna Pickle, NP  traMADol  (ULTRAM ) 50 MG tablet Take 1-2 tablets (50-100 mg total) by mouth every 6 (six) hours as needed for moderate pain or severe pain. Patient not taking: Reported on 06/07/2023 01/28/23   Aron Shoulders, MD  vancomycin  (VANCOCIN ) 125 MG capsule Take by mouth. 05/30/23   [provider]  ipratropium (ATROVENT ) 0.06 % nasal spray Place 2 sprays into both nostrils 4 (four) times daily.  08/05/19 11/19/19  Babara Greig GAILS, PA-C                                                                                                                                    Past Surgical History Past Surgical History:  Procedure Laterality Date   APPENDECTOMY     BLADDER SUSPENSION     BREAST BIOPSY Right 01/10/2023   US  RT BREAST BX W LOC DEV 1ST LESION IMG BX SPEC US  GUIDE 01/10/2023 [facility]   BREAST BIOPSY  01/25/2023   US  RT RADIOACTIVE SEED LOC 01/25/2023 [facility]   BREAST  LUMPECTOMY WITH RADIOACTIVE SEED LOCALIZATION Right 01/28/2023   Procedure: RIGHT BREAST LUMPECTOMY WITH RADIOACTIVE SEED LOCALIZATION;  Surgeon: Aron Shoulders, MD;  Location: [facility];  Service: General;  Laterality: Right;   CHOLECYSTECTOMY     EYE SURGERY     LAPAROTOMY N/A 01/08/2022   Procedure: EXPLORATORY LAPAROTOMY, SMALL BOWEL RESECTION;  Surgeon: Ebbie Cough, MD;  Location: [facility];  Service: General;  Laterality: N/A;   VAGINAL HYSTERECTOMY     Family History Family History  Problem Relation Age of Onset   Diabetes Mother    Colon cancer Mother    Stroke Father    Cancer Sister    Cancer Brother    Heart attack Brother    Cancer Brother    Heart attack Brother     Social History Social History   Tobacco Use   Smoking status: Never   Smokeless tobacco: Never  Vaping Use   Vaping status: Never Used  Substance Use Topics   Alcohol use: No   Drug use: No   Allergies Doxycycline , Codeine, and Hydrocodone   Review of Systems Review of Systems  Constitutional:  Positive for appetite change. Negative for chills and fever.  Respiratory:  Negative for chest tightness and shortness of breath.   Cardiovascular:  Negative for chest pain.  Gastrointestinal:  Positive for abdominal pain and diarrhea. Negative for nausea and vomiting.  Genitourinary:  Negative for dysuria and urgency.  Musculoskeletal:  Negative for arthralgias and back pain.  Skin:  Negative for rash.  Neurological:  Negative for syncope.  All other systems reviewed and are negative.   Physical Exam Vital Signs  I have reviewed the triage vital signs BP (!) 125/44   Pulse 63   Temp 98.2 F (36.8 C) (Oral)   Resp 17   Ht 5' 3 (1.6 m)   Wt 56.7 kg   SpO2 97%   BMI 22.14 kg/m  Physical Exam Vitals and nursing note reviewed.  Constitutional:      General: She is not in acute distress.    Appearance: Normal appearance. She is well-developed. She is not ill-appearing.   HENT:     Head: Normocephalic and atraumatic.     Right Ear: External ear normal.     Left Ear: External ear normal.  Nose: Nose normal.     Mouth/Throat:     Mouth: Mucous membranes are moist.  Eyes:     General: No scleral icterus.       Right eye: No discharge.        Left eye: No discharge.  Cardiovascular:     Rate and Rhythm: Normal rate.  Pulmonary:     Effort: Pulmonary effort is normal. No tachypnea or respiratory distress.     Breath sounds: No stridor.  Abdominal:     General: Abdomen is flat. There is no distension.     Tenderness: There is abdominal tenderness. There is no guarding.    Musculoskeletal:        General: No deformity.     Cervical back: No rigidity.  Skin:    General: Skin is warm and dry.     Coloration: Skin is not cyanotic, jaundiced or pale.  Neurological:     Mental Status: She is alert and oriented to person, place, and time.     GCS: GCS eye subscore is 4. GCS verbal subscore is 5. GCS motor subscore is 6.  Psychiatric:        Speech: Speech normal.        Behavior: Behavior normal. Behavior is cooperative.     ED Results and Treatments Labs (all labs ordered are listed, but only abnormal results are displayed) Labs Reviewed  COMPREHENSIVE METABOLIC PANEL - Abnormal; Notable for the following components:      Result Value   Sodium 134 (*)    CO2 20 (*)    Glucose, Bld 138 (*)    Calcium 8.2 (*)    Albumin 3.2 (*)    All other components within normal limits  CBC WITH DIFFERENTIAL/PLATELET - Abnormal; Notable for the following components:   RBC 3.67 (*)    Hemoglobin 10.6 (*)    HCT 31.6 (*)    Lymphs Abs 0.5 (*)    All other components within normal limits  C DIFFICILE QUICK SCREEN W PCR REFLEX    CBC WITH DIFFERENTIAL/PLATELET  URINALYSIS, ROUTINE W REFLEX MICROSCOPIC                                                                                                                          Radiology No results  found.  Pertinent labs & imaging results that were available during my care of the patient were reviewed by me and considered in my medical decision making (see MDM for details).  Medications Ordered in ED Medications  iohexol  (OMNIPAQUE ) 300 MG/ML solution 75 mL (has no administration in time range)  sodium chloride  0.9 % bolus 1,000 mL (1,000 mLs Intravenous New Bag/Given 08/04/23 1437)  Procedures Procedures  (including critical care time)  Medical Decision Making / ED Course    Medical Decision Making:    Rowena Moilanen is a 83 y.o. female  with past medical history as below, significant for recurrent cdiff, htn, IBS, IDA who presents to the ED with complaint of diarrhea, abd pain.. The complaint involves an extensive differential diagnosis and also carries with it a high risk of complications and morbidity.  Serious etiology was considered. Ddx includes but is not limited to: Differential diagnosis includes but is not exclusive to ectopic pregnancy, ovarian cyst, ovarian torsion, acute appendicitis, urinary tract infection, endometriosis, bowel obstruction, hernia, colitis, renal colic, gastroenteritis, volvulus etc.   Complete initial physical exam performed, notably the patient was in no distress, not peritoneal, hds.    Reviewed and confirmed nursing documentation for past medical history, family history, social history.  Vital signs reviewed.         Brief summary: 83 yo female w/ hx as above including c-diff colitis here with LLQ abd pain and profuse diarrhea. No n/v. Labs reviewed and are stable. Cdiff pcr pending, CT a/p pending. Given IVF. Handoff to incoming EDP pending remainder of workup. She has recently completed oral vancomycin  from last episode of cdiff.                 Additional history obtained: -Additional history  obtained from family -External records from outside source obtained and reviewed including: Chart review including previous notes, labs, imaging, consultation notes including  Prior admission, primary care documentation, prior labs/imaging/home meds   Lab Tests: -I ordered, reviewed, and interpreted labs.   The pertinent results include:   Labs Reviewed  COMPREHENSIVE METABOLIC PANEL - Abnormal; Notable for the following components:      Result Value   Sodium 134 (*)    CO2 20 (*)    Glucose, Bld 138 (*)    Calcium 8.2 (*)    Albumin 3.2 (*)    All other components within normal limits  CBC WITH DIFFERENTIAL/PLATELET - Abnormal; Notable for the following components:   RBC 3.67 (*)    Hemoglobin 10.6 (*)    HCT 31.6 (*)    Lymphs Abs 0.5 (*)    All other components within normal limits  C DIFFICILE QUICK SCREEN W PCR REFLEX    CBC WITH DIFFERENTIAL/PLATELET  URINALYSIS, ROUTINE W REFLEX MICROSCOPIC    Notable for labs stable   EKG   EKG Interpretation Date/Time:    Ventricular Rate:    PR Interval:    QRS Duration:    QT Interval:    QTC Calculation:   R Axis:      Text Interpretation:           Imaging Studies ordered: I ordered imaging studies including CT a/p >> pending   Medicines ordered and prescription drug management: Meds ordered this encounter  Medications   sodium chloride  0.9 % bolus 1,000 mL   iohexol  (OMNIPAQUE ) 300 MG/ML solution 75 mL    -I have reviewed the patients home medicines and have made adjustments as needed   Consultations Obtained: na   Cardiac Monitoring: Continuous pulse oximetry interpreted by myself, 99% on RA.    Social Determinants of Health:  Diagnosis or treatment significantly limited by social determinants of health: lives at home   Reevaluation: After the interventions noted above, I reevaluated the patient and found that they have stayed the same  Co morbidities that complicate the patient  evaluation  Past Medical  History:  Diagnosis Date   Allergy    Anemia    C. difficile diarrhea    Cataract    Collagenous colitis 2004   Diverticulosis    Esophageal stricture 1988   GERD (gastroesophageal reflux disease)    H. pylori infection    Hiatal hernia    Hypertension    IBS (irritable bowel syndrome)    Iron deficiency anemia    Tubular adenoma of colon    Ulcer    Vitamin B12 deficiency       Dispostion: Disposition decision including need for hospitalization was considered, and patient disposition pending at time of sign out.    Final Clinical Impression(s) / ED Diagnoses Final diagnoses:  Diarrhea, unspecified type  Abdominal pain, unspecified abdominal location        Elnor Jayson LABOR, DO 08/04/23 1535

## 2023-08-04 NOTE — ED Notes (Signed)
Called [facility] for transport 

## 2023-08-04 NOTE — ED Triage Notes (Signed)
 Pt with diarrhea x 3-4 days; hx of multiple episodes of C Diff

## 2023-08-04 NOTE — ED Notes (Signed)
 ED Provider at bedside.

## 2023-08-04 NOTE — Progress Notes (Signed)
 Pt just arrived to [facility]   pt in bed  skin check completed at bedside with Courtney Paris   skin intact  pt alert and oriented  daughter at bedside.  safety measures in place  Kalida given report at bedside

## 2023-08-04 NOTE — ED Notes (Signed)
 Pt unable to provide urine sample at this time

## 2023-08-04 NOTE — H&P (Signed)
 History and Physical    Patient: Jasmine Cantu FMW:[id number] DOB: [birth year] DOA: 08/04/2023 DOS: the patient was seen and examined on 08/04/2023 PCP: Teresa Channel, MD  Patient coming from: [facility]  Chief Complaint:  Chief Complaint  Patient presents with   Diarrhea   HPI: Jasmine Cantu is a 83 y.o. female with medical history significant of C. difficile infection, HTN, stage IA right breast cancer s/p lumpectomy on tamoxifen , bowel ischemia with perforation s/p ex lap and small bowel resection June 2023, GERD, vitamin B12 deficiency, and IDA who presented to the [facility] ED for evaluation of diarrhea. She reports she had C. difficile infection 2 times in October last year and was treated with vancomycin . She was recently diagnosed with UTI 3 weeks ago and completed 7-day course of Ceftin 2 weeks ago. Yesterday morning, she had one loose stool but none throughout the day. Last night, she started having watery stools described as greenish, slimy and liquidy. She reports waking up 4-5 times to use the bathroom. She reports mild abdominal discomfort chronic urinary frequency but denies any nausea, vomiting, dysuria, bloody stools, dizziness, shortness of breath, chest pain, fevers or chills.  [facility] ED course: Initial vitals with temp 98.2, RR 18, HR 70, BP 135/53, SpO2 98% on room air Labs show sodium 134, K+ 3.5, bicarb 20, creatinine 0.86, albumin 3.2, WBC 7.3, Hgb 10.6, platelet 162, UA shows mild ketonuria but no signs of infection CT A/P shows submucosal edema and inflammation involving the entire colon to the rectum consistent with pancolitis as well as high density material within the right renal pelvis and dilated ureter concerning for blood within the ureter.  Infectious disease was consulted and they recommended C. difficile testing prior to treatment Patient received IV NS 1 L bolus Patient was admitted to [facility] service and transferred to [facility]  Review  of Systems: As mentioned in the history of present illness. All other systems reviewed and are negative. Past Medical History:  Diagnosis Date   Allergy    Anemia    C. difficile diarrhea    Cataract    Collagenous colitis 2004   Diverticulosis    Esophageal stricture 1988   GERD (gastroesophageal reflux disease)    H. pylori infection    Hiatal hernia    Hypertension    IBS (irritable bowel syndrome)    Iron deficiency anemia    Tubular adenoma of colon    Ulcer    Vitamin B12 deficiency    Past Surgical History:  Procedure Laterality Date   APPENDECTOMY     BLADDER SUSPENSION     BREAST BIOPSY Right 01/10/2023   US  RT BREAST BX W LOC DEV 1ST LESION IMG BX SPEC US  GUIDE 01/10/2023 [facility]   BREAST BIOPSY  01/25/2023   US  RT RADIOACTIVE SEED LOC 01/25/2023 [facility]   BREAST LUMPECTOMY WITH RADIOACTIVE SEED LOCALIZATION Right 01/28/2023   Procedure: RIGHT BREAST LUMPECTOMY WITH RADIOACTIVE SEED LOCALIZATION;  Surgeon: Aron Shoulders, MD;  Location: [facility];  Service: General;  Laterality: Right;   CHOLECYSTECTOMY     EYE SURGERY     LAPAROTOMY N/A 01/08/2022   Procedure: EXPLORATORY LAPAROTOMY, SMALL BOWEL RESECTION;  Surgeon: Ebbie Cough, MD;  Location: [facility];  Service: General;  Laterality: N/A;   VAGINAL HYSTERECTOMY     Social History:  reports that she has never smoked. She has never used smokeless tobacco. She reports that she does not drink alcohol and does not  use drugs.  Allergies  Allergen Reactions   Doxycycline  Nausea And Vomiting   Codeine Other (See Comments)    GI upset   Hydrocodone  Nausea Only    Family History  Problem Relation Age of Onset   Diabetes Mother    Colon cancer Mother    Stroke Father    Cancer Sister    Cancer Brother    Heart attack Brother    Cancer Brother    Heart attack Brother     Prior to Admission medications   Medication Sig Start Date End Date Taking? Authorizing Provider   acetaminophen  (TYLENOL ) 500 MG tablet Take 500 mg by mouth every 6 (six) hours as needed for mild pain.    [provider]  benazepril  (LOTENSIN ) 10 MG tablet Take 10 mg by mouth daily. Patient not taking: Reported on 06/07/2023    [provider]  calcium carbonate (TUMS - DOSED IN MG ELEMENTAL CALCIUM) 500 MG chewable tablet Chew 1 tablet by mouth daily. Patient not taking: Reported on 06/07/2023    [provider]  Cholecalciferol (VITAMIN D3) 50 MCG (2000 UT) capsule Take 200 Units by mouth daily. Patient not taking: Reported on 06/07/2023    [provider]  cholestyramine  (QUESTRAN ) 4 g packet Take 1 packet (4 g total) by mouth 3 (three) times daily for 7 days. 04/29/23 05/06/23  Arlice Reichert, MD  cyanocobalamin (,VITAMIN B-12,) 1000 MCG/ML injection Inject 1,000 mcg into the muscle every 30 (thirty) days. 11/28/21   [provider]  ferrous sulfate 325 (65 FE) MG EC tablet Take 325 mg by mouth daily with breakfast.    [provider]  hydrocortisone (ANUSOL-HC) 2.5 % rectal cream Apply 1 Application topically 2 (two) times daily. 05/24/23   [provider]  tamoxifen  (NOLVADEX ) 20 MG tablet Take 1 tablet (20 mg total) by mouth daily. 03/07/23   Crawford Morna Pickle, NP  traMADol  (ULTRAM ) 50 MG tablet Take 1-2 tablets (50-100 mg total) by mouth every 6 (six) hours as needed for moderate pain or severe pain. Patient not taking: Reported on 06/07/2023 01/28/23   Aron Shoulders, MD  vancomycin  (VANCOCIN ) 125 MG capsule Take by mouth. 05/30/23   [provider]  ipratropium (ATROVENT ) 0.06 % nasal spray Place 2 sprays into both nostrils 4 (four) times daily. 08/05/19 11/19/19  Babara Greig GAILS, PA-C    Physical Exam: Vitals:   08/04/23 1500 08/04/23 1557 08/04/23 1759 08/04/23 1911  BP: (!) 125/44  (!) 130/44 (!) 129/46  Pulse: 63  60 66  Resp: 17  18   Temp:  98.4 F (36.9 C) 97.8 F (36.6 C) (!) 97.4 F (36.3 C)  TempSrc:   Oral Oral   SpO2: 97%  99% 99%  Weight:      Height:       General: Pleasant, well-appearing elderly woman laying in bed. No acute distress. HEENT: [place]/AT. Anicteric sclera. Dry mucous membrane CV: RRR. No murmurs, rubs, or gallops. No LE edema Pulmonary: Lungs CTAB. Normal effort. No wheezing or rales. Abdominal: Soft, nondistended. Mild ttp of the lower abdomen. Normal bowel sounds. Extremities: Palpable radial and DP pulses. Normal ROM. Skin: Warm and dry. No obvious rash or lesions. Neuro: A&Ox3. Moves all extremities. Normal sensation to light touch. No focal deficit. Psych: Normal mood and affect  Data Reviewed: Labs show sodium 134, K+ 3.5, bicarb 20, creatinine 0.86, albumin 3.2, WBC 7.3, Hgb 10.6, platelet 162, UA shows mild ketonuria but no signs of infection CT A/P  shows submucosal edema and inflammation involving the entire colon to the rectum consistent with pancolitis as well as high density material within the right renal pelvis and dilated ureter concerning for blood within the ureter.   Assessment and Plan: Jasmine Cantu is a 83 y.o. female with medical history significant of C. difficile infection, HTN, stage IA right breast cancer s/p lumpectomy on tamoxifen , bowel ischemia with perforation s/p ex lap and small bowel resection June 2023, GERD, vitamin B12 deficiency, and IDA who presented to the [facility] ED for evaluation of diarrhea  # Pancolitis # Hx of C. difficile infection Patient with 2 episodes of C. difficile infection months ago now presenting with acute onset of watery diarrhea. CT A/P shows evidence of pancolitis. Patient with no bowel movement since arrival to the ED. Minimal abdominal tenderness on exam. Slightly dry on exam. She is afebrile with no leukocytosis. Status post 1 L IV NS in the ED. -Admit for observation -Infectious disease consulted, recommended C. difficile testing prior to treatment -Follow-up GI panel, C. difficile test -Additional  IV NS 1 L bolus -Enteric precautions  # Dilated R ureter CT A/P shows evidence of high density material within the right renal pelvis and dilated ureter concerning for blood within the ureter. Patient reports history of urinary frequency but denies any dysuria or hematuria. UA with no signs of hematuria or acute infection. -Urology consulted, appreciate recs  # HTN BP stable with SBP in the 120s to 130s. Per her daughter, patient was taken off her BP meds due to normotensive BP off of her medications. -CTM  # Breast cancer Stage IA right breast cancer diagnosed last year now status post lumpectomy and on tamoxifen  since August 2024. -Continue tamoxifen  -Follow-up with oncology in the outpatient  # Vitamin B12 deficiency # Iron deficiency anemia Hemoglobin of 10.6 around her baseline of 10. -Continue outpatient B12 injections    Advance Care Planning:   Code Status: Full Code   Consults: Infectious disease, Urology  Family Communication: Discussed admission with daughter at bedside  Severity of Illness: The appropriate patient status for this patient is OBSERVATION. Observation status is judged to be reasonable and necessary in order to provide the required intensity of service to ensure the patient's safety. The patient's presenting symptoms, physical exam findings, and initial radiographic and laboratory data in the context of their medical condition is felt to place them at decreased risk for further clinical deterioration. Furthermore, it is anticipated that the patient will be medically stable for discharge from the hospital within 2 midnights of admission.   Author: Claretta CHRISTELLA Alderman, MD 08/04/2023 8:15 PM  For on call review [email or web address].

## 2023-08-05 DIAGNOSIS — Z792 Long term (current) use of antibiotics: Secondary | ICD-10-CM | POA: Diagnosis not present

## 2023-08-05 DIAGNOSIS — A0471 Enterocolitis due to Clostridium difficile, recurrent: Secondary | ICD-10-CM | POA: Diagnosis present

## 2023-08-05 DIAGNOSIS — Z800 Family history of malignant neoplasm of digestive organs: Secondary | ICD-10-CM | POA: Diagnosis not present

## 2023-08-05 DIAGNOSIS — Z823 Family history of stroke: Secondary | ICD-10-CM | POA: Diagnosis not present

## 2023-08-05 DIAGNOSIS — K589 Irritable bowel syndrome without diarrhea: Secondary | ICD-10-CM | POA: Diagnosis present

## 2023-08-05 DIAGNOSIS — Z881 Allergy status to other antibiotic agents status: Secondary | ICD-10-CM | POA: Diagnosis not present

## 2023-08-05 DIAGNOSIS — Z885 Allergy status to narcotic agent status: Secondary | ICD-10-CM | POA: Diagnosis not present

## 2023-08-05 DIAGNOSIS — Z853 Personal history of malignant neoplasm of breast: Secondary | ICD-10-CM | POA: Diagnosis not present

## 2023-08-05 DIAGNOSIS — K219 Gastro-esophageal reflux disease without esophagitis: Secondary | ICD-10-CM | POA: Diagnosis present

## 2023-08-05 DIAGNOSIS — Z79810 Long term (current) use of selective estrogen receptor modulators (SERMs): Secondary | ICD-10-CM | POA: Diagnosis not present

## 2023-08-05 DIAGNOSIS — Z90710 Acquired absence of both cervix and uterus: Secondary | ICD-10-CM | POA: Diagnosis not present

## 2023-08-05 DIAGNOSIS — T50905A Adverse effect of unspecified drugs, medicaments and biological substances, initial encounter: Secondary | ICD-10-CM | POA: Diagnosis not present

## 2023-08-05 DIAGNOSIS — Z8249 Family history of ischemic heart disease and other diseases of the circulatory system: Secondary | ICD-10-CM | POA: Diagnosis not present

## 2023-08-05 DIAGNOSIS — Z87440 Personal history of urinary (tract) infections: Secondary | ICD-10-CM | POA: Diagnosis not present

## 2023-08-05 DIAGNOSIS — N2882 Megaloureter: Secondary | ICD-10-CM | POA: Diagnosis present

## 2023-08-05 DIAGNOSIS — R1032 Left lower quadrant pain: Secondary | ICD-10-CM | POA: Diagnosis present

## 2023-08-05 DIAGNOSIS — K208 Other esophagitis without bleeding: Secondary | ICD-10-CM | POA: Diagnosis present

## 2023-08-05 DIAGNOSIS — D509 Iron deficiency anemia, unspecified: Secondary | ICD-10-CM | POA: Diagnosis present

## 2023-08-05 DIAGNOSIS — Z860101 Personal history of adenomatous and serrated colon polyps: Secondary | ICD-10-CM | POA: Diagnosis not present

## 2023-08-05 DIAGNOSIS — K5100 Ulcerative (chronic) pancolitis without complications: Secondary | ICD-10-CM | POA: Diagnosis not present

## 2023-08-05 DIAGNOSIS — Z833 Family history of diabetes mellitus: Secondary | ICD-10-CM | POA: Diagnosis not present

## 2023-08-05 DIAGNOSIS — E538 Deficiency of other specified B group vitamins: Secondary | ICD-10-CM | POA: Diagnosis present

## 2023-08-05 DIAGNOSIS — Z809 Family history of malignant neoplasm, unspecified: Secondary | ICD-10-CM | POA: Diagnosis not present

## 2023-08-05 DIAGNOSIS — I10 Essential (primary) hypertension: Secondary | ICD-10-CM | POA: Diagnosis present

## 2023-08-05 DIAGNOSIS — Z9049 Acquired absence of other specified parts of digestive tract: Secondary | ICD-10-CM | POA: Diagnosis not present

## 2023-08-05 DIAGNOSIS — E876 Hypokalemia: Secondary | ICD-10-CM | POA: Diagnosis not present

## 2023-08-05 LAB — BASIC METABOLIC PANEL
Anion gap: 6 (ref 5–15)
BUN: 12 mg/dL (ref 8–23)
CO2: 23 mmol/L (ref 22–32)
Calcium: 8 mg/dL — ABNORMAL LOW (ref 8.9–10.3)
Chloride: 111 mmol/L (ref 98–111)
Creatinine, Ser: 0.83 mg/dL (ref 0.44–1.00)
GFR, Estimated: >60 mL/min (ref >60)
Glucose, Bld: 92 mg/dL (ref 70–99)
Potassium: 3.4 mmol/L — ABNORMAL LOW (ref 3.5–5.1)
Sodium: 140 mmol/L (ref 135–145)

## 2023-08-05 LAB — CBC
HCT: 28.9 % — ABNORMAL LOW (ref 36.0–46.0)
Hemoglobin: 9.4 g/dL — ABNORMAL LOW (ref 12.0–15.0)
MCH: 29.3 pg (ref 26.0–34.0)
MCHC: 32.5 g/dL (ref 30.0–36.0)
MCV: 90 fL (ref 80.0–100.0)
Platelets: 135 10*3/uL — ABNORMAL LOW (ref 150–400)
RBC: 3.21 MIL/uL — ABNORMAL LOW (ref 3.87–5.11)
RDW: 13.6 % (ref 11.5–15.5)
WBC: 4 10*3/uL (ref 4.0–10.5)
nRBC: 0 % (ref 0.0–0.2)

## 2023-08-05 MED ORDER — VANCOMYCIN HCL 125 MG PO CAPS: 125.0000 mg | ORAL_CAPSULE | ORAL | Status: DC

## 2023-08-05 MED ORDER — VANCOMYCIN HCL 125 MG PO CAPS: 125.0000 mg | ORAL_CAPSULE | Freq: Two times a day (BID) | ORAL | Status: DC

## 2023-08-05 MED ORDER — ZOLPIDEM TARTRATE 5 MG PO TABS
5.0000 mg | ORAL_TABLET | Freq: Every evening | ORAL | Status: DC | PRN
  Administered 2023-08-08: 5 mg via ORAL
  Filled 2023-08-05: qty 1

## 2023-08-05 MED ORDER — VANCOMYCIN HCL 125 MG PO CAPS
125.0000 mg | ORAL_CAPSULE | Freq: Four times a day (QID) | ORAL | Status: DC
  Administered 2023-08-05 – 2023-08-06 (×5): 125 mg via ORAL
  Filled 2023-08-05 (×7): qty 1

## 2023-08-05 MED ORDER — POTASSIUM CHLORIDE CRYS ER 20 MEQ PO TBCR
40.0000 meq | EXTENDED_RELEASE_TABLET | ORAL | Status: AC
  Administered 2023-08-05 (×2): 40 meq via ORAL
  Filled 2023-08-05 (×2): qty 2

## 2023-08-05 MED ORDER — VANCOMYCIN HCL 125 MG PO CAPS: 125.0000 mg | ORAL_CAPSULE | Freq: Every day | ORAL | Status: DC

## 2023-08-05 NOTE — Plan of Care (Signed)
  Problem: Education: Goal: Knowledge of General Education information will improve Description: Including pain rating scale, medication(s)/side effects and non-pharmacologic comfort measures Outcome: Progressing   Problem: Activity: Goal: Risk for activity intolerance will decrease Outcome: Progressing   Problem: Nutrition: Goal: Adequate nutrition will be maintained Outcome: Progressing   Problem: Coping: Goal: Level of anxiety will decrease Outcome: Progressing   Problem: Health Behavior/Discharge Planning: Goal: Ability to manage health-related needs will improve Outcome: Not Progressing   Problem: Clinical Measurements: Goal: Ability to maintain clinical measurements within normal limits will improve Outcome: Not Progressing Goal: Will remain free from infection Outcome: Not Progressing Goal: Diagnostic test results will improve Outcome: Not Progressing

## 2023-08-05 NOTE — Plan of Care (Signed)

## 2023-08-05 NOTE — Consult Note (Signed)
 Urology Consult Note   Requesting Attending Physician:  Vernon Ranks, MD Service Providing Consult: Urology  Consulting Attending: Dr. Cam   Reason for Consult:  High-density material within the right renal pelvis and dilated ureter  HPI: Jasmine Cantu is seen in consultation for reasons noted above at the request of Vernon Ranks, MD. [name] significant for C. 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 Pt was transferred to [facility] for higher level of care. CT A/P noted high-density material within the right renal pelvis and dilated ureter.   On my arrival pt was alert, oriented, and in no distress. She was accompanied by her two daughters. Pt denies ever observing hematuria, though daughters reports memory issues which likely prevent her from being an accurate historian.   ------------------  Assessment:  83 y.o. female with possible blood products in the right collecting system dilated right ureter.    Recommendations: #dilated ureter #undifferentiated debris in right ureter and right collecting system  Trend labs, preserved renal function at this time String of bottles Outpt diagnostic ureteroscopy Dr. Cam working on outpt follow up  Urology will follow along peripherally  Case and plan discussed with Dr. Cam  Past Medical History: Past Medical History:  Diagnosis Date   Allergy    Anemia    C. difficile diarrhea    Cataract    Collagenous colitis 2004   Diverticulosis    Esophageal stricture 1988   GERD (gastroesophageal reflux disease)    H. pylori infection    Hiatal hernia    Hypertension    IBS (irritable bowel syndrome)    Iron deficiency anemia    Tubular adenoma of colon    Ulcer    Vitamin B12 deficiency      Past Surgical History:  Past Surgical History:  Procedure Laterality Date   APPENDECTOMY     BLADDER SUSPENSION     BREAST BIOPSY Right 01/10/2023   US  RT BREAST BX W LOC DEV 1ST LESION IMG BX SPEC US  GUIDE 01/10/2023 [facility]   BREAST BIOPSY  01/25/2023   US  RT RADIOACTIVE SEED LOC 01/25/2023 [facility]   BREAST LUMPECTOMY WITH RADIOACTIVE SEED LOCALIZATION Right 01/28/2023   Procedure: RIGHT BREAST LUMPECTOMY WITH RADIOACTIVE SEED LOCALIZATION;  Surgeon: Aron Shoulders, MD;  Location: [facility];  Service: General;  Laterality: Right;   CHOLECYSTECTOMY     EYE SURGERY     LAPAROTOMY N/A 01/08/2022   Procedure: EXPLORATORY LAPAROTOMY, SMALL BOWEL RESECTION;  Surgeon: Ebbie Cough, MD;  Location: [facility];  Service: General;  Laterality: N/A;   VAGINAL HYSTERECTOMY      Medication: Current Facility-Administered Medications  Medication Dose Route Frequency Provider Last Rate Last Admin   acetaminophen  (TYLENOL ) tablet 650 mg  650 mg Oral Q6H PRN Lou Claretta HERO, MD       Or   acetaminophen  (TYLENOL ) suppository 650 mg  650 mg Rectal Q6H PRN Lou Claretta HERO, MD       enoxaparin  (LOVENOX ) injection 40 mg  40 mg Subcutaneous Q24H Lou Claretta HERO, MD   40 mg at 08/04/23 2121   ondansetron  (ZOFRAN ) tablet 4 mg  4 mg Oral Q6H PRN Lou Claretta HERO, MD       Or   ondansetron  (ZOFRAN ) injection 4 mg  4  mg Intravenous Q6H PRN Amponsah, Prosper M, MD       potassium chloride  SA (KLOR-CON  M) CR tablet 40 mEq  40 mEq Oral Q4H Pahwani, Ravi, MD   40 mEq at 08/05/23 0824   senna-docusate (Senokot-S) tablet 1 tablet  1 tablet Oral QHS PRN Lou Claretta HERO, MD       tamoxifen  (NOLVADEX ) tablet 20 mg  20 mg Oral Daily Amponsah, Prosper M, MD   20 mg at 08/05/23 9176   vancomycin  (VANCOCIN ) capsule 125 mg  125 mg Oral QID Pahwani, Ravi, MD   125 mg at 08/05/23 9176   Followed by   NOREEN ON 08/19/2023] vancomycin  (VANCOCIN ) capsule 125 mg  125 mg Oral  BID Vernon Ranks, MD       Followed by   NOREEN ON 08/26/2023] vancomycin  (VANCOCIN ) capsule 125 mg  125 mg Oral Daily Pahwani, Ranks, MD       Followed by   NOREEN ON 09/02/2023] vancomycin  (VANCOCIN ) capsule 125 mg  125 mg Oral QODAY Pahwani, Ravi, MD       Followed by   NOREEN ON 09/30/2023] vancomycin  (VANCOCIN ) capsule 125 mg  125 mg Oral Q3 days Vernon Ranks, MD        Allergies: Allergies  Allergen Reactions   Doxycycline  Nausea And Vomiting   Codeine Other (See Comments)    GI upset   Hydrocodone  Nausea Only    Social History: Social History   Tobacco Use   Smoking status: Never   Smokeless tobacco: Never  Vaping Use   Vaping status: Never Used  Substance Use Topics   Alcohol use: No   Drug use: No    Family History Family History  Problem Relation Age of Onset   Diabetes Mother    Colon cancer Mother    Stroke Father    Cancer Sister    Cancer Brother    Heart attack Brother    Cancer Brother    Heart attack Brother     ROS   Objective   Vital signs in last 24 hours: BP (!) 121/58   Pulse 65   Temp (!) 97.4 F (36.3 C)   Resp 18   Ht 5' 3 (1.6 m)   Wt 56.7 kg   SpO2 99%   BMI 22.14 kg/m   Physical Exam General: NAD, A&O, resting, appropriate HEENT: [place]/AT Pulmonary: Normal work of breathing Cardiovascular: no cyanosis Abdomen: Soft, NTTP, nondistended Neuro: Appropriate, no focal neurological deficits  Most Recent Labs: Lab Results  Component Value Date   WBC 4.0 08/05/2023   HGB 9.4 (L) 08/05/2023   HCT 28.9 (L) 08/05/2023   PLT 135 (L) 08/05/2023    Lab Results  Component Value Date   NA 140 08/05/2023   K 3.4 (L) 08/05/2023   CL 111 08/05/2023   CO2 23 08/05/2023   BUN 12 08/05/2023   CREATININE 0.83 08/05/2023   CALCIUM 8.0 (L) 08/05/2023   MG 1.8 05/21/2023   PHOS 1.7 (L) 04/27/2023    No results found for: INR, APTT   Urine Culture: @[id number] (laburin,org,[id number])@   IMAGING: CT ABDOMEN PELVIS W  CONTRAST Result Date: 08/04/2023 CLINICAL DATA:  Abdominal pain. Acute nonlocalized. LEFT lower quadrant pain. Pain with diarrhea for 3 4 days. EXAM: CT ABDOMEN AND PELVIS WITH CONTRAST TECHNIQUE: Multidetector CT imaging of the abdomen and pelvis was performed using the standard protocol following bolus administration of intravenous contrast. RADIATION DOSE REDUCTION: This exam was performed according to the departmental dose-optimization program which  includes automated exposure control, adjustment of the mA and/or kV according to patient size and/or use of iterative reconstruction technique. CONTRAST:  75mL OMNIPAQUE  IOHEXOL  300 MG/ML  SOLN COMPARISON:  CT 05/21/2023 FINDINGS: Lower chest: Lung bases are clear. Hepatobiliary: No focal hepatic lesion. Postcholecystectomy. No biliary dilatation. Pancreas: Pancreas is normal. No ductal dilatation. No pancreatic inflammation. Spleen: Normal spleen Adrenals/urinary tract: Adrenal glands are normal. There is high-density fluid/material within the mildly dilated RIGHT renal pelvis and ureter. The RIGHT renal pelvis with HU equal 74 compared to the HU equal 8.5 on the LEFT. Delayed imaging demonstrates no clear filling defect within the dilated proximal RIGHT collecting system. Bladder normal. Stomach/Bowel: Large hiatal hernia. Majority of stomach above the diaphragm. The duodenum and small bowel are normal. Small bowel anastomosis in the LEFT upper abdomen. Appendix not identified. There is submucosal edema and serosal inflammation involving the ascending colon and. Mild submucosal edema involving the transverse and descending colon lesser degree. There multiple diverticula of the descending colon. There is submucosal edema involving the sigmoid colon and rectum. No perforation or abscess.  No pneumatosis.  No portal venous gas. Vascular/Lymphatic: Abdominal aorta is normal caliber with atherosclerotic calcification. There is no retroperitoneal or periportal  lymphadenopathy. No pelvic lymphadenopathy. Reproductive: Post hysterectomy.  Adnexa unremarkable Other: No free fluid. Musculoskeletal: No aggressive osseous lesion. IMPRESSION: 1. Submucosal edema and inflammation involving the entire colon to the rectum. Findings consistent with pancolitis. Differential would include infectious colitis versus inflammatory bowel disease. 2. Extensive diverticulosis of the LEFT colon without clear acute diverticulitis. 3. High-density material within the RIGHT renal pelvis and dilated ureter. Findings concerning for blood within the ureter. Recommend clinical correlation. No source of hematuria identified within the RIGHT kidney. Consider nonemergent urology consultation. Electronically Signed   By: Jackquline Boxer M.D.   On: 08/04/2023 16:08    ------  Ole Bourdon, NP Pager: [phone]   Please contact the urology consult pager with any further questions/concerns.

## 2023-08-05 NOTE — Progress Notes (Signed)
 PROGRESS NOTE    Jasmine Cantu  FMW:[id number] DOB: November 16, 1940 DOA: 08/04/2023 PCP: Teresa Channel, MD   Brief Narrative:  HPI: Jasmine Cantu is a 83 y.o. female with medical history significant of C. difficile infection, HTN, stage IA right breast cancer s/p lumpectomy on tamoxifen , bowel ischemia with perforation s/p ex lap and small bowel resection June 2023, GERD, vitamin B12 deficiency, and IDA who presented to the [facility] ED for evaluation of diarrhea. She reports she had C. difficile infection 2 times in October last year and was treated with vancomycin . She was recently diagnosed with UTI 3 weeks ago and completed 7-day course of Ceftin 2 weeks ago. Yesterday morning, she had one loose stool but none throughout the day. Last night, she started having watery stools described as greenish, slimy and liquidy. She reports waking up 4-5 times to use the bathroom. She reports mild abdominal discomfort chronic urinary frequency but denies any nausea, vomiting, dysuria, bloody stools, dizziness, shortness of breath, chest pain, fevers or chills.   [facility] ED course: Initial vitals with temp 98.2, RR 18, HR 70, BP 135/53, SpO2 98% on room air Labs show sodium 134, K+ 3.5, bicarb 20, creatinine 0.86, albumin 3.2, WBC 7.3, Hgb 10.6, platelet 162, UA shows mild ketonuria but no signs of infection CT A/P shows submucosal edema and inflammation involving the entire colon to the rectum consistent with pancolitis as well as high density material within the right renal pelvis and dilated ureter concerning for blood within the ureter.  Infectious disease was consulted and they recommended C. difficile testing prior to treatment Patient received IV NS 1 L bolus Patient was admitted to [facility] service and transferred to [facility]  Assessment & Plan:   Principal Problem:   Pancolitis [facility]) Active Problems:   Diarrhea   Right ureter dilated  # Pancolitis secondary to C. difficile  infection: Has previous history of C. difficile infection x 2 in October 2024 treated with oral vancomycin .  Based on the order set, will treat with prolonged antibiotics of 12-week tapering course with oral vancomycin .  Diarrhea has improved somewhat.   # Dilated R ureter CT A/P shows evidence of high density material within the right renal pelvis and dilated ureter concerning for blood within the ureter. Patient reports history of urinary frequency but denies any dysuria or hematuria. UA with no signs of hematuria or acute infection.  Urology consulted, pending evaluation.   # HTN BP stable.  Per her daughter, patient was taken off her BP meds due to normotensive BP off of her medications.  Monitor for now.   # Breast cancer Stage IA right breast cancer diagnosed last year now status post lumpectomy and on tamoxifen  since August 2024. -Continue tamoxifen  -Follow-up with oncology in the outpatient   # Vitamin B12 deficiency # Iron deficiency anemia Hemoglobin of 10.6 around her baseline of 10. -Continue outpatient B12 injections  Hypokalemia: Will replenish.    DVT prophylaxis: enoxaparin  (LOVENOX ) injection 40 mg Start: 08/04/23 2200   Code Status: Full Code  Family Communication: 2 daughters present at bedside.  Plan of care discussed with patient in length and he/she verbalized understanding and agreed with it.  Status is: Observation The patient will require care spanning > 2 midnights and should be moved to inpatient because: Still having diarrhea, may need to be in the hospital for 1-2 more days at least.   Estimated body mass index is 22.14 kg/m as calculated from the following:   Height  as of this encounter: 5' 3 (1.6 m).   Weight as of this encounter: 56.7 kg.    Nutritional Assessment: Body mass index is 22.14 kg/m.SABRA Seen by dietician.  I agree with the assessment and plan as outlined below: Nutrition Status:        . Skin Assessment: I have examined the  patient's skin and I agree with the wound assessment as performed by the wound care RN as outlined below:    Consultants:  Urology  Procedures:  None  Antimicrobials:  Anti-infectives (From admission, onward)    Start     Dose/Rate Route Frequency Ordered Stop   09/30/23 1000  vancomycin  (VANCOCIN ) capsule 125 mg       Placed in Followed by Linked Group   125 mg Oral Every 3 DAYS 08/05/23 0737 10/30/23 0959   09/02/23 1000  vancomycin  (VANCOCIN ) capsule 125 mg       Placed in Followed by Linked Group   125 mg Oral Every other day 08/05/23 0737 09/30/23 0959   08/26/23 1000  vancomycin  (VANCOCIN ) capsule 125 mg       Placed in Followed by Linked Group   125 mg Oral Daily 08/05/23 0737 09/02/23 0959   08/19/23 1000  vancomycin  (VANCOCIN ) capsule 125 mg       Placed in Followed by Linked Group   125 mg Oral 2 times daily 08/05/23 0737 08/26/23 0959   08/05/23 1000  vancomycin  (VANCOCIN ) capsule 125 mg       Placed in Followed by Linked Group   125 mg Oral 4 times daily 08/05/23 0737 08/19/23 0959         Subjective: Patient seen and examined.  She has been having profuse diarrhea, she has had 3 bowel movements in the last 2 hours.  Daughters are at the bedside.  Patient has no abdominal pain.  Lengthy discussion with the patient about 12-week prolonged tapering dose of vancomycin .  Daughters were questioning if tamoxifen  should be held.  Typically, tamoxifen  is not supposed to be held.  I tried reaching patient's oncologist Dr. Loretha per their request but I was unable to reach.  Objective: Vitals:   08/04/23 1911 08/04/23 2321 08/05/23 0033 08/05/23 0351  BP: (!) 129/46 (!) 134/48 (!) 141/52 (!) 116/54  Pulse: 66 (!) 58 (!) 58 67  Resp:  17 19 17   Temp: (!) 97.4 F (36.3 C) 97.8 F (36.6 C)  97.8 F (36.6 C)  TempSrc:      SpO2: 99% 99% 100% 98%  Weight:      Height:        Intake/Output Summary (Last 24 hours) at 08/05/2023 0738 Last data filed at 08/04/2023  1823 Gross per 24 hour  Intake 1003.59 ml  Output --  Net 1003.59 ml   Filed Weights   08/04/23 1157  Weight: 56.7 kg    Examination:  General exam: Appears calm and comfortable  Respiratory system: Clear to auscultation. Respiratory effort normal. Cardiovascular system: S1 & S2 heard, RRR. No JVD, murmurs, rubs, gallops or clicks. No pedal edema. Gastrointestinal system: Abdomen is nondistended, soft and bilateral lower quadrant abdominal tenderness. No organomegaly or masses felt. Normal bowel sounds heard. Central nervous system: Alert and oriented. No focal neurological deficits. Extremities: Symmetric 5 x 5 power. Skin: No rashes, lesions or ulcers Psychiatry: Judgement and insight appear normal. Mood & affect appropriate.    Data Reviewed: I have personally reviewed following labs and imaging studies  CBC: Recent Labs  Lab 08/04/23  1434 08/05/23 0430  WBC 7.3 4.0  NEUTROABS 6.5  --   HGB 10.6* 9.4*  HCT 31.6* 28.9*  MCV 86.1 90.0  PLT 162 135*   Basic Metabolic Panel: Recent Labs  Lab 08/04/23 1200 08/05/23 0430  NA 134* 140  K 3.5 3.4*  CL 104 111  CO2 20* 23  GLUCOSE 138* 92  BUN 15 12  CREATININE 0.86 0.83  CALCIUM 8.2* 8.0*   GFR: Estimated Creatinine Clearance: 41.7 mL/min (by C-G formula based on SCr of 0.83 mg/dL). Liver Function Tests: Recent Labs  Lab 08/04/23 1200  AST 41  ALT 29  ALKPHOS 51  BILITOT 0.7  PROT 6.5  ALBUMIN 3.2*   No results for input(s): LIPASE, AMYLASE in the last 168 hours. No results for input(s): AMMONIA in the last 168 hours. Coagulation Profile: No results for input(s): INR, PROTIME in the last 168 hours. Cardiac Enzymes: No results for input(s): CKTOTAL, CKMB, CKMBINDEX, TROPONINI in the last 168 hours. BNP (last 3 results) No results for input(s): PROBNP in the last 8760 hours. HbA1C: No results for input(s): HGBA1C in the last 72 hours. CBG: No results for input(s): GLUCAP in  the last 168 hours. Lipid Profile: No results for input(s): CHOL, HDL, LDLCALC, TRIG, CHOLHDL, LDLDIRECT in the last 72 hours. Thyroid Function Tests: No results for input(s): TSH, T4TOTAL, FREET4, T3FREE, THYROIDAB in the last 72 hours. Anemia Panel: No results for input(s): VITAMINB12, FOLATE, FERRITIN, TIBC, IRON, RETICCTPCT in the last 72 hours. Sepsis Labs: No results for input(s): PROCALCITON, LATICACIDVEN in the last 168 hours.  Recent Results (from the past 240 hours)  C Difficile Quick Screen w PCR reflex     Status: Abnormal   Collection Time: 08/04/23  9:44 PM   Specimen: STOOL  Result Value Ref Range Status   C Diff antigen POSITIVE (A) NEGATIVE Final   C Diff toxin POSITIVE (A) NEGATIVE Final   C Diff interpretation Toxin producing C. difficile detected.  Final    Comment: RESULT CALLED TO, READ BACK BY AND VERIFIED WITH: ANANCI,H AT 2310 ON 08/04/23 BY LUZOLOP Performed at [facility], [address], [place], [place] [address]      Radiology Studies: CT ABDOMEN PELVIS W CONTRAST Result Date: 08/04/2023 CLINICAL DATA:  Abdominal pain. Acute nonlocalized. LEFT lower quadrant pain. Pain with diarrhea for 3 4 days. EXAM: CT ABDOMEN AND PELVIS WITH CONTRAST TECHNIQUE: Multidetector CT imaging of the abdomen and pelvis was performed using the standard protocol following bolus administration of intravenous contrast. RADIATION DOSE REDUCTION: This exam was performed according to the departmental dose-optimization program which includes automated exposure control, adjustment of the mA and/or kV according to patient size and/or use of iterative reconstruction technique. CONTRAST:  75mL OMNIPAQUE  IOHEXOL  300 MG/ML  SOLN COMPARISON:  CT 05/21/2023 FINDINGS: Lower chest: Lung bases are clear. Hepatobiliary: No focal hepatic lesion. Postcholecystectomy. No biliary dilatation. Pancreas: Pancreas is normal. No ductal dilatation. No  pancreatic inflammation. Spleen: Normal spleen Adrenals/urinary tract: Adrenal glands are normal. There is high-density fluid/material within the mildly dilated RIGHT renal pelvis and ureter. The RIGHT renal pelvis with HU equal 74 compared to the HU equal 8.5 on the LEFT. Delayed imaging demonstrates no clear filling defect within the dilated proximal RIGHT collecting system. Bladder normal. Stomach/Bowel: Large hiatal hernia. Majority of stomach above the diaphragm. The duodenum and small bowel are normal. Small bowel anastomosis in the LEFT upper abdomen. Appendix not identified. There is submucosal edema and serosal inflammation involving the  ascending colon and. Mild submucosal edema involving the transverse and descending colon lesser degree. There multiple diverticula of the descending colon. There is submucosal edema involving the sigmoid colon and rectum. No perforation or abscess.  No pneumatosis.  No portal venous gas. Vascular/Lymphatic: Abdominal aorta is normal caliber with atherosclerotic calcification. There is no retroperitoneal or periportal lymphadenopathy. No pelvic lymphadenopathy. Reproductive: Post hysterectomy.  Adnexa unremarkable Other: No free fluid. Musculoskeletal: No aggressive osseous lesion. IMPRESSION: 1. Submucosal edema and inflammation involving the entire colon to the rectum. Findings consistent with pancolitis. Differential would include infectious colitis versus inflammatory bowel disease. 2. Extensive diverticulosis of the LEFT colon without clear acute diverticulitis. 3. High-density material within the RIGHT renal pelvis and dilated ureter. Findings concerning for blood within the ureter. Recommend clinical correlation. No source of hematuria identified within the RIGHT kidney. Consider nonemergent urology consultation. Electronically Signed   By: Jackquline Boxer M.D.   On: 08/04/2023 16:08    Scheduled Meds:  enoxaparin  (LOVENOX ) injection  40 mg Subcutaneous Q24H    potassium chloride   40 mEq Oral Q4H   tamoxifen   20 mg Oral Daily   vancomycin   125 mg Oral QID   Followed by   NOREEN ON 08/19/2023] vancomycin   125 mg Oral BID   Followed by   NOREEN ON 08/26/2023] vancomycin   125 mg Oral Daily   Followed by   NOREEN ON 09/02/2023] vancomycin   125 mg Oral QODAY   Followed by   NOREEN ON 09/30/2023] vancomycin   125 mg Oral Q3 days   Continuous Infusions:   LOS: 0 days   Fredia Skeeter, MD [facility]  08/05/2023, 7:38 AM   *Please note that this is a verbal dictation therefore any spelling or grammatical errors are due to the Dragon Medical One system interpretation.  Please page via Amion and do not message via secure chat for urgent patient care matters. Secure chat can be used for non urgent patient care matters.  How to contact the [facility] Attending or Consulting provider 7A - 7P or covering provider during after hours 7P -7A, for this patient?  Check the care team in [facility] and look for a) attending/consulting [facility] provider listed and b) the [facility] team listed. Page or secure chat 7A-7P. Log into [email or web address] and use [place]'s universal password to access. If you do not have the password, please contact the hospital operator. Locate the [facility] provider you are looking for under [facility] and page to a number that you can be directly reached. If you still have difficulty reaching the provider, please page the [facility] (Director on Call) for the Hospitalists listed on amion for assistance.

## 2023-08-06 ENCOUNTER — Telehealth (HOSPITAL_COMMUNITY): Payer: Self-pay | Admitting: Pharmacy Technician

## 2023-08-06 ENCOUNTER — Other Ambulatory Visit (HOSPITAL_COMMUNITY): Payer: Self-pay

## 2023-08-06 DIAGNOSIS — K5100 Ulcerative (chronic) pancolitis without complications: Secondary | ICD-10-CM | POA: Diagnosis not present

## 2023-08-06 LAB — CBC WITH DIFFERENTIAL/PLATELET
Abs Immature Granulocytes: 0.01 10*3/uL (ref 0.00–0.07)
Basophils Absolute: 0 10*3/uL (ref 0.0–0.1)
Basophils Relative: 1 %
Eosinophils Absolute: 0.4 10*3/uL (ref 0.0–0.5)
Eosinophils Relative: 10 %
HCT: 26.9 % — ABNORMAL LOW (ref 36.0–46.0)
Hemoglobin: 9 g/dL — ABNORMAL LOW (ref 12.0–15.0)
Immature Granulocytes: 0 %
Lymphocytes Relative: 27 %
Lymphs Abs: 1.1 10*3/uL (ref 0.7–4.0)
MCH: 29.2 pg (ref 26.0–34.0)
MCHC: 33.5 g/dL (ref 30.0–36.0)
MCV: 87.3 fL (ref 80.0–100.0)
Monocytes Absolute: 0.3 10*3/uL (ref 0.1–1.0)
Monocytes Relative: 7 %
Neutro Abs: 2.2 10*3/uL (ref 1.7–7.7)
Neutrophils Relative %: 55 %
Platelets: 152 10*3/uL (ref 150–400)
RBC: 3.08 MIL/uL — ABNORMAL LOW (ref 3.87–5.11)
RDW: 13.6 % (ref 11.5–15.5)
WBC: 3.9 10*3/uL — ABNORMAL LOW (ref 4.0–10.5)
nRBC: 0 % (ref 0.0–0.2)

## 2023-08-06 LAB — BASIC METABOLIC PANEL
Anion gap: 3 — ABNORMAL LOW (ref 5–15)
BUN: 10 mg/dL (ref 8–23)
CO2: 22 mmol/L (ref 22–32)
Calcium: 7.7 mg/dL — ABNORMAL LOW (ref 8.9–10.3)
Chloride: 110 mmol/L (ref 98–111)
Creatinine, Ser: 0.83 mg/dL (ref 0.44–1.00)
GFR, Estimated: >60 mL/min (ref >60)
Glucose, Bld: 94 mg/dL (ref 70–99)
Potassium: 3.2 mmol/L — ABNORMAL LOW (ref 3.5–5.1)
Sodium: 135 mmol/L (ref 135–145)

## 2023-08-06 LAB — GASTROINTESTINAL PANEL BY PCR, STOOL (REPLACES STOOL CULTURE)

## 2023-08-06 LAB — MAGNESIUM: Magnesium: 1.7 mg/dL (ref 1.7–2.4)

## 2023-08-06 MED ORDER — SODIUM CHLORIDE 0.9 % IV SOLN: INTRAVENOUS | Status: AC

## 2023-08-06 MED ORDER — FIDAXOMICIN 200 MG PO TABS
200.0000 mg | ORAL_TABLET | Freq: Two times a day (BID) | ORAL | Status: DC
  Administered 2023-08-06 – 2023-08-08 (×5): 200 mg via ORAL
  Filled 2023-08-06 (×5): qty 1

## 2023-08-06 MED ORDER — DOXYCYCLINE HYCLATE 100 MG PO TABS
100.0000 mg | ORAL_TABLET | Freq: Two times a day (BID) | ORAL | Status: DC
  Filled 2023-08-06: qty 1

## 2023-08-06 MED ORDER — POTASSIUM CHLORIDE CRYS ER 20 MEQ PO TBCR
40.0000 meq | EXTENDED_RELEASE_TABLET | Freq: Once | ORAL | Status: AC
  Administered 2023-08-06: 40 meq via ORAL
  Filled 2023-08-06: qty 2

## 2023-08-06 MED ORDER — MAGNESIUM OXIDE -MG SUPPLEMENT 400 (240 MG) MG PO TABS
800.0000 mg | ORAL_TABLET | Freq: Once | ORAL | Status: AC
  Administered 2023-08-06: 800 mg via ORAL
  Filled 2023-08-06: qty 2

## 2023-08-06 MED ORDER — CARMEX CLASSIC LIP BALM EX OINT
TOPICAL_OINTMENT | CUTANEOUS | Status: DC | PRN
  Filled 2023-08-06: qty 10

## 2023-08-06 MED ORDER — DOXYCYCLINE HYCLATE 100 MG IV SOLR
100.0000 mg | Freq: Two times a day (BID) | INTRAVENOUS | Status: DC
  Administered 2023-08-06 – 2023-08-08 (×4): 100 mg via INTRAVENOUS
  Filled 2023-08-06 (×4): qty 100

## 2023-08-06 NOTE — Telephone Encounter (Signed)
 Patient Product/process Development Scientist completed.    The patient is insured through [facility]. Patient has Medicare and is not eligible for a copay card, but may be able to apply for patient assistance or Medicare RX Payment Plan (Patient Must reach out to their plan, if eligible for payment plan), if available.    Ran test claim for Dificid  200 mg and the current 10 day co-pay is $1,724.42.  Ran test claim for Zinplava  1000 mg/40 ml and the current 1 day co-pay is $1,716.84.  This test claim was processed through [facility]- copay amounts may vary at other pharmacies due to pharmacy/plan contracts, or as the patient moves through the different stages of their insurance plan.     Reyes Sharps, CPHT Pharmacy Technician III Certified Patient Advocate [facility] Pharmacy Patient Advocate Team Direct Number: [phone]  Fax: [phone]

## 2023-08-06 NOTE — Telephone Encounter (Signed)
 Patient Advocate Encounter  Patient is approved through the Clostridium Difficile-Associated Diarrhea Copay Assistance Program for Dificid  through [employer].      Reyes Sharps, CPhT Pharmacy Patient Advocate Specialist [facility] Patient Advocate Team Direct Number: [phone]  Fax: [phone]

## 2023-08-06 NOTE — Hospital Course (Addendum)
 Brief Narrative:  83 year old with history of C. difficile, HTN, right breast cancer status postlumpectomy on tamoxifen , bowel ischemia with perforation status post ex lap and small bowel resection in June 2023, GERD, vitamin B12 deficiency, iron deficiency anemia comes to the hospital with complaints of diarrhea.  Patient has previously been treated twice for C. difficile infection in the last year with vancomycin .  Developed UTI about 3 weeks ago and completed 7-day course of Ceftin.  Now having diarrhea again.  Upon admission noted to have pancolitis and tested positive for C. difficile.  Eventually also tested positive for Yersinia.  ID was consulted.  Antibiotics were switched to Dificid  and doxycycline . Doing well no complaints, wishing to go home today.  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 vancomycin  now developed C. difficile again post UTI treatment with p.o. antibiotics.   -IV Doxy for 5 days, change to oral solution upon dc.  EOT 1/18 Appreciate ID input, current recommendations: -Dificid  for 10 days.  EOT 08/14/2022.  In the future anytime patient goes on p.o. antibiotics for any reason, should be on vancomycin  twice daily p.o. prophylaxis and extended for 7 days after cessation of antibiotics.   Dilated R ureter On CT abdomen pelvis shows high density material in the right renal pelvis and dilated ureter.  Urology recommending outpatient follow-up once the acute illness is over.   Essential hypertension Apparently was taken off BP medication    Breast cancer Stage IA right breast cancer diagnosed last year now status post lumpectomy and on tamoxifen  since August 2024. -Continue tamoxifen  -Follow-up with oncology in the outpatient   Vitamin B12 and iron deficiency  anemia Hemoglobin of 10.6 around her baseline of 10. -Continue outpatient B12 injections   Hypokalemia:  As needed repletion     DVT prophylaxis: enoxaparin  (LOVENOX ) injection 40 mg Start: 08/04/23 2200   Code Status: Full Code  Family Communication: Daughter present at bedside. Today in stable condition     Subjective: Feeling well sitting in the recliner.  No complaints  Examination:  General exam: Appears calm and comfortable  Respiratory system: Clear to auscultation. Respiratory effort normal. Cardiovascular system: S1 & S2 heard, RRR. No JVD, murmurs, rubs, gallops or clicks. No pedal edema. Gastrointestinal system: Abdomen is nondistended, soft and nontender. No organomegaly or masses felt. Normal bowel sounds heard. Central nervous system: Alert and oriented. No focal neurological deficits. Extremities: Symmetric 5 x 5 power. Skin: No rashes, lesions or ulcers Psychiatry: Judgement and insight appear normal. Mood & affect appropriate.

## 2023-08-06 NOTE — Progress Notes (Signed)
 PROGRESS NOTE    Jasmine Cantu  FMW:[id number] DOB: October 30, 1940 DOA: 08/04/2023 PCP: Teresa Channel, MD    Brief Narrative:  83 year old with history of C. difficile, HTN, right breast cancer status postlumpectomy on tamoxifen , bowel ischemia with perforation status post ex lap and small bowel resection in June 2023, GERD, vitamin B12 deficiency, iron deficiency anemia comes to the hospital with complaints of diarrhea.  Patient has previously been treated twice for C. difficile infection in the last year with vancomycin .  Developed UTI about 3 weeks ago and completed 7-day course of Ceftin.  Now having diarrhea again.  Upon admission noted to have pancolitis and tested positive for C. difficile.   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 vancomycin  now developed C. difficile again post UTI treatment with p.o. antibiotics.  Previous provider discussed case with ID who is recommending prolonged taper course of p.o. vancomycin .   Dilated R ureter On CT abdomen pelvis shows high density material in the right renal pelvis and dilated ureter.  Urology recommending outpatient follow-up once the acute illness is over.   Essential hypertension Apparently was taken off BP medication IV as needed   Breast cancer Stage IA right breast cancer diagnosed last year now status post lumpectomy and on tamoxifen  since August 2024. -Continue tamoxifen  -Follow-up with oncology in the outpatient   Vitamin B12 and iron deficiency anemia Hemoglobin of 10.6 around her baseline of 10. -Continue outpatient B12 injections   Hypokalemia:  As needed repletion     DVT prophylaxis: enoxaparin  (LOVENOX ) injection 40 mg Start: 08/04/23 2200   Code Status: Full Code  Family Communication: 2 daughters present at bedside.  Continue  hospital stay for management of C. difficile     Subjective:  Seen and examined at bedside, had a few episodes of diarrhea yesterday.  Tells me she has not had a bowel movement yet today. Very mild lower abdominal pain.  Examination:  General exam: Appears calm and comfortable  Respiratory system: Clear to auscultation. Respiratory effort normal. Cardiovascular system: S1 & S2 heard, RRR. No JVD, murmurs, rubs, gallops or clicks. No pedal edema. Gastrointestinal system: Abdomen is nondistended, soft and nontender. No organomegaly or masses felt. Normal bowel sounds heard. Central nervous system: Alert and oriented. No focal neurological deficits. Extremities: Symmetric 5 x 5 power. Skin: No rashes, lesions or ulcers Psychiatry: Judgement and insight appear normal. Mood & affect appropriate.                Diet Orders (From admission, onward)     Start     Ordered   08/04/23 2002  Diet regular Room service appropriate? Yes; Fluid consistency: Thin  Diet effective now       Question Answer Comment  Room service appropriate? Yes   Fluid consistency: Thin      08/04/23 2001            Objective: Vitals:   08/05/23 0800 08/05/23 1220 08/05/23 2021 08/06/23 0328  BP: (!) 121/58 (!) 127/59 (!) 131/54 (!) 151/62  Pulse: 65 64 61 67  Resp: 18 18 17 19   Temp: (!) 97.4 F (36.3 C) 97.8 F (36.6 C) 98.2 F (36.8 C) 97.6 F (36.4 C)  TempSrc:  Oral    SpO2: 99% 100% 98% 100%  Weight:  Height:       No intake or output data in the 24 hours ending 08/06/23 1112 Filed Weights   08/04/23 1157  Weight: 56.7 kg    Scheduled Meds:  enoxaparin  (LOVENOX ) injection  40 mg Subcutaneous Q24H   tamoxifen   20 mg Oral Daily   vancomycin   125 mg Oral QID   Followed by   NOREEN ON 08/19/2023] vancomycin   125 mg Oral BID   Followed by   NOREEN ON 08/26/2023] vancomycin   125 mg Oral Daily   Followed by   NOREEN ON 09/02/2023] vancomycin   125 mg Oral QODAY   Followed by    NOREEN ON 09/30/2023] vancomycin   125 mg Oral Q3 days   Continuous Infusions:  Nutritional status     Body mass index is 22.14 kg/m.  Data Reviewed:   CBC: Recent Labs  Lab 08/04/23 1434 08/05/23 0430 08/06/23 0426  WBC 7.3 4.0 3.9*  NEUTROABS 6.5  --  2.2  HGB 10.6* 9.4* 9.0*  HCT 31.6* 28.9* 26.9*  MCV 86.1 90.0 87.3  PLT 162 135* 152   Basic Metabolic Panel: Recent Labs  Lab 08/04/23 1200 08/05/23 0430 08/06/23 0426  NA 134* 140 135  K 3.5 3.4* 3.2*  CL 104 111 110  CO2 20* 23 22  GLUCOSE 138* 92 94  BUN 15 12 10   CREATININE 0.86 0.83 0.83  CALCIUM 8.2* 8.0* 7.7*  MG  --   --  1.7   GFR: Estimated Creatinine Clearance: 41.7 mL/min (by C-G formula based on SCr of 0.83 mg/dL). Liver Function Tests: Recent Labs  Lab 08/04/23 1200  AST 41  ALT 29  ALKPHOS 51  BILITOT 0.7  PROT 6.5  ALBUMIN 3.2*   No results for input(s): LIPASE, AMYLASE in the last 168 hours. No results for input(s): AMMONIA in the last 168 hours. Coagulation Profile: No results for input(s): INR, PROTIME in the last 168 hours. Cardiac Enzymes: No results for input(s): CKTOTAL, CKMB, CKMBINDEX, TROPONINI in the last 168 hours. BNP (last 3 results) No results for input(s): PROBNP in the last 8760 hours. HbA1C: No results for input(s): HGBA1C in the last 72 hours. CBG: No results for input(s): GLUCAP in the last 168 hours. Lipid Profile: No results for input(s): CHOL, HDL, LDLCALC, TRIG, CHOLHDL, LDLDIRECT in the last 72 hours. Thyroid Function Tests: No results for input(s): TSH, T4TOTAL, FREET4, T3FREE, THYROIDAB in the last 72 hours. Anemia Panel: No results for input(s): VITAMINB12, FOLATE, FERRITIN, TIBC, IRON, RETICCTPCT in the last 72 hours. Sepsis Labs: No results for input(s): PROCALCITON, LATICACIDVEN in the last 168 hours.  Recent Results (from the past 240 hours)  C Difficile Quick Screen w PCR reflex      Status: Abnormal   Collection Time: 08/04/23  9:44 PM   Specimen: STOOL  Result Value Ref Range Status   C Diff antigen POSITIVE (A) NEGATIVE Final   C Diff toxin POSITIVE (A) NEGATIVE Final   C Diff interpretation Toxin producing C. difficile detected.  Final    Comment: RESULT CALLED TO, READ BACK BY AND VERIFIED WITH: ANANCI,H AT 2310 ON 08/04/23 BY LUZOLOP Performed at [facility], [address] [place], [place] [address]          Radiology Studies: CT ABDOMEN PELVIS W CONTRAST Result Date: 08/04/2023 CLINICAL DATA:  Abdominal pain. Acute nonlocalized. LEFT lower quadrant pain. Pain with diarrhea for 3 4 days. EXAM: CT ABDOMEN AND PELVIS WITH CONTRAST TECHNIQUE: Multidetector CT imaging of the abdomen  and pelvis was performed using the standard protocol following bolus administration of intravenous contrast. RADIATION DOSE REDUCTION: This exam was performed according to the departmental dose-optimization program which includes automated exposure control, adjustment of the mA and/or kV according to patient size and/or use of iterative reconstruction technique. CONTRAST:  75mL OMNIPAQUE  IOHEXOL  300 MG/ML  SOLN COMPARISON:  CT 05/21/2023 FINDINGS: Lower chest: Lung bases are clear. Hepatobiliary: No focal hepatic lesion. Postcholecystectomy. No biliary dilatation. Pancreas: Pancreas is normal. No ductal dilatation. No pancreatic inflammation. Spleen: Normal spleen Adrenals/urinary tract: Adrenal glands are normal. There is high-density fluid/material within the mildly dilated RIGHT renal pelvis and ureter. The RIGHT renal pelvis with HU equal 74 compared to the HU equal 8.5 on the LEFT. Delayed imaging demonstrates no clear filling defect within the dilated proximal RIGHT collecting system. Bladder normal. Stomach/Bowel: Large hiatal hernia. Majority of stomach above the diaphragm. The duodenum and small bowel are normal. Small bowel anastomosis in the LEFT upper abdomen.  Appendix not identified. There is submucosal edema and serosal inflammation involving the ascending colon and. Mild submucosal edema involving the transverse and descending colon lesser degree. There multiple diverticula of the descending colon. There is submucosal edema involving the sigmoid colon and rectum. No perforation or abscess.  No pneumatosis.  No portal venous gas. Vascular/Lymphatic: Abdominal aorta is normal caliber with atherosclerotic calcification. There is no retroperitoneal or periportal lymphadenopathy. No pelvic lymphadenopathy. Reproductive: Post hysterectomy.  Adnexa unremarkable Other: No free fluid. Musculoskeletal: No aggressive osseous lesion. IMPRESSION: 1. Submucosal edema and inflammation involving the entire colon to the rectum. Findings consistent with pancolitis. Differential would include infectious colitis versus inflammatory bowel disease. 2. Extensive diverticulosis of the LEFT colon without clear acute diverticulitis. 3. High-density material within the RIGHT renal pelvis and dilated ureter. Findings concerning for blood within the ureter. Recommend clinical correlation. No source of hematuria identified within the RIGHT kidney. Consider nonemergent urology consultation. Electronically Signed   By: Jackquline Boxer M.D.   On: 08/04/2023 16:08           LOS: 1 day   Time spent= 35 mins    Burgess JAYSON Dare, MD [facility]  If 7PM-7AM, please contact night-coverage  08/06/2023, 11:12 AM

## 2023-08-06 NOTE — Plan of Care (Signed)

## 2023-08-07 ENCOUNTER — Other Ambulatory Visit (HOSPITAL_COMMUNITY): Payer: Self-pay

## 2023-08-07 ENCOUNTER — Telehealth (HOSPITAL_COMMUNITY): Payer: Self-pay | Admitting: Pharmacy Technician

## 2023-08-07 DIAGNOSIS — K5100 Ulcerative (chronic) pancolitis without complications: Secondary | ICD-10-CM | POA: Diagnosis not present

## 2023-08-07 LAB — CBC
HCT: 27.1 % — ABNORMAL LOW (ref 36.0–46.0)
Hemoglobin: 8.9 g/dL — ABNORMAL LOW (ref 12.0–15.0)
MCH: 28.7 pg (ref 26.0–34.0)
MCHC: 32.8 g/dL (ref 30.0–36.0)
MCV: 87.4 fL (ref 80.0–100.0)
Platelets: 146 10*3/uL — ABNORMAL LOW (ref 150–400)
RBC: 3.1 MIL/uL — ABNORMAL LOW (ref 3.87–5.11)
RDW: 13.6 % (ref 11.5–15.5)
WBC: 3.2 10*3/uL — ABNORMAL LOW (ref 4.0–10.5)
nRBC: 0 % (ref 0.0–0.2)

## 2023-08-07 LAB — BASIC METABOLIC PANEL
Anion gap: 5 (ref 5–15)
BUN: 9 mg/dL (ref 8–23)
CO2: 21 mmol/L — ABNORMAL LOW (ref 22–32)
Calcium: 7.9 mg/dL — ABNORMAL LOW (ref 8.9–10.3)
Chloride: 111 mmol/L (ref 98–111)
Creatinine, Ser: 0.81 mg/dL (ref 0.44–1.00)
GFR, Estimated: >60 mL/min (ref >60)
Glucose, Bld: 98 mg/dL (ref 70–99)
Potassium: 3.7 mmol/L (ref 3.5–5.1)
Sodium: 137 mmol/L (ref 135–145)

## 2023-08-07 LAB — MAGNESIUM: Magnesium: 1.8 mg/dL (ref 1.7–2.4)

## 2023-08-07 LAB — PHOSPHORUS: Phosphorus: 2.4 mg/dL — ABNORMAL LOW (ref 2.5–4.6)

## 2023-08-07 MED ORDER — DEXTROSE 5 % IV SOLN
30.0000 mmol | Freq: Once | INTRAVENOUS | Status: AC
  Administered 2023-08-07: 30 mmol via INTRAVENOUS
  Filled 2023-08-07: qty 10

## 2023-08-07 MED ORDER — MAGNESIUM OXIDE -MG SUPPLEMENT 400 (240 MG) MG PO TABS
800.0000 mg | ORAL_TABLET | Freq: Once | ORAL | Status: AC
  Administered 2023-08-07: 800 mg via ORAL
  Filled 2023-08-07: qty 1
  Filled 2023-08-07: qty 2

## 2023-08-07 NOTE — Progress Notes (Signed)
 Mobility Specialist - Progress Note   08/07/23 1416  Mobility  Activity Ambulated independently in hallway;Ambulated independently to bathroom  Level of Assistance Independent  Assistive Device None  Distance Ambulated (ft) 350 ft  Activity Response Tolerated well  Mobility Referral Yes  Mobility visit 1 Mobility  Mobility Specialist Start Time (ACUTE ONLY) 1352  Mobility Specialist Stop Time (ACUTE ONLY) 1406  Mobility Specialist Time Calculation (min) (ACUTE ONLY) 14 min   Pt received in bed and agreeable to mobility. Prior to ambulating, pt requested assistance to the bathroom. No complaints during session. Pt to bed after session with all needs met.    [facility]

## 2023-08-07 NOTE — Telephone Encounter (Signed)
 Patient Product/process development scientist completed.    The patient is insured through [facility]. Patient has Medicare and is not eligible for a copay card, but may be able to apply for patient assistance or Medicare RX Payment Plan (Patient Must reach out to their plan, if eligible for payment plan), if available.    Ran test claim for doxycycline  25 mg/5 ml Susr and the current 5 day co-pay is $22.64.   This test claim was processed through [facility]- copay amounts may vary at other pharmacies due to pharmacy/plan contracts, or as the patient moves through the different stages of their insurance plan.     Morgan Arab, CPHT Pharmacy Technician III Certified Patient Advocate [facility] Patient Advocate Team Direct Number: [phone]  Fax: [phone]

## 2023-08-07 NOTE — TOC CM/SW Note (Signed)
 Transition of Care [facility]) - Inpatient Brief Assessment   Patient Details  Name: Jasmine Cantu MRN: [id number] Date of Birth: February 27, 1941  Transition of Care [facility]) CM/SW Contact:    Bari Leys, RN Phone Number: 08/07/2023, 11:29 AM   Clinical Narrative: Met with pt and dtr at bedside to introduce role of TOC/NCM and review for dc planning, pt reports she has an established PCP and pharmacy, no current home  care services or home DME, reports she lives alone but her son lives across the street and assist with her care, prepares meals, confirmed transportation available at discharge. TOC Brief Assessment completed. No TOC needs identified at this time.     Transition of Care Asessment: Insurance and Status: Insurance coverage has been reviewed Patient has primary care physician: Yes Home environment has been reviewed: resides alone in private residence, son lives in neighborhood, assist with care Prior level of function:: Independent Prior/Current Home Services: No current home services Social Drivers of Health Review: SDOH reviewed no interventions necessary Readmission risk has been reviewed: Yes Transition of care needs: no transition of care needs at this time

## 2023-08-07 NOTE — Consult Note (Signed)
 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  She has had multiple loose stools a day. - C. difficile toxin and antigen were positive. - C. difficile history includes positive C. difficile testing on 10/3 treated with vancomycin  p.o.  Then she presented again to the ED on 10/29.  CT showed diffuse colitis.  She was empirically treated with vancomycin  for C. difficile.  Unclear if this was a recurrent episode of C. difficile.  Versus refractory to treatment.  She received ComplEye line patient 11/13.  She reports her diarrhea had resolved since Zinplava  until she was starting antibiotics. - GIP on 08/04/2023 positive for Yersinia enterocolitica #Doxy intolerance /pill esophagitis -Report N/V as well as pain with swallowing doxy. Amenable to IV doxy Recommendations:  -IV doxy x 5d for yersenia. Anti-emetics -Dificid   x 10d for cdiff -Discussed secondary prophylaxis for C. difficile with vancomycin  125 mg p.o. twice daily if patient is on antibiotics, continue vancomycin  7 days out from cessation of antibiotics. -ID will sign off Microbiology:   Antibiotics:  Vancomycin  Cultures: Blood  Urine  Other 08/04/2023 C. difficile toxin and antigen positive 04/24/24 C. difficile toxin antigen positive  HPI: Jasmine Cantu is a 84 y.o. female with history of recurrent C. difficile infection requiring Zinplava  in November, hypertension, stage Ia breast cancer s/p  lumpectomy and tamoxifen , bowel ischemia with perforation status post ex lap and small bowel resection on June 2023, GERD, vitamin B12 deficiency admitted with C. difficile diarrhea.  Patient has been on a 7-day course of Ceftin which stopped about a week ago for UTI.  Day of admission patient had multiple loose stools that are watery.  On arrival to ED vitals are stable.  WBC 7.3K.  CT abdomen pelvis showed submucosal Formation involving entire colon and rectum consistent pancolitis as well as high density material in the right renal pelvis, dilated ureter with concern for blood in ureter.  She was started on p.o. vancomycin .Bowel ischemia with perforation status post urology was consulted as well given outpatient urology follow-up with possible diagnostic ureteroscopy. ew of Systems: ROS  Past Medical History:  Diagnosis Date   Allergy    Anemia    C. difficile diarrhea    Cataract    Collagenous colitis 2004   Diverticulosis    Esophageal stricture 1988   GERD (gastroesophageal reflux disease)    H. pylori infection    Hiatal hernia    Hypertension    IBS (irritable bowel syndrome)    Iron deficiency anemia    Tubular adenoma of colon    Ulcer    Vitamin B12 deficiency     Social History   Tobacco Use   Smoking status: Never   Smokeless tobacco: Never  Vaping Use   Vaping status: Never Used  Substance Use Topics   Alcohol use: No   Drug use: No    Family History  Problem Relation Age  of Onset   Diabetes Mother    Colon cancer Mother    Stroke Father    Cancer Sister    Cancer Brother    Heart attack Brother    Cancer Brother    Heart attack Brother    Scheduled Meds:  enoxaparin  (LOVENOX ) injection  40 mg Subcutaneous Q24H   fidaxomicin   200 mg Oral BID   tamoxifen   20 mg Oral Daily   Continuous Infusions:  sodium chloride  50 mL/hr at 08/06/23 1159   doxycycline  (VIBRAMYCIN ) IV 100 mg (08/06/23 1837)   PRN Meds:.acetaminophen  **OR** acetaminophen , lip balm,  ondansetron  **OR** ondansetron  (ZOFRAN ) IV, senna-docusate, zolpidem  Allergies  Allergen Reactions   Doxycycline  Nausea And Vomiting    Also pill esophagitis   Codeine Other (See Comments)    GI upset   Hydrocodone  Nausea Only    OBJECTIVE: Blood pressure (!) 144/60, pulse (!) 57, temperature 97.8 F (36.6 C), temperature source Oral, resp. rate 17, height 5' 3 (1.6 m), weight 56.7 kg, SpO2 97%.  Physical Exam  Lab Results Lab Results  Component Value Date   WBC 3.2 (L) 08/07/2023   HGB 8.9 (L) 08/07/2023   HCT 27.1 (L) 08/07/2023   MCV 87.4 08/07/2023   PLT 146 (L) 08/07/2023    Lab Results  Component Value Date   CREATININE 0.81 08/07/2023   BUN 9 08/07/2023   NA 137 08/07/2023   K 3.7 08/07/2023   CL 111 08/07/2023   CO2 21 (L) 08/07/2023    Lab Results  Component Value Date   ALT 29 08/04/2023   AST 41 08/04/2023   ALKPHOS 51 08/04/2023   BILITOT 0.7 08/04/2023       Loney Stank, MD [facility] 08/07/2023, 5:38 AM   I have personally spent 82 minutes involved in face-to-face and non-face-to-face activities for this patient on the day of the visit. Professional time spent includes the following activities: Preparing to see the patient (review of tests), Obtaining and/or reviewing separately obtained history (admission/discharge record), Performing a medically appropriate examination and/or evaluation , Ordering medications/tests/procedures, referring and communicating with other health care professionals, Documenting clinical information in the EMR, Independently interpreting results (not separately reported), Communicating results to the patient/family/caregiver, Counseling and educating the patient/family/caregiver and Care coordination (not separately reported).

## 2023-08-07 NOTE — Progress Notes (Signed)
 PROGRESS NOTE    Jasmine Cantu  WUJ:[id number] DOB: Apr 03, 1941 DOA: 08/04/2023 PCP: Victorio Grave, MD    Brief Narrative:  83 year old with history of C. difficile, HTN, right breast cancer status postlumpectomy on tamoxifen , bowel ischemia with perforation status post ex lap and small bowel resection in June 2023, GERD, vitamin B12 deficiency, iron deficiency anemia comes to the hospital with complaints of diarrhea.  Patient has previously been treated twice for C. difficile infection in the last year with vancomycin .  Developed UTI about 3 weeks ago and completed 7-day course of Ceftin.  Now having diarrhea again.  Upon admission noted to have pancolitis and tested positive for C. difficile.   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 vancomycin  now developed C. difficile again post UTI treatment with p.o. antibiotics.   -IV Doxy for 5 days, change to oral solution upon dc.  Appreciate ID input, current recommendations: -Dificid  for 10 days.  In the future anytime patient goes on p.o. antibiotics for any reason, should be on vancomycin  twice daily p.o. prophylaxis and extended for 7 days after cessation of antibiotics.   Dilated R ureter On CT abdomen pelvis shows high density material in the right renal pelvis and dilated ureter.  Urology recommending outpatient follow-up once the acute illness is over.   Essential hypertension Apparently was taken off BP medication IV as needed   Breast cancer Stage IA right breast cancer diagnosed last year now status post lumpectomy and on tamoxifen  since August 2024. -Continue tamoxifen  -Follow-up with oncology in the outpatient   Vitamin B12 and iron deficiency anemia Hemoglobin of 10.6 around her baseline of 10. -Continue outpatient B12  injections   Hypokalemia:  As needed repletion     DVT prophylaxis: enoxaparin  (LOVENOX ) injection 40 mg Start: 08/04/23 2200   Code Status: Full Code  Family Communication: Daughter present at bedside.  Continue hospital stay for management of C. Difficile Hoping to discharge her tomorrow morning     Subjective: Doing well.  Abdominal discomfort is slowly improving.  Has not had a bowel movement yet this morning but had 5 liquidy bowel movement yesterday  Examination:  General exam: Appears calm and comfortable  Respiratory system: Clear to auscultation. Respiratory effort normal. Cardiovascular system: S1 & S2 heard, RRR. No JVD, murmurs, rubs, gallops or clicks. No pedal edema. Gastrointestinal system: Abdomen is nondistended, soft and nontender. No organomegaly or masses felt. Normal bowel sounds heard. Central nervous system: Alert and oriented. No focal neurological deficits. Extremities: Symmetric 5 x 5 power. Skin: No rashes, lesions or ulcers Psychiatry: Judgement and insight appear normal. Mood & affect appropriate.                Diet Orders (From admission, onward)     Start     Ordered   08/04/23 2002  Diet regular Room service appropriate? Yes; Fluid consistency: Thin  Diet effective now       Question Answer Comment  Room service appropriate? Yes   Fluid consistency: Thin      08/04/23 2001            Objective: Vitals:   08/06/23 0328 08/06/23 1310 08/06/23 1922 08/07/23 0542  BP: (!) 151/62 (!) 153/62 (!) 144/60 (!) 154/55  Pulse: 67 (!) 58 (!) 57 66  Resp: 19 18 17 15   Temp: 97.6 F (36.4 C) 97.7 F (36.5 C) 97.8 F (36.6 C) (!) 97.4 F (36.3 C)  TempSrc:  Oral Oral Oral  SpO2: 100% 99% 97% 99%  Weight:      Height:        Intake/Output Summary (Last 24 hours) at 08/07/2023 1308 Last data filed at 08/07/2023 1000 Gross per 24 hour  Intake 1226.52 ml  Output --  Net 1226.52 ml   Filed Weights   08/04/23 1157  Weight:  56.7 kg    Scheduled Meds:  enoxaparin  (LOVENOX ) injection  40 mg Subcutaneous Q24H   fidaxomicin   200 mg Oral BID   tamoxifen   20 mg Oral Daily   Continuous Infusions:  doxycycline  (VIBRAMYCIN ) IV 100 mg (08/07/23 1155)   potassium PHOSPHATE  IVPB (in mmol) 30 mmol (08/07/23 0906)    Nutritional status     Body mass index is 22.14 kg/m.  Data Reviewed:   CBC: Recent Labs  Lab 08/04/23 1434 08/05/23 0430 08/06/23 0426 08/07/23 0429  WBC 7.3 4.0 3.9* 3.2*  NEUTROABS 6.5  --  2.2  --   HGB 10.6* 9.4* 9.0* 8.9*  HCT 31.6* 28.9* 26.9* 27.1*  MCV 86.1 90.0 87.3 87.4  PLT 162 135* 152 146*   Basic Metabolic Panel: Recent Labs  Lab 08/04/23 1200 08/05/23 0430 08/06/23 0426 08/07/23 0429  NA 134* 140 135 137  K 3.5 3.4* 3.2* 3.7  CL 104 111 110 111  CO2 20* 23 22 21*  GLUCOSE 138* 92 94 98  BUN 15 12 10 9   CREATININE 0.86 0.83 0.83 0.81  CALCIUM 8.2* 8.0* 7.7* 7.9*  MG  --   --  1.7 1.8  PHOS  --   --   --  2.4*   GFR: Estimated Creatinine Clearance: 42.8 mL/min (by C-G formula based on SCr of 0.81 mg/dL). Liver Function Tests: Recent Labs  Lab 08/04/23 1200  AST 41  ALT 29  ALKPHOS 51  BILITOT 0.7  PROT 6.5  ALBUMIN 3.2*   No results for input(s): "LIPASE", "AMYLASE" in the last 168 hours. No results for input(s): "AMMONIA" in the last 168 hours. Coagulation Profile: No results for input(s): "INR", "PROTIME" in the last 168 hours. Cardiac Enzymes: No results for input(s): "CKTOTAL", "CKMB", "CKMBINDEX", "TROPONINI" in the last 168 hours. BNP (last 3 results) No results for input(s): "PROBNP" in the last 8760 hours. HbA1C: No results for input(s): "HGBA1C" in the last 72 hours. CBG: No results for input(s): "GLUCAP" in the last 168 hours. Lipid Profile: No results for input(s): "CHOL", "HDL", "LDLCALC", "TRIG", "CHOLHDL", "LDLDIRECT" in the last 72 hours. Thyroid Function Tests: No results for input(s): "TSH", "T4TOTAL", "FREET4", "T3FREE",  "THYROIDAB" in the last 72 hours. Anemia Panel: No results for input(s): "VITAMINB12", "FOLATE", "FERRITIN", "TIBC", "IRON", "RETICCTPCT" in the last 72 hours. Sepsis Labs: No results for input(s): "PROCALCITON", "LATICACIDVEN" in the last 168 hours.  Recent Results (from the past 240 hours)  Gastrointestinal Panel by PCR , Stool     Status: Abnormal   Collection Time: 08/04/23  9:44 PM   Specimen: Stool  Result Value Ref Range Status   Campylobacter species NOT DETECTED NOT DETECTED Final   Plesimonas shigelloides NOT DETECTED NOT DETECTED Final   Salmonella species NOT DETECTED NOT DETECTED Final   Yersinia enterocolitica DETECTED (A) NOT DETECTED Final    Comment: RESULT CALLED TO, READ BACK BY AND VERIFIED WITH: SUSAN CAUDEL 08/06/23 1114 KLW    Vibrio species  NOT DETECTED NOT DETECTED Final   Vibrio cholerae NOT DETECTED NOT DETECTED Final   Enteroaggregative E coli (EAEC) NOT DETECTED NOT DETECTED Final   Enteropathogenic E coli (EPEC) NOT DETECTED NOT DETECTED Final   Enterotoxigenic E coli (ETEC) NOT DETECTED NOT DETECTED Final   Shiga like toxin producing E coli (STEC) NOT DETECTED NOT DETECTED Final   Shigella/Enteroinvasive E coli (EIEC) NOT DETECTED NOT DETECTED Final   Cryptosporidium NOT DETECTED NOT DETECTED Final   Cyclospora cayetanensis NOT DETECTED NOT DETECTED Final   Entamoeba histolytica NOT DETECTED NOT DETECTED Final   Giardia lamblia NOT DETECTED NOT DETECTED Final   Adenovirus F40/41 NOT DETECTED NOT DETECTED Final   Astrovirus NOT DETECTED NOT DETECTED Final   Norovirus GI/GII NOT DETECTED NOT DETECTED Final   Rotavirus A NOT DETECTED NOT DETECTED Final   Sapovirus (I, II, IV, and V) NOT DETECTED NOT DETECTED Final    Comment: Performed at [facility], [address] [place], [place] [address]  C Difficile Quick Screen w PCR reflex     Status: Abnormal   Collection Time: 08/04/23  9:44 PM   Specimen: STOOL  Result Value Ref Range Status   C  Diff antigen POSITIVE (A) NEGATIVE Final   C Diff toxin POSITIVE (A) NEGATIVE Final   C Diff interpretation Toxin producing C. difficile detected.  Final    Comment: RESULT CALLED TO, READ BACK BY AND VERIFIED WITH: ANANCI,H AT 2310 ON 08/04/23 BY LUZOLOP Performed at [facility], [address], [place], [place] [address]          Radiology Studies: No results found.         LOS: 2 days   Time spent= 35 mins    Maggie Schooner, MD [facility]  If 7PM-7AM, please contact night-coverage  08/07/2023, 1:08 PM

## 2023-08-07 NOTE — Plan of Care (Signed)

## 2023-08-08 ENCOUNTER — Other Ambulatory Visit (HOSPITAL_COMMUNITY): Payer: Self-pay

## 2023-08-08 DIAGNOSIS — K5100 Ulcerative (chronic) pancolitis without complications: Secondary | ICD-10-CM | POA: Diagnosis not present

## 2023-08-08 LAB — CBC
HCT: 28.5 % — ABNORMAL LOW (ref 36.0–46.0)
Hemoglobin: 9.3 g/dL — ABNORMAL LOW (ref 12.0–15.0)
MCH: 28.5 pg (ref 26.0–34.0)
MCHC: 32.6 g/dL (ref 30.0–36.0)
MCV: 87.4 fL (ref 80.0–100.0)
Platelets: 146 10*3/uL — ABNORMAL LOW (ref 150–400)
RBC: 3.26 MIL/uL — ABNORMAL LOW (ref 3.87–5.11)
RDW: 13.4 % (ref 11.5–15.5)
WBC: 3.7 10*3/uL — ABNORMAL LOW (ref 4.0–10.5)
nRBC: 0 % (ref 0.0–0.2)

## 2023-08-08 LAB — BASIC METABOLIC PANEL
Anion gap: 7 (ref 5–15)
BUN: 11 mg/dL (ref 8–23)
CO2: 20 mmol/L — ABNORMAL LOW (ref 22–32)
Calcium: 7.9 mg/dL — ABNORMAL LOW (ref 8.9–10.3)
Chloride: 108 mmol/L (ref 98–111)
Creatinine, Ser: 0.91 mg/dL (ref 0.44–1.00)
GFR, Estimated: >60 mL/min (ref >60)
Glucose, Bld: 95 mg/dL (ref 70–99)
Potassium: 3.8 mmol/L (ref 3.5–5.1)
Sodium: 135 mmol/L (ref 135–145)

## 2023-08-08 LAB — MAGNESIUM: Magnesium: 1.8 mg/dL (ref 1.7–2.4)

## 2023-08-08 MED ORDER — FIDAXOMICIN 200 MG PO TABS
200.0000 mg | ORAL_TABLET | Freq: Two times a day (BID) | ORAL | 0 refills | Status: AC
  Filled 2023-08-08: qty 14, 7d supply, fill #0

## 2023-08-08 MED ORDER — DOXYCYCLINE MONOHYDRATE 25 MG/5ML PO SUSR
100.0000 mg | Freq: Two times a day (BID) | ORAL | 0 refills | Status: AC
  Filled 2023-08-08: qty 120, 3d supply, fill #0

## 2023-08-08 MED ORDER — DOXYCYCLINE MONOHYDRATE 25 MG/5ML PO SUSR
100.0000 mg | Freq: Two times a day (BID) | ORAL | 0 refills | Status: DC
  Filled 2023-08-08: qty 240, 6d supply, fill #0

## 2023-08-08 NOTE — Progress Notes (Signed)
Patient discharged via wheelchair with daughter at side. Patient's daughter had all belongings which included clothes. Patient verbalized understanding with the discharge instructions. Patient in no distress at time of discharge.

## 2023-08-08 NOTE — Plan of Care (Signed)
Patient is A&O x 2, confused. VSS, on room air. Enteric isolation precautions maintained.  Safety maintained. Bed alarm on. Call bell in reach. Will continue to monitor.    Problem: Education: Goal: Knowledge of General Education information will improve Description: Including pain rating scale, medication(s)/side effects and non-pharmacologic comfort measures Outcome: Progressing   Problem: Health Behavior/Discharge Planning: Goal: Ability to manage health-related needs will improve Outcome: Progressing   Problem: Clinical Measurements: Goal: Ability to maintain clinical measurements within normal limits will improve Outcome: Progressing Goal: Will remain free from infection Outcome: Progressing Goal: Diagnostic test results will improve Outcome: Progressing Goal: Respiratory complications will improve Outcome: Progressing Goal: Cardiovascular complication will be avoided Outcome: Progressing   Problem: Activity: Goal: Risk for activity intolerance will decrease Outcome: Progressing   Problem: Nutrition: Goal: Adequate nutrition will be maintained Outcome: Progressing   Problem: Coping: Goal: Level of anxiety will decrease Outcome: Progressing   Problem: Elimination: Goal: Will not experience complications related to bowel motility Outcome: Progressing Goal: Will not experience complications related to urinary retention Outcome: Progressing   Problem: Pain Management: Goal: General experience of comfort will improve Outcome: Progressing   Problem: Safety: Goal: Ability to remain free from injury will improve Outcome: Progressing   Problem: Skin Integrity: Goal: Risk for impaired skin integrity will decrease Outcome: Progressing

## 2023-08-08 NOTE — Discharge Summary (Signed)
Physician Discharge Summary  Jasmine Cantu OZH:[id number] DOB: 26-Oct-1940 DOA: 08/04/2023  PCP: Laurann Montana, MD  Admit date: 08/04/2023 Discharge date: 08/08/2023  Admitted From: [facility] Disposition:  Home  Recommendations for Outpatient Follow-up:  Follow up with PCP in 1-2 weeks Please obtain BMP/CBC in one week your next doctors visit.  PO Doxycycline for 3 days to complete total 5-day course P.o. Dificid twice daily for 7 more days to complete total 10-day course Patient has been advised to start vancomycin p.o. twice daily for C. difficile prophylaxis anytime she gets placed on antibiotics and should continue 7 days post cessation of antibiotics. Outpatient follow-up with urology   Discharge Condition: Stable CODE STATUS: Full code Diet recommendation: Low-salt  Brief/Interim Summary: Brief Narrative:  83 year old with history of C. difficile, HTN, right breast cancer status postlumpectomy on tamoxifen, bowel ischemia with perforation status post ex lap and small bowel resection in June 2023, GERD, vitamin B12 deficiency, iron deficiency anemia comes to the hospital with complaints of diarrhea.  Patient has previously been treated twice for C. difficile infection in the last year with vancomycin.  Developed UTI about 3 weeks ago and completed 7-day course of Ceftin.  Now having diarrhea again.  Upon admission noted to have pancolitis and tested positive for C. difficile.  Eventually also tested positive for Yersinia.  ID was consulted.  Antibiotics were switched to Dificid and doxycycline.   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 vancomycin now developed C. difficile again post UTI treatment with p.o. antibiotics.   -IV Doxy for 5 days, change to oral solution upon dc.  EOT  1/18 Appreciate ID input, current recommendations: -Dificid for 10 days.  EOT 08/14/2022.  In the future anytime patient goes on p.o. antibiotics for any reason, should be on vancomycin twice daily p.o. prophylaxis and extended for 7 days after cessation of antibiotics.   Dilated R ureter On CT abdomen pelvis shows high density material in the right renal pelvis and dilated ureter.  Urology recommending outpatient follow-up once the acute illness is over.   Essential hypertension Apparently was taken off BP medication IV as needed   Breast cancer Stage IA right breast cancer diagnosed last year now status post lumpectomy and on tamoxifen since August 2024. -Continue tamoxifen -Follow-up with oncology in the outpatient   Vitamin B12 and iron deficiency anemia Hemoglobin of 10.6 around her baseline of 10. -Continue outpatient B12 injections   Hypokalemia:  As needed repletion     DVT prophylaxis: enoxaparin (LOVENOX) injection 40 mg Start: 08/04/23 2200   Code Status: Full Code  Family Communication: Daughter present at bedside.  Continue hospital stay for management of C. Difficile Hoping to discharge her tomorrow morning     Subjective: Doing well.  Abdominal discomfort is slowly improving.  Has not had a bowel movement yet this morning but had 5 liquidy bowel movement yesterday  Examination:  General exam: Appears calm and comfortable  Respiratory system: Clear to auscultation. Respiratory effort normal. Cardiovascular system: S1 & S2 heard, RRR. No JVD, murmurs, rubs, gallops or clicks. No pedal edema. Gastrointestinal system: Abdomen is nondistended, soft and nontender. No organomegaly or masses felt. Normal bowel sounds heard. Central nervous system: Alert and oriented. No focal neurological deficits. Extremities: Symmetric 5 x 5 power. Skin: No rashes,  lesions or ulcers Psychiatry: Judgement and insight appear normal. Mood & affect appropriate.    Discharge  Diagnoses:  Principal Problem:   Pancolitis [facility]) Active Problems:   Diarrhea   Right ureter dilated      Discharge Exam: Vitals:   08/08/23 0421 08/08/23 1328  BP: (!) 120/51 (!) 143/54  Pulse: (!) 59 (!) 58  Resp: 17 16  Temp: (!) 97.5 F (36.4 C) 97.7 F (36.5 C)  SpO2: 97% 98%   Vitals:   08/07/23 1335 08/07/23 1934 08/08/23 0421 08/08/23 1328  BP: 127/72 (!) 138/55 (!) 120/51 (!) 143/54  Pulse: (!) 58 61 (!) 59 (!) 58  Resp: 17 18 17 16   Temp: 98.4 F (36.9 C) 98.9 F (37.2 C) (!) 97.5 F (36.4 C) 97.7 F (36.5 C)  TempSrc: Oral     SpO2: 99% 96% 97% 98%  Weight:      Height:          Discharge Instructions   Allergies as of 08/08/2023       Reactions   Doxycycline Nausea And Vomiting   Also pill esophagitis   Codeine Other (See Comments)   GI upset   Hydrocodone Nausea Only        Medication List     STOP taking these medications    benazepril 10 MG tablet Commonly known as: LOTENSIN   cholestyramine 4 g packet Commonly known as: QUESTRAN   traMADol 50 MG tablet Commonly known as: ULTRAM   vancomycin 125 MG capsule Commonly known as: VANCOCIN       TAKE these medications    acetaminophen 500 MG tablet Commonly known as: TYLENOL Take 500 mg by mouth every 6 (six) hours as needed for mild pain.   aspirin EC 81 MG tablet Take 81 mg by mouth daily. Swallow whole.   cyanocobalamin 1000 MCG/ML injection Commonly known as: VITAMIN B12 Inject 1,000 mcg into the muscle every 30 (thirty) days.   Dificid 200 MG Tabs tablet Generic drug: fidaxomicin Take 1 tablet (200 mg total) by mouth 2 (two) times daily for 7 days.   doxycycline 25 MG/5ML Susr Commonly known as: VIBRAMYCIN Take 20 mLs (100 mg total) by mouth 2 (two) times daily for 3 days.   hydrocortisone 2.5 % rectal cream Commonly known as: ANUSOL-HC Apply 1 Application topically as needed for hemorrhoids or anal itching.   PROBIOTIC BLEND PO Take 1 capsule by mouth  daily.   tamoxifen 20 MG tablet Commonly known as: NOLVADEX Take 1 tablet (20 mg total) by mouth daily.        Follow-up Information     Laurann Montana, MD Follow up in 1 week(s).   Specialty: Family Medicine Contact information: [phone] [address] [place] [address] [phone]         Laurann Montana, MD .   Specialty: Family Medicine Contact information: [address] [place] [address] [phone]                Allergies  Allergen Reactions   Doxycycline Nausea And Vomiting    Also pill esophagitis   Codeine Other (See Comments)    GI upset   Hydrocodone Nausea Only    You were cared for by a hospitalist during your hospital stay. If you have any questions about your discharge medications or the care you received while you were in the hospital after you are discharged, you can call the unit and asked to speak with  the hospitalist on call if the hospitalist that took care of you is not available. Once you are discharged, your primary care physician will handle any further medical issues. Please note that no refills for any discharge medications will be authorized once you are discharged, as it is imperative that you return to your primary care physician (or establish a relationship with a primary care physician if you do not have one) for your aftercare needs so that they can reassess your need for medications and monitor your lab values.  You were cared for by a hospitalist during your hospital stay. If you have any questions about your discharge medications or the care you received while you were in the hospital after you are discharged, you can call the unit and asked to speak with the hospitalist on call if the hospitalist that took care of you is not available. Once you are discharged, your primary care physician will handle any further medical issues. Please note that NO REFILLS for any discharge medications will be authorized  once you are discharged, as it is imperative that you return to your primary care physician (or establish a relationship with a primary care physician if you do not have one) for your aftercare needs so that they can reassess your need for medications and monitor your lab values.  Please request your Prim.MD to go over all Hospital Tests and Procedure/Radiological results at the follow up, please get all Hospital records sent to your Prim MD by signing hospital release before you go home.  Get CBC, CMP, 2 view Chest X ray checked  by Primary MD during your next visit or SNF MD in 5-7 days ( we routinely change or add medications that can affect your baseline labs and fluid status, therefore we recommend that you get the mentioned basic workup next visit with your PCP, your PCP may decide not to get them or add new tests based on their clinical decision)  On your next visit with your primary care physician please Get Medicines reviewed and adjusted.  If you experience worsening of your admission symptoms, develop shortness of breath, life threatening emergency, suicidal or homicidal thoughts you must seek medical attention immediately by calling 911 or calling your MD immediately  if symptoms less severe.  You Must read complete instructions/literature along with all the possible adverse reactions/side effects for all the Medicines you take and that have been prescribed to you. Take any new Medicines after you have completely understood and accpet all the possible adverse reactions/side effects.   Do not drive, operate heavy machinery, perform activities at heights, swimming or participation in water activities or provide baby sitting services if your were admitted for syncope or siezures until you have seen by Primary MD or a Neurologist and advised to do so again.  Do not drive when taking Pain medications.   Procedures/Studies: CT ABDOMEN PELVIS W CONTRAST Result Date: 08/04/2023 CLINICAL DATA:   Abdominal pain. Acute nonlocalized. LEFT lower quadrant pain. Pain with diarrhea for 3 4 days. EXAM: CT ABDOMEN AND PELVIS WITH CONTRAST TECHNIQUE: Multidetector CT imaging of the abdomen and pelvis was performed using the standard protocol following bolus administration of intravenous contrast. RADIATION DOSE REDUCTION: This exam was performed according to the departmental dose-optimization program which includes automated exposure control, adjustment of the mA and/or kV according to patient size and/or use of iterative reconstruction technique. CONTRAST:  75mL OMNIPAQUE IOHEXOL 300 MG/ML  SOLN COMPARISON:  CT 05/21/2023 FINDINGS: Lower chest: Lung bases are  clear. Hepatobiliary: No focal hepatic lesion. Postcholecystectomy. No biliary dilatation. Pancreas: Pancreas is normal. No ductal dilatation. No pancreatic inflammation. Spleen: Normal spleen Adrenals/urinary tract: Adrenal glands are normal. There is high-density fluid/material within the mildly dilated RIGHT renal pelvis and ureter. The RIGHT renal pelvis with HU equal 74 compared to the HU equal 8.5 on the LEFT. Delayed imaging demonstrates no clear filling defect within the dilated proximal RIGHT collecting system. Bladder normal. Stomach/Bowel: Large hiatal hernia. Majority of stomach above the diaphragm. The duodenum and small bowel are normal. Small bowel anastomosis in the LEFT upper abdomen. Appendix not identified. There is submucosal edema and serosal inflammation involving the ascending colon and. Mild submucosal edema involving the transverse and descending colon lesser degree. There multiple diverticula of the descending colon. There is submucosal edema involving the sigmoid colon and rectum. No perforation or abscess.  No pneumatosis.  No portal venous gas. Vascular/Lymphatic: Abdominal aorta is normal caliber with atherosclerotic calcification. There is no retroperitoneal or periportal lymphadenopathy. No pelvic lymphadenopathy. Reproductive:  Post hysterectomy.  Adnexa unremarkable Other: No free fluid. Musculoskeletal: No aggressive osseous lesion. IMPRESSION: 1. Submucosal edema and inflammation involving the entire colon to the rectum. Findings consistent with pancolitis. Differential would include infectious colitis versus inflammatory bowel disease. 2. Extensive diverticulosis of the LEFT colon without clear acute diverticulitis. 3. High-density material within the RIGHT renal pelvis and dilated ureter. Findings concerning for blood within the ureter. Recommend clinical correlation. No source of hematuria identified within the RIGHT kidney. Consider nonemergent urology consultation. Electronically Signed   By: Genevive Bi M.D.   On: 08/04/2023 16:08     The results of significant diagnostics from this hospitalization (including imaging, microbiology, ancillary and laboratory) are listed below for reference.     Microbiology: Recent Results (from the past 240 hours)  Gastrointestinal Panel by PCR , Stool     Status: Abnormal   Collection Time: 08/04/23  9:44 PM   Specimen: Stool  Result Value Ref Range Status   Campylobacter species NOT DETECTED NOT DETECTED Final   Plesimonas shigelloides NOT DETECTED NOT DETECTED Final   Salmonella species NOT DETECTED NOT DETECTED Final   Yersinia enterocolitica DETECTED (A) NOT DETECTED Final    Comment: RESULT CALLED TO, READ BACK BY AND VERIFIED WITH: SUSAN CAUDEL 08/06/23 1114 KLW    Vibrio species NOT DETECTED NOT DETECTED Final   Vibrio cholerae NOT DETECTED NOT DETECTED Final   Enteroaggregative E coli (EAEC) NOT DETECTED NOT DETECTED Final   Enteropathogenic E coli (EPEC) NOT DETECTED NOT DETECTED Final   Enterotoxigenic E coli (ETEC) NOT DETECTED NOT DETECTED Final   Shiga like toxin producing E coli (STEC) NOT DETECTED NOT DETECTED Final   Shigella/Enteroinvasive E coli (EIEC) NOT DETECTED NOT DETECTED Final   Cryptosporidium NOT DETECTED NOT DETECTED Final   Cyclospora  cayetanensis NOT DETECTED NOT DETECTED Final   Entamoeba histolytica NOT DETECTED NOT DETECTED Final   Giardia lamblia NOT DETECTED NOT DETECTED Final   Adenovirus F40/41 NOT DETECTED NOT DETECTED Final   Astrovirus NOT DETECTED NOT DETECTED Final   Norovirus GI/GII NOT DETECTED NOT DETECTED Final   Rotavirus A NOT DETECTED NOT DETECTED Final   Sapovirus (I, II, IV, and V) NOT DETECTED NOT DETECTED Final    Comment: Performed at [facility], [address] [place], [place] [address]  C Difficile Quick Screen w PCR reflex     Status: Abnormal   Collection Time: 08/04/23  9:44 PM   Specimen: STOOL  Result  Value Ref Range Status   C Diff antigen POSITIVE (A) NEGATIVE Final   C Diff toxin POSITIVE (A) NEGATIVE Final   C Diff interpretation Toxin producing C. difficile detected.  Final    Comment: RESULT CALLED TO, READ BACK BY AND VERIFIED WITH: ANANCI,H AT 2310 ON 08/04/23 BY LUZOLOP Performed at [facility], [address], [place], [place] [address]      Labs: BNP (last 3 results) No results for input(s): "BNP" in the last 8760 hours. Basic Metabolic Panel: Recent Labs  Lab 08/04/23 1200 08/05/23 0430 08/06/23 0426 08/07/23 0429 08/08/23 0425  NA 134* 140 135 137 135  K 3.5 3.4* 3.2* 3.7 3.8  CL 104 111 110 111 108  CO2 20* 23 22 21* 20*  GLUCOSE 138* 92 94 98 95  BUN 15 12 10 9 11   CREATININE 0.86 0.83 0.83 0.81 0.91  CALCIUM 8.2* 8.0* 7.7* 7.9* 7.9*  MG  --   --  1.7 1.8 1.8  PHOS  --   --   --  2.4*  --    Liver Function Tests: Recent Labs  Lab 08/04/23 1200  AST 41  ALT 29  ALKPHOS 51  BILITOT 0.7  PROT 6.5  ALBUMIN 3.2*   No results for input(s): "LIPASE", "AMYLASE" in the last 168 hours. No results for input(s): "AMMONIA" in the last 168 hours. CBC: Recent Labs  Lab 08/04/23 1434 08/05/23 0430 08/06/23 0426 08/07/23 0429 08/08/23 0425  WBC 7.3 4.0 3.9* 3.2* 3.7*  NEUTROABS 6.5  --  2.2  --   --   HGB 10.6* 9.4*  9.0* 8.9* 9.3*  HCT 31.6* 28.9* 26.9* 27.1* 28.5*  MCV 86.1 90.0 87.3 87.4 87.4  PLT 162 135* 152 146* 146*   Cardiac Enzymes: No results for input(s): "CKTOTAL", "CKMB", "CKMBINDEX", "TROPONINI" in the last 168 hours. BNP: Invalid input(s): "POCBNP" CBG: No results for input(s): "GLUCAP" in the last 168 hours. D-Dimer No results for input(s): "DDIMER" in the last 72 hours. Hgb A1c No results for input(s): "HGBA1C" in the last 72 hours. Lipid Profile No results for input(s): "CHOL", "HDL", "LDLCALC", "TRIG", "CHOLHDL", "LDLDIRECT" in the last 72 hours. Thyroid function studies No results for input(s): "TSH", "T4TOTAL", "T3FREE", "THYROIDAB" in the last 72 hours.  Invalid input(s): "FREET3" Anemia work up No results for input(s): "VITAMINB12", "FOLATE", "FERRITIN", "TIBC", "IRON", "RETICCTPCT" in the last 72 hours. Urinalysis    Component Value Date/Time   COLORURINE YELLOW 08/04/2023 1802   APPEARANCEUR HAZY (A) 08/04/2023 1802   LABSPEC <=1.005 08/04/2023 1802   PHURINE 5.5 08/04/2023 1802   GLUCOSEU NEGATIVE 08/04/2023 1802   HGBUR NEGATIVE 08/04/2023 1802   BILIRUBINUR NEGATIVE 08/04/2023 1802   BILIRUBINUR negative 11/25/2018 1201   KETONESUR 40 (A) 08/04/2023 1802   PROTEINUR NEGATIVE 08/04/2023 1802   UROBILINOGEN 0.2 11/25/2018 1201   NITRITE NEGATIVE 08/04/2023 1802   LEUKOCYTESUR SMALL (A) 08/04/2023 1802   Sepsis Labs Recent Labs  Lab 08/05/23 0430 08/06/23 0426 08/07/23 0429 08/08/23 0425  WBC 4.0 3.9* 3.2* 3.7*   Microbiology Recent Results (from the past 240 hours)  Gastrointestinal Panel by PCR , Stool     Status: Abnormal   Collection Time: 08/04/23  9:44 PM   Specimen: Stool  Result Value Ref Range Status   Campylobacter species NOT DETECTED NOT DETECTED Final   Plesimonas shigelloides NOT DETECTED NOT DETECTED Final   Salmonella species NOT DETECTED NOT DETECTED Final   Yersinia enterocolitica DETECTED (A) NOT DETECTED Final  Comment:  RESULT CALLED TO, READ BACK BY AND VERIFIED WITH: SUSAN CAUDEL 08/06/23 1114 KLW    Vibrio species NOT DETECTED NOT DETECTED Final   Vibrio cholerae NOT DETECTED NOT DETECTED Final   Enteroaggregative E coli (EAEC) NOT DETECTED NOT DETECTED Final   Enteropathogenic E coli (EPEC) NOT DETECTED NOT DETECTED Final   Enterotoxigenic E coli (ETEC) NOT DETECTED NOT DETECTED Final   Shiga like toxin producing E coli (STEC) NOT DETECTED NOT DETECTED Final   Shigella/Enteroinvasive E coli (EIEC) NOT DETECTED NOT DETECTED Final   Cryptosporidium NOT DETECTED NOT DETECTED Final   Cyclospora cayetanensis NOT DETECTED NOT DETECTED Final   Entamoeba histolytica NOT DETECTED NOT DETECTED Final   Giardia lamblia NOT DETECTED NOT DETECTED Final   Adenovirus F40/41 NOT DETECTED NOT DETECTED Final   Astrovirus NOT DETECTED NOT DETECTED Final   Norovirus GI/GII NOT DETECTED NOT DETECTED Final   Rotavirus A NOT DETECTED NOT DETECTED Final   Sapovirus (I, II, IV, and V) NOT DETECTED NOT DETECTED Final    Comment: Performed at [facility], [address] [place], [place] [address]  C Difficile Quick Screen w PCR reflex     Status: Abnormal   Collection Time: 08/04/23  9:44 PM   Specimen: STOOL  Result Value Ref Range Status   C Diff antigen POSITIVE (A) NEGATIVE Final   C Diff toxin POSITIVE (A) NEGATIVE Final   C Diff interpretation Toxin producing C. difficile detected.  Final    Comment: RESULT CALLED TO, READ BACK BY AND VERIFIED WITH: ANANCI,H AT 2310 ON 08/04/23 BY LUZOLOP Performed at [facility], [address], [place], [place] [address]      Time coordinating discharge:  I have spent 35 minutes face to face with the patient and on the ward discussing the patients care, assessment, plan and disposition with other care givers. >50% of the time was devoted counseling the patient about the risks and benefits of treatment/Discharge disposition and coordinating care.    SIGNED:   Miguel Rota, MD  [facility] 08/08/2023, 2:08 PM   If 7PM-7AM, please contact night-coverage

## 2023-08-19 LAB — MISCELLANEOUS TEST

## 2023-08-24 ENCOUNTER — Ambulatory Visit
Admission: RE | Admit: 2023-08-24 | Discharge: 2023-08-24 | Disposition: A | Discharge status: Home / Self Care | Payer: Medicare Other | Source: Ambulatory Visit | Attending: Family Medicine | Admitting: Family Medicine

## 2023-08-24 DIAGNOSIS — R413 Other amnesia: Secondary | ICD-10-CM

## 2023-08-24 MED ORDER — GADOPICLENOL 0.5 MMOL/ML IV SOLN
7.5000 mL | Freq: Once | INTRAVENOUS | Status: AC | PRN
  Administered 2023-08-24: 5 mL via INTRAVENOUS

## 2023-08-26 ENCOUNTER — Other Ambulatory Visit (HOSPITAL_COMMUNITY): Payer: Self-pay

## 2023-08-30 ENCOUNTER — Encounter: Payer: Medicare Other | Admitting: Adult Health

## 2023-09-04 ENCOUNTER — Other Ambulatory Visit: Payer: Self-pay

## 2023-09-09 ENCOUNTER — Ambulatory Visit: Payer: Medicare Other | Admitting: Hematology and Oncology

## 2023-10-29 ENCOUNTER — Telehealth: Payer: Self-pay | Admitting: Hematology and Oncology

## 2023-10-29 ENCOUNTER — Telehealth: Payer: Self-pay

## 2023-10-29 NOTE — Telephone Encounter (Signed)
 Left vm for pt about rescheduled date and time.

## 2023-10-29 NOTE — Telephone Encounter (Signed)
 Received call from daughter who states appt was added to her mother's schedule but she was not called for this appt to approve the date/time. She needs to r/s d/t other appts that day. Pt was r/s for 5/29 per request.

## 2023-12-02 ENCOUNTER — Ambulatory Visit: Payer: Medicare Other | Admitting: Hematology and Oncology

## 2023-12-05 ENCOUNTER — Ambulatory Visit: Payer: Medicare Other | Admitting: Hematology and Oncology

## 2023-12-10 ENCOUNTER — Ambulatory Visit: Admitting: Hematology and Oncology

## 2023-12-18 ENCOUNTER — Telehealth: Payer: Self-pay

## 2023-12-18 NOTE — Telephone Encounter (Signed)
 Verbally confirmed appointment for 5/29 with daughter, Joette Mustard

## 2023-12-19 ENCOUNTER — Inpatient Hospital Stay: Attending: Hematology and Oncology | Admitting: Hematology and Oncology

## 2023-12-19 VITALS — BP 142/52 | Temp 98.0°F | Resp 18 | Ht 63.0 in | Wt 125.4 lb

## 2023-12-19 DIAGNOSIS — Z170 Estrogen receptor positive status [ER+]: Secondary | ICD-10-CM | POA: Diagnosis not present

## 2023-12-19 DIAGNOSIS — Z1721 Progesterone receptor positive status: Secondary | ICD-10-CM | POA: Insufficient documentation

## 2023-12-19 DIAGNOSIS — Z1732 Human epidermal growth factor receptor 2 negative status: Secondary | ICD-10-CM | POA: Insufficient documentation

## 2023-12-19 DIAGNOSIS — Z800 Family history of malignant neoplasm of digestive organs: Secondary | ICD-10-CM | POA: Diagnosis not present

## 2023-12-19 DIAGNOSIS — C50411 Malignant neoplasm of upper-outer quadrant of right female breast: Secondary | ICD-10-CM | POA: Diagnosis present

## 2023-12-19 DIAGNOSIS — Z809 Family history of malignant neoplasm, unspecified: Secondary | ICD-10-CM | POA: Insufficient documentation

## 2023-12-19 DIAGNOSIS — Z79810 Long term (current) use of selective estrogen receptor modulators (SERMs): Secondary | ICD-10-CM | POA: Diagnosis not present

## 2023-12-19 NOTE — Assessment & Plan Note (Addendum)
 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  Assessment and Plan Assessment & Plan Breast cancer Concerns about scar tissue in the right breast. Not on antiestrogen therapy due to side effect concerns  -Given early stage breast cancer, advanced age, co-morbidities and memory impairment, family felt they would want her to take many medications. Pt also agrees.- I think this is reasonable. I did recommend annual mammograms and she is agreeable. - Schedule annual breast exam. - She will alternate between me and Dr Cherlynn Cornfield.

## 2023-12-19 NOTE — Progress Notes (Signed)
 [facility] CONSULT NOTE  Patient Care Team: Victorio Grave, MD as PCP - General (Family Medicine) Lockie Rima, MD as Consulting Physician (General Surgery) Murleen Arms, MD as Consulting Physician (Hematology and Oncology) Colie Dawes, MD as Attending Physician (Radiation Oncology)  CHIEF COMPLAINTS/PURPOSE OF CONSULTATION:  Newly diagnosed breast cancer  HISTORY OF PRESENTING ILLNESS:  Jasmine Cantu 83 y.o. female is here because of recent diagnosis of right breast cancer  I reviewed her records extensively and collaborated the history with the patient.  SUMMARY OF ONCOLOGIC HISTORY: Oncology History  Malignant neoplasm of upper-outer quadrant of right breast in female, estrogen receptor positive (HCC)  12/20/2022 Mammogram   Possible mass in the right breast noted on screening mammogram.Highly suspicious irregular mass in the right breast at 11 o'clock. No axillary adenopathy. Surrounding fibrocystic changes. Recommend ultrasound-guided biopsy of the 11 o'clock right breast mass.   01/10/2023 Pathology Results   Right breast needle core biopsy at 11 0 clock 2 cm from nipple show grade 1 IDC, focal DCIS, cribriform type. ER pos 100% strong staining, PR positive 100% strong staining intensity Ki 67 5%   01/28/2023 Surgery   Right breast lumpectomy: IDC, 1.3 cm, grade 1, margins negative, 0 SLN biopsied   01/28/2023 Cancer Staging   Staging form: Breast, AJCC 8th Edition - Pathologic stage from 01/28/2023: Stage IA (pT1c, pN0, cM0, G1, ER+, PR+, HER2-) - Signed by Murleen Arms, MD on 12/19/2023 Stage prefix: Initial diagnosis Method of lymph node assessment: Clinical Multigene prognostic tests performed: None Histologic grading system: 3 grade system   03/07/2023 -  Anti-estrogen oral therapy   20 mg Tamoxifen  daily      She had right breast lumpectomy which showed a grade 1 1.3 cm IDC, DCIS, all margins negative, prognostic showed ER 100% strong  staining PR 100% strong staining, HER2 negative, Ki-67 of 5%  History of Present Illness  History of Present Illness Jasmine Cantu is an 83 year old female with a history of small vessel disease and breast cancer who presents for follow-up regarding her breast cancer  She has a history of recurrent infections requiring antibiotic treatment, often needing two antibiotics, with vancomycin  administered seven days after the initial course. Following a recent hospital visit in January, she stopped certain medications, which improved her bowel issues. She has a history of C difficile infection, which recurred once before her last hospitalization. She experienced delirium during her hospital stay.  She has irritable bowel syndrome and a history of Yersinia infection, contributing to her gastrointestinal issues. Her current medications include aspirin , a probiotic (Culturelle), benazepril  5 mg (possibly taking two tablets), and B12 injections every three weeks. She previously took tamoxifen  for breast cancer but has since stopped.  She is concerned about scar tissue in her right breast, described as hard. She manages this with compresses and exercises. No new lumps or lymph node issues are noted in the right breast. She is scheduled for a mammogram on Monday and has regular follow-ups with her healthcare providers.  She has small vessel disease, confirmed by an MRI of the brain, and chronic dementia. Her brother assists with daily blood pressure monitoring and ensures she maintains a good diet. She has lost weight over the past year, dropping from 138 pounds to 125 pounds, with no significant weight loss beyond the 13 pounds over the past year.  Rest of the pertinent 10 point ROS reviewed and neg.  MEDICAL HISTORY:  Past Medical History:  Diagnosis Date   Allergy  Anemia    C. difficile diarrhea    Cataract    Collagenous colitis 2004   Diverticulosis    Esophageal stricture 1988   GERD  (gastroesophageal reflux disease)    H. pylori infection    Hiatal hernia    Hypertension    IBS (irritable bowel syndrome)    Iron deficiency anemia    Tubular adenoma of colon    Ulcer    Vitamin B12 deficiency     SURGICAL HISTORY: Past Surgical History:  Procedure Laterality Date   APPENDECTOMY     BLADDER SUSPENSION     BREAST BIOPSY Right 01/10/2023   US  RT BREAST BX W LOC DEV 1ST LESION IMG BX SPEC US  GUIDE 01/10/2023 [facility]   BREAST BIOPSY  01/25/2023   US  RT RADIOACTIVE SEED LOC 01/25/2023 [facility]   BREAST LUMPECTOMY WITH RADIOACTIVE SEED LOCALIZATION Right 01/28/2023   Procedure: RIGHT BREAST LUMPECTOMY WITH RADIOACTIVE SEED LOCALIZATION;  Surgeon: Lockie Rima, MD;  Location: [facility];  Service: General;  Laterality: Right;   CHOLECYSTECTOMY     EYE SURGERY     LAPAROTOMY N/A 01/08/2022   Procedure: EXPLORATORY LAPAROTOMY, SMALL BOWEL RESECTION;  Surgeon: Enid Harry, MD;  Location: [facility];  Service: General;  Laterality: N/A;   VAGINAL HYSTERECTOMY      SOCIAL HISTORY: Social History   Socioeconomic History   Marital status: Single    Spouse name: Not on file   Number of children: Not on file   Years of education: Not on file   Highest education level: Not on file  Occupational History   Not on file  Tobacco Use   Smoking status: Never   Smokeless tobacco: Never  Vaping Use   Vaping status: Never Used  Substance and Sexual Activity   Alcohol use: No   Drug use: No   Sexual activity: Yes    Birth control/protection: Surgical  Other Topics Concern   Not on file  Social History Narrative   Not on file   Social Drivers of Health   Financial Resource Strain: Not on file  Food Insecurity: No Food Insecurity (08/05/2023)   Hunger Vital Sign    Worried About Running Out of Food in the Last Year: Never true    Ran Out of Food in the Last Year: Never true  Transportation Needs: No Transportation Needs (08/05/2023)    PRAPARE - Administrator, Civil Service (Medical): No    Lack of Transportation (Non-Medical): No  Physical Activity: Not on file  Stress: Not on file  Social Connections: Moderately Isolated (08/05/2023)   Social Connection and Isolation Panel [NHANES]    Frequency of Communication with Friends and Family: More than three times a week    Frequency of Social Gatherings with Friends and Family: Twice a week    Attends Religious Services: 1 to 4 times per year    Active Member of [employer] or Organizations: No    Attends Banker Meetings: Never    Marital Status: Widowed  Intimate Partner Violence: Not At Risk (08/05/2023)   Humiliation, Afraid, Rape, and Kick questionnaire    Fear of Current or Ex-Partner: No    Emotionally Abused: No    Physically Abused: No    Sexually Abused: No    FAMILY HISTORY: Family History  Problem Relation Age of Onset   Diabetes Mother    Colon cancer Mother    Stroke Father    Cancer Sister  Cancer Brother    Heart attack Brother    Cancer Brother    Heart attack Brother     ALLERGIES:  is allergic to doxycycline , codeine, and hydrocodone .  MEDICATIONS:  Current Outpatient Medications  Medication Sig Dispense Refill   benazepril  (LOTENSIN ) 5 MG tablet Take 5 mg by mouth daily.     acetaminophen  (TYLENOL ) 500 MG tablet Take 500 mg by mouth every 6 (six) hours as needed for mild pain.     aspirin  EC 81 MG tablet Take 81 mg by mouth daily. Swallow whole.     cyanocobalamin (,VITAMIN B-12,) 1000 MCG/ML injection Inject 1,000 mcg into the muscle every 30 (thirty) days.     hydrocortisone (ANUSOL-HC) 2.5 % rectal cream Apply 1 Application topically as needed for hemorrhoids or anal itching.     Probiotic Product (PROBIOTIC BLEND PO) Take 1 capsule by mouth daily.     No current facility-administered medications for this visit.    PHYSICAL EXAMINATION: ECOG PERFORMANCE STATUS: 0 - Asymptomatic  Vitals:   12/19/23 0856  12/19/23 0857  BP: (!) 157/49 (!) 142/52  Resp: 18   Temp: 98 F (36.7 C)   SpO2: 100%    Filed Weights   12/19/23 0856  Weight: 125 lb 6.4 oz (56.9 kg)    GENERAL:alert, no distress and comfortable Right breast healing well.  No concern for infection.  LABORATORY DATA:  I have reviewed the data as listed Lab Results  Component Value Date   WBC 3.7 (L) 08/08/2023   HGB 9.3 (L) 08/08/2023   HCT 28.5 (L) 08/08/2023   MCV 87.4 08/08/2023   PLT 146 (L) 08/08/2023   Lab Results  Component Value Date   NA 135 08/08/2023   K 3.8 08/08/2023   CL 108 08/08/2023   CO2 20 (L) 08/08/2023    RADIOGRAPHIC STUDIES: I have personally reviewed the radiological reports and agreed with the findings in the report.  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  Assessment and Plan Assessment & Plan Breast cancer Concerns about scar tissue in the right breast. Not on antiestrogen therapy due to side effect concerns  -Given early stage breast cancer, advanced age, co-morbidities and memory impairment, family felt they would want her to take many medications. Pt also agrees.- I think this is reasonable. I did recommend annual mammograms and she is agreeable. - Schedule annual breast exam. - She will alternate between me and Dr Cherlynn Cornfield.   Time spent: 20 min  All questions were answered. The patient knows to call the clinic with any problems, questions or concerns.    Murleen Arms, MD 12/19/23

## 2023-12-23 ENCOUNTER — Ambulatory Visit
Admission: RE | Admit: 2023-12-23 | Discharge: 2023-12-23 | Disposition: A | Discharge status: Home / Self Care | Payer: Medicare Other | Source: Ambulatory Visit | Attending: Adult Health | Admitting: Adult Health

## 2023-12-23 DIAGNOSIS — Z170 Estrogen receptor positive status [ER+]: Secondary | ICD-10-CM

## 2024-12-18 ENCOUNTER — Ambulatory Visit: Admitting: Hematology and Oncology
